# Patient Record
Sex: Male | Born: 1955
Health system: Southern US, Community
[De-identification: ages and names within clinical notes are randomized; demographics above are authoritative.]

## PROBLEM LIST (undated history)

## (undated) DIAGNOSIS — E785 Hyperlipidemia, unspecified: Secondary | ICD-10-CM

## (undated) DIAGNOSIS — G479 Sleep disorder, unspecified: Secondary | ICD-10-CM

## (undated) DIAGNOSIS — R7303 Prediabetes: Secondary | ICD-10-CM

## (undated) DIAGNOSIS — R0602 Shortness of breath: Secondary | ICD-10-CM

## (undated) DIAGNOSIS — Z87448 Personal history of other diseases of urinary system: Secondary | ICD-10-CM

## (undated) DIAGNOSIS — Z8719 Personal history of other diseases of the digestive system: Secondary | ICD-10-CM

## (undated) DIAGNOSIS — M549 Dorsalgia, unspecified: Secondary | ICD-10-CM

## (undated) DIAGNOSIS — M2550 Pain in unspecified joint: Secondary | ICD-10-CM

## (undated) DIAGNOSIS — K5900 Constipation, unspecified: Secondary | ICD-10-CM

## (undated) DIAGNOSIS — I10 Essential (primary) hypertension: Secondary | ICD-10-CM

## (undated) DIAGNOSIS — M1990 Unspecified osteoarthritis, unspecified site: Secondary | ICD-10-CM

## (undated) DIAGNOSIS — M5430 Sciatica, unspecified side: Secondary | ICD-10-CM

## (undated) HISTORY — PX: TOTAL HIP ARTHROPLASTY: SHX124

## (undated) HISTORY — DX: Constipation, unspecified: K59.00

## (undated) HISTORY — DX: Personal history of other diseases of urinary system: Z87.448

## (undated) HISTORY — DX: Sciatica, unspecified side: M54.30

## (undated) HISTORY — DX: Dorsalgia, unspecified: M54.9

## (undated) HISTORY — DX: Shortness of breath: R06.02

## (undated) HISTORY — PX: ROTATOR CUFF REPAIR: SHX139

## (undated) HISTORY — DX: Hyperlipidemia, unspecified: E78.5

## (undated) HISTORY — PX: VASECTOMY: SHX75

## (undated) HISTORY — DX: Pain in unspecified joint: M25.50

## (undated) HISTORY — DX: Prediabetes: R73.03

## (undated) HISTORY — DX: Essential (primary) hypertension: I10

---
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
## 2004-03-23 ENCOUNTER — Ambulatory Visit: Payer: Self-pay | Admitting: Internal Medicine

## 2004-03-31 ENCOUNTER — Ambulatory Visit: Payer: Self-pay | Admitting: Internal Medicine

## 2005-01-15 ENCOUNTER — Ambulatory Visit: Payer: Self-pay | Admitting: Internal Medicine

## 2005-04-24 ENCOUNTER — Ambulatory Visit (HOSPITAL_COMMUNITY): Admission: RE | Admit: 2005-04-24 | Discharge: 2005-04-24 | Payer: Self-pay | Admitting: Orthopedic Surgery

## 2005-05-10 ENCOUNTER — Ambulatory Visit: Payer: Self-pay | Admitting: Internal Medicine

## 2005-05-14 ENCOUNTER — Ambulatory Visit: Payer: Self-pay | Admitting: Internal Medicine

## 2006-09-12 ENCOUNTER — Ambulatory Visit: Payer: Self-pay | Admitting: Internal Medicine

## 2006-09-12 LAB — CONVERTED CEMR LAB
Albumin: 4.3 g/dL (ref 3.5–5.2)
Alkaline Phosphatase: 37 units/L — ABNORMAL LOW (ref 39–117)
Basophils Absolute: 0 10*3/uL (ref 0.0–0.1)
Basophils Relative: 0.7 % (ref 0.0–1.0)
Calcium: 9.4 mg/dL (ref 8.4–10.5)
Cholesterol: 305 mg/dL (ref 0–200)
Creatinine, Ser: 1.1 mg/dL (ref 0.4–1.5)
Eosinophils Absolute: 0.3 10*3/uL (ref 0.0–0.6)
Eosinophils Relative: 4.2 % (ref 0.0–5.0)
HCT: 45.1 % (ref 39.0–52.0)
Hemoglobin: 15.8 g/dL (ref 13.0–17.0)
MCV: 87.8 fL (ref 78.0–100.0)
Monocytes Absolute: 0.7 10*3/uL (ref 0.2–0.7)
Monocytes Relative: 10.7 % (ref 3.0–11.0)
Neutro Abs: 4.4 10*3/uL (ref 1.4–7.7)
Neutrophils Relative %: 64.4 % (ref 43.0–77.0)
PSA: 0.78 ng/mL (ref 0.10–4.00)
Potassium: 4.1 meq/L (ref 3.5–5.1)
Sodium: 139 meq/L (ref 135–145)
TSH: 2.13 microintl units/mL (ref 0.35–5.50)
Total Bilirubin: 1.5 mg/dL — ABNORMAL HIGH (ref 0.3–1.2)
Total CHOL/HDL Ratio: 7.6
Total Protein: 7.2 g/dL (ref 6.0–8.3)
VLDL: 65 mg/dL — ABNORMAL HIGH (ref 0–40)

## 2006-09-19 ENCOUNTER — Ambulatory Visit: Payer: Self-pay | Admitting: Internal Medicine

## 2006-10-18 ENCOUNTER — Ambulatory Visit: Payer: Self-pay | Admitting: Internal Medicine

## 2006-10-18 LAB — CONVERTED CEMR LAB
AST: 37 units/L (ref 0–37)
Cholesterol: 216 mg/dL (ref 0–200)
Creatinine, Ser: 1.1 mg/dL (ref 0.4–1.5)
HDL: 47.2 mg/dL (ref >39.0)
Total CHOL/HDL Ratio: 4.6
Triglycerides: 140 mg/dL (ref 0–149)

## 2006-12-17 ENCOUNTER — Encounter: Payer: Self-pay | Admitting: *Deleted

## 2006-12-17 DIAGNOSIS — G5600 Carpal tunnel syndrome, unspecified upper limb: Secondary | ICD-10-CM | POA: Insufficient documentation

## 2006-12-17 DIAGNOSIS — R454 Irritability and anger: Secondary | ICD-10-CM | POA: Insufficient documentation

## 2006-12-17 DIAGNOSIS — E785 Hyperlipidemia, unspecified: Secondary | ICD-10-CM | POA: Insufficient documentation

## 2006-12-17 DIAGNOSIS — I10 Essential (primary) hypertension: Secondary | ICD-10-CM | POA: Insufficient documentation

## 2007-09-07 ENCOUNTER — Telehealth: Payer: Self-pay | Admitting: Internal Medicine

## 2007-09-22 ENCOUNTER — Telehealth: Payer: Self-pay | Admitting: Internal Medicine

## 2007-09-25 ENCOUNTER — Telehealth: Payer: Self-pay | Admitting: Internal Medicine

## 2007-10-04 ENCOUNTER — Telehealth: Payer: Self-pay | Admitting: Internal Medicine

## 2007-10-12 ENCOUNTER — Ambulatory Visit: Payer: Self-pay | Admitting: Internal Medicine

## 2007-10-12 DIAGNOSIS — M5430 Sciatica, unspecified side: Secondary | ICD-10-CM

## 2007-10-12 HISTORY — DX: Sciatica, unspecified side: M54.30

## 2007-12-07 ENCOUNTER — Ambulatory Visit: Payer: Self-pay | Admitting: Internal Medicine

## 2007-12-07 ENCOUNTER — Telehealth: Payer: Self-pay | Admitting: Internal Medicine

## 2007-12-07 DIAGNOSIS — R079 Chest pain, unspecified: Secondary | ICD-10-CM | POA: Insufficient documentation

## 2007-12-11 ENCOUNTER — Encounter: Payer: Self-pay | Admitting: Internal Medicine

## 2007-12-11 ENCOUNTER — Ambulatory Visit: Payer: Self-pay

## 2007-12-14 ENCOUNTER — Telehealth: Payer: Self-pay | Admitting: Internal Medicine

## 2008-01-18 ENCOUNTER — Telehealth: Payer: Self-pay | Admitting: Internal Medicine

## 2008-01-18 ENCOUNTER — Ambulatory Visit: Payer: Self-pay | Admitting: Internal Medicine

## 2008-01-18 DIAGNOSIS — L02419 Cutaneous abscess of limb, unspecified: Secondary | ICD-10-CM | POA: Insufficient documentation

## 2008-01-18 DIAGNOSIS — L03119 Cellulitis of unspecified part of limb: Secondary | ICD-10-CM

## 2008-01-19 ENCOUNTER — Ambulatory Visit: Payer: Self-pay | Admitting: Internal Medicine

## 2008-01-21 ENCOUNTER — Encounter: Payer: Self-pay | Admitting: Internal Medicine

## 2008-01-25 ENCOUNTER — Telehealth: Payer: Self-pay | Admitting: Internal Medicine

## 2008-03-29 ENCOUNTER — Telehealth: Payer: Self-pay | Admitting: Internal Medicine

## 2008-04-08 ENCOUNTER — Telehealth: Payer: Self-pay | Admitting: Internal Medicine

## 2008-10-01 ENCOUNTER — Telehealth: Payer: Self-pay | Admitting: Internal Medicine

## 2008-11-05 ENCOUNTER — Telehealth: Payer: Self-pay | Admitting: Internal Medicine

## 2009-05-15 ENCOUNTER — Ambulatory Visit: Payer: Self-pay | Admitting: Internal Medicine

## 2009-06-09 ENCOUNTER — Telehealth: Payer: Self-pay | Admitting: Internal Medicine

## 2009-07-31 ENCOUNTER — Telehealth (INDEPENDENT_AMBULATORY_CARE_PROVIDER_SITE_OTHER): Payer: Self-pay | Admitting: *Deleted

## 2009-08-05 ENCOUNTER — Encounter: Payer: Self-pay | Admitting: Internal Medicine

## 2009-12-05 ENCOUNTER — Telehealth (INDEPENDENT_AMBULATORY_CARE_PROVIDER_SITE_OTHER): Payer: Self-pay | Admitting: *Deleted

## 2009-12-22 ENCOUNTER — Telehealth: Payer: Self-pay | Admitting: Internal Medicine

## 2010-03-18 ENCOUNTER — Encounter: Payer: Self-pay | Admitting: Internal Medicine

## 2010-03-18 ENCOUNTER — Ambulatory Visit (INDEPENDENT_AMBULATORY_CARE_PROVIDER_SITE_OTHER): Payer: BC Managed Care – PPO | Admitting: Internal Medicine

## 2010-03-18 DIAGNOSIS — M79609 Pain in unspecified limb: Secondary | ICD-10-CM | POA: Insufficient documentation

## 2010-03-18 NOTE — Progress Notes (Signed)
  Phone Note Refill Request Message from:  Patient on June 09, 2009 3:57 PM  Refills Requested: Medication #1:  CHANTIX STARTING MONTH PAK 0.5 MG X 11 & 1 MG X 42 TABS as directed. Pt wants to switch back to Crestor 40 mg  Initial call taken by: Ami Bullins CMA,  June 09, 2009 3:58 PM  Follow-up for Phone Call        he had the starter pak 3/31 and should now be on regular treatment dose of 1 mg two times a day, # 60 with 12 refills.  OK to switch back to crestor 40mg  once daily, #30 refill prn Follow-up by: Jacques Navy MD,  June 09, 2009 5:45 PM    New/Updated Medications: CRESTOR 40 MG TABS (ROSUVASTATIN CALCIUM) 1 by mouth qPM CHANTIX 1 MG TABS (VARENICLINE TARTRATE) Take 1 tablet by mouth two times a day Prescriptions: CHANTIX 1 MG TABS (VARENICLINE TARTRATE) Take 1 tablet by mouth two times a day  #60 x 12   Entered by:   Rock Nephew CMA   Authorized by:   Jacques Navy MD   Signed by:   Rock Nephew CMA on 06/10/2009   Method used:   Electronically to        [facility] (retail)       [address] [place], [place]  [address]       Ph: [phone]       Fax: [phone]   RxID:   [id number] CRESTOR 40 MG TABS (ROSUVASTATIN CALCIUM) 1 by mouth qPM  #30 x 12   Entered by:   Rock Nephew CMA   Authorized by:   Jacques Navy MD   Signed by:   Rock Nephew CMA on 06/10/2009   Method used:   Electronically to        [facility] (retail)       [address] [place], [place]  [address]       Ph: [phone]       Fax: [phone]   RxID:   [id number]

## 2010-03-18 NOTE — Progress Notes (Signed)
  Phone Note Other Incoming   Request: Send information Summary of Call: Request for records received from [facility]. Request forwarded to [facility].     

## 2010-03-18 NOTE — Progress Notes (Signed)
  Phone Note Other Incoming   Request: Send information Summary of Call: Request for records received from [facility]. Request forwarded to [facility].

## 2010-03-18 NOTE — Assessment & Plan Note (Signed)
Summary: FU-D/T-STC   Vital Signs:  Patient profile:   55 year old male Height:      69 inches (175.26 cm) Weight:      197.25 pounds (89.66 kg) BMI:     29.23 O2 Sat:      98 % on Room air Temp:     99.0 degrees F (37.22 degrees C) oral Pulse rate:   78 / minute Pulse rhythm:   regular BP sitting:   124 / 76  (left arm) Cuff size:   regular  Vitals Entered By: Bill Salinas CMA (May 15, 2009 12:09 PM)  O2 Flow:  Room air CC: pt here to discuss medications/wants to ask about setting up DOT phy/blood work/aj   Primary Care Provider:  Jacques Navy MD  CC:  pt here to discuss medications/wants to ask about setting up DOT phy/blood work/aj.  History of Present Illness: Wants to discuss his medications: would like generics where-ever possible. 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   He has no complaints: no respiratory, cardiac or GI symptoms or problems. He is still struggling with smoking cessation.   Current Medications (verified): 1)  Wellbutrin Sr 150 Mg  Tb12 (Bupropion Hcl) .... Take 2 Tablets Daily 2)  Alprazolam 1 Mg  Tabs (Alprazolam) .... Take One Tablet At Bedtime(Needs Office Visit Soon, Before Next Approval) 3)  Hydrochlorothiazide 50 Mg  Tabs (Hydrochlorothiazide) .... Take 1/2 Tablet Daily 4)  Enalapril Maleate 20 Mg  Tabs (Enalapril Maleate) .... Take One Tablet Once Daily 5)  Crestor 40 Mg Tabs (Rosuvastatin Calcium) .... 1/2 Once Daily 6)  Nitrostat 0.4 Mg Subl (Nitroglycerin) .Marland Kitchen.. 1 Sl Q 2 Min For Chest Pain 7)  Bactrim Ds 800-160 Mg Tabs (Sulfamethoxazole-Trimethoprim) .... Take 1 Tablet By Mouth Two Times A Day For 21 Days 8)  Lortab 7.5 7.5-500 Mg Tabs (Hydrocodone-Acetaminophen) .... One By Mouth Q6 Hours As Needed Pain  Allergies (verified): No Known Drug Allergies  Past History:  Past Medical History: Last  updated: 12/17/2006 ANXIETY (ICD-300.00) Hx of PARESTHESIA (ICD-782.0) HYPERTENSION (ICD-401.9) HYPERLIPIDEMIA (ICD-272.4)    Past Surgical History: Last updated: 12/17/2006 VASECTOMY, HX OF (ICD-V26.52)  Social History: Last updated: 01/18/2008 Divorced Works in Games developer Alcohol use-no Drug use-no Regular exercise-yes Current Smoker  Review of Systems  The patient denies anorexia, fever, weight loss, weight gain, chest pain, dyspnea on exertion, prolonged cough, abdominal pain, severe indigestion/heartburn, incontinence, muscle weakness, difficulty walking, unusual weight change, and enlarged lymph nodes.    Physical Exam  General:  alert, well-developed, well-nourished, and well-hydrated.   Head:  normocephalic and atraumatic.   Eyes:  corneas and lenses clear and no injection.   Neck:  supple.   Lungs:  normal respiratory effort and normal breath sounds.   Heart:  normal rate and regular rhythm.   Msk:  normal ROM and no joint deformities.   Pulses:  2+ radial Neurologic:  alert & oriented X3 and cranial nerves II-XII intact.   Skin:  turgor normal, color normal, and no rashes.   Psych:  Oriented X3, memory intact for recent and remote, normally interactive, and good eye contact.     Impression & Recommendations:  Problem # 1:  ANXIETY (ICD-300.00) He did not see any benefit is taking wellbutrin and had discontinued medication . He feels he is doing well  with alprazolam.  The following medications were removed from the medication list:    Wellbutrin Sr 150 Mg Tb12 (Bupropion hcl) .Marland Kitchen... Take 2 tablets daily His updated medication list for this problem includes:    Alprazolam 1 Mg Tabs (Alprazolam) .Marland Kitchen... Take one tablet at bedtime(needs office visit soon, before next approval)  Problem # 2:  HYPERTENSION (ICD-401.9)  His updated medication list for this problem includes:    Hydrochlorothiazide 50 Mg Tabs (Hydrochlorothiazide) .Marland Kitchen... Take 1/2 tablet  daily    Enalapril Maleate 20 Mg Tabs (Enalapril maleate) .Marland Kitchen... Take one tablet once daily  BP today: 124/76 Prior BP: 106/70 (01/19/2008)  Good control on present medications, which are generics.  Problem # 3:  HYPERLIPIDEMIA (ICD-272.4) Has a h/o very cholesterol. He has been off medication for some time.  Plan - resume statin therapy with Lipitor           follow-up lab in 4 weeks.   His updated medication list for this problem includes:    Lipitor 40 Mg Tabs (Atorvastatin calcium) .Marland Kitchen... 1 by mouth q pm  Problem # 4:  Preventive Health Care (ICD-V70.0) Seems to be in good health. He will get a DOT exam through the driving school - which will not include a prostate exam.   Complete Medication List: 1)  Alprazolam 1 Mg Tabs (Alprazolam) .... Take one tablet at bedtime(needs office visit soon, before next approval) 2)  Hydrochlorothiazide 50 Mg Tabs (Hydrochlorothiazide) .... Take 1/2 tablet daily 3)  Enalapril Maleate 20 Mg Tabs (Enalapril maleate) .... Take one tablet once daily 4)  Lipitor 40 Mg Tabs (Atorvastatin calcium) .Marland Kitchen.. 1 by mouth q pm 5)  Nitrostat 0.4 Mg Subl (Nitroglycerin) .Marland Kitchen.. 1 sl q 2 min for chest pain 6)  Chantix Starting Month Pak 0.5 Mg X 11 & 1 Mg X 42 Tabs (Varenicline tartrate) .... As directed Prescriptions: ALPRAZOLAM 1 MG  TABS (ALPRAZOLAM) Take one tablet at bedtime(needs office visit soon, before next approval)  #30 x 5   Entered and Authorized by:   Jacques Navy MD   Signed by:   Jacques Navy MD on 05/15/2009   Method used:   Print then Give to Patient   RxID:   [id number] NITROSTAT 0.4 MG SUBL (NITROGLYCERIN) 1 sl q 2 min for chest pain  #20 x 2   Entered and Authorized by:   Jacques Navy MD   Signed by:   Jacques Navy MD on 05/15/2009   Method used:   Electronically to        [facility] (retail)       [address] [place], [place]  [address]       Ph: [phone]       Fax:  [phone]   RxID:   [id number] ENALAPRIL MALEATE 20 MG  TABS (ENALAPRIL MALEATE) Take one tablet once daily  #30 Tablet x 12   Entered and Authorized by:   Jacques Navy MD   Signed by:   Jacques Navy MD on 05/15/2009   Method used:   Electronically to        [facility] (retail)       [address] [place], [place]  [address]       Ph: [phone]  Fax: [phone]   RxID:   [id number] HYDROCHLOROTHIAZIDE 50 MG  TABS (HYDROCHLOROTHIAZIDE) Take 1/2 tablet daily  #100 Tablet x 2   Entered and Authorized by:   Jacques Navy MD   Signed by:   Jacques Navy MD on 05/15/2009   Method used:   Electronically to        [facility] (retail)       [address] [place], [place]  [address]       Ph: [phone]       Fax: [phone]   RxID:   [id number] CHANTIX STARTING MONTH PAK 0.5 MG X 11 & 1 MG X 42 TABS (VARENICLINE TARTRATE) as directed  #1 x 0   Entered and Authorized by:   Jacques Navy MD   Signed by:   Jacques Navy MD on 05/15/2009   Method used:   Electronically to        [facility] (retail)       [address] [place], [place]  [address]       Ph: [phone]       Fax: [phone]   RxID:   [id number] LIPITOR 40 MG TABS (ATORVASTATIN CALCIUM) 1 by mouth q PM  #30 x 12   Entered and Authorized by:   Jacques Navy MD   Signed by:   Jacques Navy MD on 05/15/2009   Method used:   Electronically to        [facility] (retail)       [address] [place], [place]  [address]       Ph: [phone]       Fax: [phone]   RxID:   [id number]

## 2010-03-18 NOTE — Progress Notes (Signed)
  Phone Note Refill Request Message from:  Fax from Pharmacy on December 22, 2009 1:52 PM  Refills Requested: Medication #1:  ALPRAZOLAM 1 MG  TABS Take one tablet at bedtime(needs office visit soon   Last Refilled: 11/08/2009 received refill request from [facility]. Please Advise refills  Initial call taken by: Ami Bullins CMA,  December 22, 2009 1:53 PM  Follow-up for Phone Call        ok to refill x 5 Follow-up by: Jacques Navy MD,  December 22, 2009 2:10 PM    Prescriptions: ALPRAZOLAM 1 MG  TABS (ALPRAZOLAM) Take one tablet at bedtime(needs office visit soon, before next approval)  #30 x 5   Entered by:   Ami Bullins CMA   Authorized by:   Jacques Navy MD   Signed by:   Bill Salinas CMA on 12/22/2009   Method used:   Telephoned to ...       [facility] [phone]* (retail)       [address] [place], [place]  [address]       Ph: [phone]       Fax: [phone]   RxID:   [id number]

## 2010-03-20 ENCOUNTER — Encounter: Payer: Self-pay | Admitting: Internal Medicine

## 2010-03-25 NOTE — Assessment & Plan Note (Signed)
Summary: surgery clearance per pt--d/t---stc   Vital Signs:  Patient profile:   55 year old male Height:      69 inches Weight:      199 pounds BMI:     29.49 O2 Sat:      95 % on Room air Temp:     98.4 degrees F oral Pulse rate:   80 / minute BP sitting:   118 / 72  (left arm) Cuff size:   regular  Vitals Entered By: Bill Salinas CMA (March 18, 2010 9:15 AM)  O2 Flow:  Room air CC: pt here for surgical clearance/ab   Primary Care Provider:  Jacques Navy MD  CC:  pt here for surgical clearance/ab.  History of Present Illness: Patient presents for possible THR right. He has  been having a lot of leg pain, knee pain and foot pain. He has also had  back pain. He had x-rays of back and hips which have revealed DJD right hip with loss of joint space and DDD of the lumbar. He knows he has changed his gait. He has limited his activities. He is not in a hurry to have THR.   Current Medications (verified): 1)  Alprazolam 1 Mg  Tabs (Alprazolam) .... Take One Tablet At Bedtime(Needs Office Visit Soon, Before Next Approval) 2)  Hydrochlorothiazide 50 Mg  Tabs (Hydrochlorothiazide) .... Take 1/2 Tablet Daily 3)  Enalapril Maleate 20 Mg  Tabs (Enalapril Maleate) .... Take One Tablet Once Daily 4)  Crestor 40 Mg Tabs (Rosuvastatin Calcium) .Marland Kitchen.. 1 By Mouth Qpm 5)  Nitrostat 0.4 Mg Subl (Nitroglycerin) .Marland Kitchen.. 1 Sl Q 2 Min For Chest Pain  Allergies (verified): 1)  ! 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    Physical Exam  General:  Well-developed,well-nourished,in no acute distress; alert,appropriate and cooperative throughout examination Head:  Normocephalic and atraumatic without obvious abnormalities. No apparent alopecia or balding. Eyes:  No corneal or conjunctival inflammation noted. EOMI. Perrla. Funduscopic exam benign, without hemorrhages, exudates or papilledema. Vision grossly normal. Ears:  cerumen  bilaterally. Mouth:  Oral mucosa and oropharynx without lesions or exudates.  Teeth in good repair. Neck:  supple, full ROM, no masses, no thyromegaly, and no carotid bruits.   Chest Wall:  No deformities, masses, tenderness or gynecomastia noted. Lungs:  Normal respiratory effort, chest expands symmetrically. Lungs are clear to auscultation, no crackles or wheezes. Heart:  Normal rate and regular rhythm. S1 and S2 normal without gallop, murmur, click, rub or other extra sounds. Abdomen:  soft and normal bowel sounds.   Msk:  able to get up to exam without assistance. painful to cross legs. Nl SLR supine. Nl DTRs at patellar tendon. Tenderness with external rotation of the right hip. Some tenderness at the extreme of internal rotation. Pain with pressure agaist the greater trochanter. Very tender at the psoas tendon right.  Pulses:  2+ radial Neurologic:  alert & oriented X3 and cranial nerves II-XII intact.   Skin:  turgor normal and  color normal.   Psych:  Oriented X3, memory intact for recent and remote, normally interactive, and good eye contact.     Impression & Recommendations:  Problem # 1:  LEG PAIN, RIGHT (ICD-729.5) Patient reports DJD by x-ray of the right hip. Also reports that he has DDD lumbar spine. He has great pain in the leg: quads, psoas tendon. Exam does not suggest  radiculopathy related to DDD lumbar spine. He does have signs of hip disease but also piriformis syndrome, psoas tendonopathy and inflammation of the quads.  Plan - directed to review videos on YouTube for sciatica, piriformis syndrome        provided referral slip to [facility]       Keep appointment with Dr. Charlann Boxer for full evaluation and suitability for ESI, trochanteric steroids or possible THR.  Problem # 2:  HYPERTENSION (ICD-401.9)  His updated medication list for this problem includes:    Hydrochlorothiazide 50 Mg Tabs (Hydrochlorothiazide) .Marland Kitchen... Take 1/2 tablet daily    Enalapril Maleate 20 Mg Tabs (Enalapril maleate) .Marland Kitchen... Take one tablet once daily  BP today: 118/72 Prior BP: 124/76 (05/15/2009)  Great control on present meds. OUtside labs are ok.   Problem # 3:  HYPERLIPIDEMIA (ICD-272.4) Outside labs with cholesterol of 212; HDL 67, LDL 129  Plan - continue crestor  His updated medication list for this problem includes:    Crestor 40 Mg Tabs (Rosuvastatin calcium) .Marland Kitchen... 1 by mouth qpm  Problem # 4:  Preventive Health Care (ICD-V70.0) History unremarkable except for leg pain. Lab outside with A1C 5.4%; nl renal function and liver functions. 12 lead EKG without signs of ischemia.   He is cleared for any potential surgery or vigorous PT  Complete Medication List: 1)  Alprazolam 1 Mg Tabs (Alprazolam) .... Take one tablet at bedtime(needs office visit soon, before next approval) 2)  Hydrochlorothiazide 50 Mg Tabs (Hydrochlorothiazide) .... Take 1/2 tablet daily 3)  Enalapril Maleate 20 Mg Tabs (Enalapril maleate) .... Take one tablet once daily 4)  Crestor 40 Mg Tabs (Rosuvastatin calcium) .Marland Kitchen.. 1 by mouth qpm 5)  Nitrostat 0.4 Mg Subl (Nitroglycerin) .Marland Kitchen.. 1 sl q 2 min for chest pain   Orders Added: 1)  Est. Patient Level IV [[address]]

## 2010-04-08 NOTE — Consult Note (Signed)
Summary: [facility]   Imported By: Sherian Rein 04/01/2010 07:51:52  _____________________________________________________________________  External Attachment:    Type:   Image     Comment:   External Document

## 2010-05-12 ENCOUNTER — Encounter (HOSPITAL_COMMUNITY): Payer: BC Managed Care – PPO

## 2010-05-12 ENCOUNTER — Other Ambulatory Visit: Payer: Self-pay | Admitting: Orthopedic Surgery

## 2010-05-12 ENCOUNTER — Other Ambulatory Visit (HOSPITAL_COMMUNITY): Payer: Self-pay | Admitting: Orthopedic Surgery

## 2010-05-12 ENCOUNTER — Ambulatory Visit (HOSPITAL_COMMUNITY)
Admission: RE | Admit: 2010-05-12 | Discharge: 2010-05-12 | Disposition: A | Discharge status: Home / Self Care | Payer: BC Managed Care – PPO | Source: Ambulatory Visit | Attending: Orthopedic Surgery | Admitting: Orthopedic Surgery

## 2010-05-12 DIAGNOSIS — Z01811 Encounter for preprocedural respiratory examination: Secondary | ICD-10-CM | POA: Insufficient documentation

## 2010-05-12 DIAGNOSIS — I10 Essential (primary) hypertension: Secondary | ICD-10-CM

## 2010-05-12 DIAGNOSIS — M169 Osteoarthritis of hip, unspecified: Secondary | ICD-10-CM | POA: Insufficient documentation

## 2010-05-12 DIAGNOSIS — F17200 Nicotine dependence, unspecified, uncomplicated: Secondary | ICD-10-CM | POA: Insufficient documentation

## 2010-05-12 DIAGNOSIS — M1610 Unilateral primary osteoarthritis, unspecified hip: Secondary | ICD-10-CM | POA: Insufficient documentation

## 2010-05-12 DIAGNOSIS — Z01818 Encounter for other preprocedural examination: Secondary | ICD-10-CM | POA: Insufficient documentation

## 2010-05-12 DIAGNOSIS — Z01812 Encounter for preprocedural laboratory examination: Secondary | ICD-10-CM | POA: Insufficient documentation

## 2010-05-12 DIAGNOSIS — M1990 Unspecified osteoarthritis, unspecified site: Secondary | ICD-10-CM

## 2010-05-12 LAB — DIFFERENTIAL
Basophils Absolute: 0 10*3/uL (ref 0.0–0.1)
Basophils Relative: 0 % (ref 0–1)
Eosinophils Absolute: 0.2 10*3/uL (ref 0.0–0.7)
Eosinophils Relative: 3 % (ref 0–5)
Lymphs Abs: 1.4 10*3/uL (ref 0.7–4.0)
Monocytes Absolute: 0.6 10*3/uL (ref 0.1–1.0)
Neutro Abs: 4 10*3/uL (ref 1.7–7.7)

## 2010-05-12 LAB — URINALYSIS, ROUTINE W REFLEX MICROSCOPIC
Bilirubin Urine: NEGATIVE
Ketones, ur: NEGATIVE mg/dL
Protein, ur: NEGATIVE mg/dL
Specific Gravity, Urine: 1.012 (ref 1.005–1.030)
Urobilinogen, UA: 0.2 mg/dL (ref 0.0–1.0)

## 2010-05-12 LAB — BASIC METABOLIC PANEL
Calcium: 9.6 mg/dL (ref 8.4–10.5)
Chloride: 104 mEq/L (ref 96–112)
Creatinine, Ser: 0.91 mg/dL (ref 0.4–1.5)
GFR calc non Af Amer: >60 mL/min (ref >60)
Potassium: 4.5 mEq/L (ref 3.5–5.1)

## 2010-05-12 LAB — PROTIME-INR: Prothrombin Time: 12.9 seconds (ref 11.6–15.2)

## 2010-05-12 LAB — CBC
HCT: 40.4 % (ref 39.0–52.0)
Hemoglobin: 13.9 g/dL (ref 13.0–17.0)
MCHC: 34.4 g/dL (ref 30.0–36.0)
MCV: 86.5 fL (ref 78.0–100.0)
RDW: 12.8 % (ref 11.5–15.5)

## 2010-05-12 LAB — SURGICAL PCR SCREEN
MRSA, PCR: NEGATIVE
Staphylococcus aureus: POSITIVE — AB

## 2010-05-18 NOTE — H&P (Signed)
NAME:  Eric Delgado, Eric Delgado NO.:  [id number]  MEDICAL RECORD NO.:  [id number]          PATIENT TYPE:  LOCATION:                                 FACILITY:  PHYSICIAN:  Madlyn Frankel. Charlann Boxer, M.D.  DATE OF BIRTH:  [birth year]  DATE OF ADMISSION: DATE OF DISCHARGE:                             HISTORY & PHYSICAL   CHIEF COMPLAINT:  Right hip osteoarthritis.  HISTORY OF PRESENT ILLNESS:  This is a 55 year old gentleman with a history of osteoarthritis of his right hip.  He is having pain and decreased range of motion and wishes to proceed with total hip arthroplasty.  After discussion of treatments, benefits, risks, and options, the patient is now for anterior total hip arthroplasty of the right hip.  He has no history of coronary problems and is a candidate for trans-cinnamic acid and will get that in the preop holding area.  PAST MEDICAL HISTORY:  Drug allergies to CODEINE with itching.  CURRENT MEDICATIONS: 1. Alprazolam 1 mg at bedtime p.r.n. 2. Hydrochlorothiazide 25 mg 1 daily. 3. Enalapril 20 mg 1 daily. 4. Crestor 20 mg 1 daily.  SERIOUS MEDICAL ILLNESSES:  Anxiety, hypertension, and hyperlipidemia.  PAST SURGICAL HISTORY:  Shoulder rotator cuff repair, appendectomy, and left thumb.  FAMILY HISTORY:  Positive for hypertension.  SOCIAL HISTORY:  The patient is divorced.  He lives at home.  He smokes 3-4 cigarettes a day and drinks 4 beers per day.  He works as a Corporate investment banker.  REVIEW OF SYSTEMS:  CENTRAL NERVOUS SYSTEM:  Negative for headache, blurred vision, or dizziness.  Positive for anxiety.  PULMONARY: Negative for shortness of breath, PND, or orthopnea.  CARDIOVASCULAR: Negative for heart attack, coronary artery disease.  Positive for hypertension and elevated cholesterol.  GI:  Negative for ulcers and hepatitis.  GU:  Negative for urinary tract difficulties. MUSCULOSKELETAL:  Positive as in HPI.  Note that, the patient has no chest pains and  no history of cardiac problems.  PHYSICAL EXAMINATION:  VITAL SIGNS:  BP 120/80, respirations 16, pulse 72 and regular. GENERAL:  This is a well-developed and well-nourished gentleman in no acute distress. HEENT:  Head normocephalic.  Nose patent.  Ears patent.  Pupils equal, round, and reactive to light.  Throat without injection. NECK:  Supple without adenopathy.  Carotids 2+ without bruit. CHEST:  Clear to auscultation.  No rales or rhonchi.  Respirations 16, heart regular rate and rhythm at 72 beats per minute without murmur. ABDOMEN:  Soft with active bowel sounds.  No masses or organomegaly. NEUROLOGIC:  The patient is alert and oriented to time, place, and person.  Cranial nerves II-XII grossly intact. EXTREMITIES:  The right hip with 0-95 degree range of motion, 35 degrees external rotation, 10 degrees of internal rotation with pain at the extremes of motion.  Neurovascular status is intact.  IMPRESSION:  Osteoarthritis, right hip.  PLAN:  Anterior total hip arthroplasty, right hip.     Jaquelyn Bitter. Chabon, P.A.   ______________________________ Madlyn Frankel Charlann Boxer, M.D.    SJC/MEDQ  D:  04/28/2010  T:  04/29/2010  Job:  [id number]  Electronically Signed by Jodene Nam  P.A. on 05/18/2010 09:55:23 AM Electronically Signed by Durene Romans M.D. on 05/18/2010 12:15:41 PM

## 2010-05-19 ENCOUNTER — Inpatient Hospital Stay (HOSPITAL_COMMUNITY): Payer: BC Managed Care – PPO

## 2010-05-19 ENCOUNTER — Inpatient Hospital Stay (HOSPITAL_COMMUNITY)
Admission: RE | Admit: 2010-05-19 | Discharge: 2010-05-21 | DRG: 818 | Disposition: A | Discharge status: Home Health | Payer: BC Managed Care – PPO | Source: Ambulatory Visit | Attending: Orthopedic Surgery | Admitting: Orthopedic Surgery

## 2010-05-19 DIAGNOSIS — M169 Osteoarthritis of hip, unspecified: Principal | ICD-10-CM | POA: Diagnosis present

## 2010-05-19 DIAGNOSIS — M1610 Unilateral primary osteoarthritis, unspecified hip: Principal | ICD-10-CM | POA: Diagnosis present

## 2010-05-19 DIAGNOSIS — F17200 Nicotine dependence, unspecified, uncomplicated: Secondary | ICD-10-CM | POA: Diagnosis present

## 2010-05-19 DIAGNOSIS — E7800 Pure hypercholesterolemia, unspecified: Secondary | ICD-10-CM | POA: Diagnosis present

## 2010-05-19 DIAGNOSIS — Z01812 Encounter for preprocedural laboratory examination: Secondary | ICD-10-CM

## 2010-05-19 DIAGNOSIS — F411 Generalized anxiety disorder: Secondary | ICD-10-CM | POA: Diagnosis present

## 2010-05-19 DIAGNOSIS — I10 Essential (primary) hypertension: Secondary | ICD-10-CM | POA: Diagnosis present

## 2010-05-19 LAB — ABO/RH: ABO/RH(D): A NEG

## 2010-05-19 LAB — TYPE AND SCREEN: Antibody Screen: NEGATIVE

## 2010-05-20 LAB — BASIC METABOLIC PANEL
BUN: 10 mg/dL (ref 6–23)
Chloride: 105 mEq/L (ref 96–112)
Creatinine, Ser: 0.97 mg/dL (ref 0.4–1.5)
Glucose, Bld: 119 mg/dL — ABNORMAL HIGH (ref 70–99)
Potassium: 4.3 mEq/L (ref 3.5–5.1)

## 2010-05-20 LAB — CBC
Hemoglobin: 10 g/dL — ABNORMAL LOW (ref 13.0–17.0)
MCH: 29.5 pg (ref 26.0–34.0)
MCHC: 33.9 g/dL (ref 30.0–36.0)
MCV: 87 fL (ref 78.0–100.0)
RDW: 12.9 % (ref 11.5–15.5)

## 2010-05-21 LAB — BASIC METABOLIC PANEL WITH GFR
BUN: 9 mg/dL (ref 6–23)
CO2: 27 meq/L (ref 19–32)
Calcium: 8.4 mg/dL (ref 8.4–10.5)
Chloride: 100 meq/L (ref 96–112)
Creatinine, Ser: 0.9 mg/dL (ref 0.4–1.5)
GFR calc non Af Amer: >60 mL/min
Glucose, Bld: 107 mg/dL — ABNORMAL HIGH (ref 70–99)
Potassium: 4 meq/L (ref 3.5–5.1)
Sodium: 134 meq/L — ABNORMAL LOW (ref 135–145)

## 2010-05-21 LAB — CBC
HCT: 29.8 % — ABNORMAL LOW (ref 39.0–52.0)
Hemoglobin: 10 g/dL — ABNORMAL LOW (ref 13.0–17.0)
MCH: 29.2 pg (ref 26.0–34.0)
MCHC: 33.6 g/dL (ref 30.0–36.0)
MCV: 86.9 fL (ref 78.0–100.0)
Platelets: 189 K/uL (ref 150–400)
RBC: 3.43 MIL/uL — ABNORMAL LOW (ref 4.22–5.81)
RDW: 13.1 % (ref 11.5–15.5)
WBC: 7.7 K/uL (ref 4.0–10.5)

## 2010-05-23 NOTE — Op Note (Signed)
NAME:  Eric Delgado, HUE NO.:  [id number]  MEDICAL RECORD NO.:  [id number]           PATIENT TYPE:  I  LOCATION:  0006                         FACILITY:  [facility]  PHYSICIAN:  Madlyn Frankel. Charlann Boxer, M.D.  DATE OF BIRTH:  05-08-1955  DATE OF PROCEDURE:  05/19/2010 DATE OF DISCHARGE:                              OPERATIVE REPORT   PREOPERATIVE DIAGNOSIS:  Right hip osteoarthritis.  POSTOPERATIVE DIAGNOSIS:  Right hip osteoarthritis.  PROCEDURE:  Right total-hip replacement.  COMPONENTS USED:  DePuy hip system, size 56 pinnacle cup, single cancellous screw, a 36 +4 neutral Altrex liner and a size 6 standard Trilock stem with a 36 +1.5 Delta ceramic ball.  SURGEON:  Madlyn Frankel. Charlann Boxer, M.D.  ASSISTANT:  Jaquelyn Bitter. Chabon, P.A.  ANESTHESIA:  Spinal.  SPECIMENS:  None.  COMPLICATIONS:  None.  DRAINS:  One Hemovac.  ESTIMATED BLOOD LOSS:  700 cc.  INDICATIONS FOR THE PROCEDURE:  [name] is a 55 year old male who presented to the office for evaluation of bilateral hip pain, right greater than the left.  Radiographs revealed end-stage degenerative changes.  After reviewing with him the fact that conservative measures would most likely not provide any significant relief and his quality of life was diminished due to his pain, he wished to proceed with joint arthroplasty.  The risks of infection, DVT, component failure, dislocation, and need for revision surgery were all discussed in this 16- year-old male.  Consent was obtained for a right total-hip replacement through an anterior approach.  PROCEDURE IN DETAIL:  The patient was brought to the operative theater. Once adequate anesthesia, preoperative antibiotics, Ancef administered, he was positioned supine on the [employer] table.  Once positioned adequately, the right hip was pre draped out and fluoroscopy brought in to identify landmarks and evaluate perioperatively.  At this point, the right hip was prepped and  draped in the sterile fashion from proximal iliac crest to mid-thigh area.  A time-out was performed identifying the patient, planned procedure and the extremity.  An incision was made 2 cm distal and lateral to the anterior-superior iliac spine and extending over the anterior aspect of the trochanter. The fascia of the tensor fascia lata was identified and soft-tissue planes created.  The fascia was incised and then the tensor fascia muscle elevated off of the undersurface of the fascia and swept laterally.  Retractors were placed superiorly.  At this point, the anterior circumflex vessels were identified and cauterized and pericapsular fat tissue was removed.  Following elevating the rectus femoris, retractors were placed inferior on the neck.  An L capsulotomy was then made in the superior aspect of the neck and carried to the trochanteric fossa down to the lesser trochanter.  Stay sutures were applied and the retractor placed intracapsular.  With the neck of the femur exposed, fluoroscopy was brought in to identify the orientation of the neck cut.  Once this was confirmed in location, traction was applied and a neck osteotomy made.  The femoral head was then removed without complication or damage to the muscle.  Traction was taken off and retractors were placed anterior and posterior.  Labrum and foveal tissue debrided.  I began reaming with a 48 reamer due to the size of the femoral head and reamed up to a 55 reamer under fluoroscopic imaging at times to confirm the orientation of reaming and position.  The final 56 pinnacle cup was chosen and impacted under the guidance of fluoroscopy to confirm its orientation in regard to abduction.  There was about 15 degrees of anteversion placed into this cup.  A single cancellus screw was placed, a hole eliminator placed, and the final 36 +4 neutral Altrex liner impacted.  At this point, the femur was rotated out about 80 degrees as I  removed some inferior capsular tissue off the neck.  The lateral hook was placed noting the mobility of the femur.  At this point, the femur was rotated to about 100 degrees and a medial retractor placed on the neck.  At this point, the proximal capsular tissue was opened up more to allow for retractor placement over the lateral trochanter.  With the proximal femur exposed and minor releases carried out to expose the proximal femur, box osteotome was inserted followed by the starting broach.  With the starting broach in place, I confirmed the orientation of the stem in the AP and lateral planes.  At this point, I began broaching.  I broached up initially to a size 5. When I had the size 5 broach in place there was no torsional movement. The stem appeared to be positioned within this femur where it wanted to lie.  I initially tried with a high offset neck, but after a couple of attempts of trying to reduce the hip, there was too much tension on the soft tissue so I felt that this offset was too much and I went to the standard neck length.  With this, the hip was reduced.  I felt that, on x-ray imaging, that the leg lengths were very comparable.  Given the fact that his left hip was arthritic as well and it was going to need to be replaced at some point, he was within a couple of millimeters of the appropriate length.  Given these parameters and also fluoroscopically identified that I would be able to get the size 6 broach in place, we dislocated the hip and removed the trial components.  I broached up to a size 6 and then chose a 6 standard stem.  The final 6 standard stem was then impacted and it sat at the level of the neck cut where the final broach had laid.  Based on this and the trial reductions, I chose a 36 +1.5 Delta ceramic ball.  This was impacted onto a dry trunnion and the hip then reduced.  The hip was stable to 80 degrees of external rotation and stable to 40 degrees  of internal rotation with tightness of the capsular tissues. Given all of these findings, the hip was irrigated again, as it had been throughout the case.  I reapproximated the anterior capsular tissue to itself using #1 Vicryl.  A medium Hemovac drain was placed deep.  The fascia of the tensor fascia lata muscle was then reapproximated using #1 Vicryl.  The remaining of the wound was closed with 2-0 Vicryl and running 4-0 Monocryl.  The hip was cleaned, dried and dressed sterilely with Dermabond and an Aquacel dressing.  The drain site was dressed separately.  He was then brought to the recovery room in stable condition tolerating the procedure well.     Madlyn Frankel  Charlann Boxer, M.D.     MDO/MEDQ  D:  05/19/2010  T:  05/19/2010  Job:  [id number]  Electronically Signed by Durene Romans M.D. on 05/23/2010 10:00:18 AM

## 2010-05-23 NOTE — Discharge Summary (Signed)
  NAME:  Eric Delgado, Eric Delgado NO.:  [id number]  MEDICAL RECORD NO.:  [id number]           PATIENT TYPE:  I  LOCATION:  1603                         FACILITY:  [facility]  PHYSICIAN:  Madlyn Frankel. Charlann Boxer, M.D.  DATE OF BIRTH:  May 02, 1955  DATE OF ADMISSION:  05/19/2010 DATE OF DISCHARGE:  05/21/2010                              DISCHARGE SUMMARY   ADMITTING DIAGNOSIS:  Right hip osteoarthritis.  DISCHARGE DIAGNOSES: 1. Right hip osteoarthritis. 2. Hypertension. 3. Hypercholesterolemia. 4. Anxiety.  ADMITTING HISTORY:  Eric Delgado is a 55 year old male who presented to the office for evaluation of right hip pain.  Radiographs revealed significant end-stage degenerative changes of right hip and fairly advanced left hip arthritis without much pain.  He wished at this point to proceed with hip arthroplasty.  We reviewed the risks, benefits, pros and cons of hip replacement surgery including decent discussion about bearing surfaces and approach.  He at this point was consented for an anterior approach.  HOSPITAL COURSE:  The patient admitted for same-day surgery on May 19, 2010.  He underwent a right total hip replacement through anterior approach.  Please see dictated operative note for full details of the procedure.  Postoperatively, he was seen and evaluated by Physical Therapy, was weightbearing as tolerated.  His Foley catheter and Hemovac drain were removed on postoperative day #1.  He was placed on a regular diet.  His labs remained stable with hematocrit 29.8 on postop day #2, just stable from day #1 at 29.5.  His electrolytes remained stable.  He did have some minor drainage from his drain site but otherwise was doing fine.  He was tolerating his regular diet.  By postop day #2, he was ready to go home.  DISCHARGE INSTRUCTIONS:  The patient will be discharged to home with home health physical therapy and work on gait training and strengthening.  He will allow his  muscles and bone to heal, try to take things as easy as possible.  We will plan at this point to see him back in the office in 2 weeks' time, our office number is [phone].  He will be on a regular diet.  DISCHARGE MEDICATIONS:  His discharge medications include: 1. Colace 100 mg p.o. b.i.d. as needed for constipation while on pain     medicine. 2. Iron 325 mg p.o. 2 to 3 times a day for week or two as tolerated. 3. Oxycodone 5 mg 1 to 2 tablets every 4 to 6 hours as needed for     pain. 4. MiraLax 17 g p.o. daily as needed for constipation. 5. Senokot-S 1 tablet every night as needed for constipation. 6. Crestor 20 mg q.h.s. 7. Enalapril 20 mg daily. 8. Hydrochlorothiazide 25 mg daily. 9. Ibuprofen as needed. 10.Multivitamins as normally taken. 11.Xanax 1 mg 1/2 to 1 tablet at night as needed.  Questions were encouraged, answered and reviewed at the time of discharge.     Madlyn Frankel Charlann Boxer, M.D.     MDO/MEDQ  D:  05/21/2010  T:  05/22/2010  Job:  [id number]  Electronically Signed by Durene Romans M.D. on 05/23/2010 10:00:23 AM

## 2010-06-05 ENCOUNTER — Other Ambulatory Visit: Payer: Self-pay | Admitting: Internal Medicine

## 2010-07-20 ENCOUNTER — Other Ambulatory Visit: Payer: Self-pay | Admitting: Internal Medicine

## 2010-08-24 ENCOUNTER — Telehealth: Payer: Self-pay | Admitting: *Deleted

## 2010-08-24 ENCOUNTER — Other Ambulatory Visit: Payer: Self-pay | Admitting: Internal Medicine

## 2010-08-24 MED ORDER — ALPRAZOLAM 1 MG PO TABS: 1.0000 mg | ORAL_TABLET | Freq: Every evening | ORAL | Status: DC | PRN

## 2010-08-24 NOTE — Telephone Encounter (Signed)
Refill request for Alprazolam 1 mg SIG take 1 tablet by mouth daily at bedtime Last filled 07/20/2010 with QTY 30. Please Advise

## 2010-08-24 NOTE — Telephone Encounter (Signed)
Ok for refill x 5 

## 2010-09-04 ENCOUNTER — Other Ambulatory Visit: Payer: Self-pay

## 2010-09-04 NOTE — Telephone Encounter (Signed)
rx request for  chantix 1 mg; please advise

## 2010-09-05 NOTE — Telephone Encounter (Signed)
Ov please

## 2010-09-08 NOTE — Telephone Encounter (Signed)
Called pt no answer and cell is not a working number. Informed pharmacy that pt will need appointment before medication can be prescribed

## 2010-10-08 ENCOUNTER — Encounter: Payer: Self-pay | Admitting: Internal Medicine

## 2010-10-08 DIAGNOSIS — Z0000 Encounter for general adult medical examination without abnormal findings: Secondary | ICD-10-CM

## 2010-10-08 DIAGNOSIS — Z0389 Encounter for observation for other suspected diseases and conditions ruled out: Secondary | ICD-10-CM

## 2010-10-09 ENCOUNTER — Other Ambulatory Visit: Payer: BC Managed Care – PPO

## 2010-10-09 ENCOUNTER — Other Ambulatory Visit (INDEPENDENT_AMBULATORY_CARE_PROVIDER_SITE_OTHER): Payer: BC Managed Care – PPO

## 2010-10-09 DIAGNOSIS — Z0000 Encounter for general adult medical examination without abnormal findings: Secondary | ICD-10-CM

## 2010-10-09 DIAGNOSIS — Z0389 Encounter for observation for other suspected diseases and conditions ruled out: Secondary | ICD-10-CM

## 2010-10-09 LAB — LIPID PANEL
HDL: 57 mg/dL (ref >39.00)
Triglycerides: 92 mg/dL (ref 0.0–149.0)
VLDL: 18.4 mg/dL (ref 0.0–40.0)

## 2010-10-09 LAB — URINALYSIS, ROUTINE W REFLEX MICROSCOPIC
Bilirubin Urine: NEGATIVE
Hgb urine dipstick: NEGATIVE
Ketones, ur: NEGATIVE
Nitrite: NEGATIVE
Total Protein, Urine: NEGATIVE
Urine Glucose: NEGATIVE
pH: 6 (ref 5.0–8.0)

## 2010-10-09 LAB — COMPREHENSIVE METABOLIC PANEL
ALT: 25 U/L (ref 0–53)
BUN: 17 mg/dL (ref 6–23)
CO2: 26 mEq/L (ref 19–32)
Creatinine, Ser: 1 mg/dL (ref 0.4–1.5)
GFR: 79.1 mL/min (ref >60.00)
Total Bilirubin: 0.7 mg/dL (ref 0.3–1.2)

## 2010-10-09 LAB — CBC WITH DIFFERENTIAL/PLATELET
Basophils Absolute: 0 10*3/uL (ref 0.0–0.1)
Basophils Relative: 0.7 % (ref 0.0–3.0)
Eosinophils Absolute: 0.2 10*3/uL (ref 0.0–0.7)
HCT: 40.4 % (ref 39.0–52.0)
Hemoglobin: 14 g/dL (ref 13.0–17.0)
Lymphs Abs: 1 10*3/uL (ref 0.7–4.0)
MCHC: 34.7 g/dL (ref 30.0–36.0)
Monocytes Relative: 9.7 % (ref 3.0–12.0)
Neutro Abs: 3.7 10*3/uL (ref 1.4–7.7)
RDW: 14.1 % (ref 11.5–14.6)

## 2010-10-09 LAB — TSH: TSH: 1.53 u[IU]/mL (ref 0.35–5.50)

## 2010-10-09 LAB — PSA: PSA: 0.56 ng/mL (ref 0.10–4.00)

## 2010-10-12 ENCOUNTER — Encounter: Payer: Self-pay | Admitting: Internal Medicine

## 2010-10-14 ENCOUNTER — Encounter: Payer: Self-pay | Admitting: Internal Medicine

## 2010-10-14 ENCOUNTER — Ambulatory Visit (INDEPENDENT_AMBULATORY_CARE_PROVIDER_SITE_OTHER): Payer: BC Managed Care – PPO | Admitting: Internal Medicine

## 2010-10-14 DIAGNOSIS — E785 Hyperlipidemia, unspecified: Secondary | ICD-10-CM

## 2010-10-14 DIAGNOSIS — I10 Essential (primary) hypertension: Secondary | ICD-10-CM

## 2010-10-14 DIAGNOSIS — Z720 Tobacco use: Secondary | ICD-10-CM

## 2010-10-14 DIAGNOSIS — F17200 Nicotine dependence, unspecified, uncomplicated: Secondary | ICD-10-CM

## 2010-10-14 DIAGNOSIS — Z0000 Encounter for general adult medical examination without abnormal findings: Secondary | ICD-10-CM

## 2010-10-14 MED ORDER — VARENICLINE TARTRATE 1 MG PO TABS: 1.0000 mg | ORAL_TABLET | Freq: Two times a day (BID) | ORAL | Status: DC

## 2010-10-14 MED ORDER — HYDROCHLOROTHIAZIDE 50 MG PO TABS: 50.0000 mg | ORAL_TABLET | Freq: Every day | ORAL | Status: DC

## 2010-10-14 MED ORDER — ALPRAZOLAM 1 MG PO TABS: 1.0000 mg | ORAL_TABLET | Freq: Every evening | ORAL | Status: DC | PRN

## 2010-10-14 MED ORDER — ROSUVASTATIN CALCIUM 40 MG PO TABS: 40.0000 mg | ORAL_TABLET | Freq: Every day | ORAL | Status: DC

## 2010-10-14 MED ORDER — ENALAPRIL MALEATE 20 MG PO TABS: 20.0000 mg | ORAL_TABLET | Freq: Every day | ORAL | Status: DC

## 2010-10-14 NOTE — Progress Notes (Signed)
Subjective:    Patient ID: Eric Delgado, male    DOB: Jun 15, 1955, 55 y.o.   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 In the interval since last visit had right THR Eric Delgado) with no complications and a rapid recovery. He is fully ambulatory and at work.  He reports that he has constant fatigue. He is using supplements. He does take occasional xanax which can help. He has restarted smoking after being off a year. He is interested in restarting chantix. He did have a lot of excessive dreaming previously when taking chantix.   He is having a colonoscopy at [facility] in [facility] September 15th.          Review of Systems Review of Systems  Constitutional:  Negative for fever, chills, activity change and unexpected weight change.  HEENT:  Negative for hearing loss, ear pain, congestion, neck stiffness and postnasal drip. Negative for sore throat or swallowing problems. Negative for dental complaints.   Eyes: Negative for vision loss or change in visual acuity.  Respiratory: Negative for chest tightness and wheezing.   Cardiovascular: Negative for chest pain and palpitation. No decreased exercise tolerance Gastrointestinal: No change in bowel habit. No bloating or gas. No reflux or indigestion Genitourinary: Negative for urgency, frequency, flank pain and difficulty urinating. Decreased force of stream.  Musculoskeletal: Negative for myalgias, back pain, arthralgias and gait problem.  Neurological: Negative for dizziness, tremors, weakness and headaches. Paresthesia both hands with flexion Hematological: Negative for adenopathy.  Psychiatric/Behavioral: Negative for behavioral problems and dysphoric mood.       Objective:   Physical Exam Vital signs reviewed Gen'l: Well nourished well developed white male in no acute distress  HEENT:  Head: Normocephalic and atraumatic.  Right Ear: External ear normal. EAC/TM nl Left Ear: External ear normal.   EAC/TM nl Nose: Nose normal.  Mouth/Throat: Oropharynx is clear and moist. Dentition - native, in good repair. No buccal or palatal lesions. Posterior pharynx clear. Eyes: Conjunctivae and sclera clear. EOM intact. Pupils are equal, round, and reactive to light. Right eye exhibits no discharge. Left eye exhibits no discharge. Neck: Normal range of motion. Neck supple. No JVD present. No tracheal deviation present. No thyromegaly present.  Cardiovascular: Normal rate, regular rhythm, no gallop, no friction rub, no murmur heard.      Quiet precordium. 2+ radial and DP pulses . No carotid bruits Pulmonary/Chest: Effort normal. No respiratory distress or increased WOB, no wheezes, no rales. No chest wall deformity or CVAT. Abdominal: Soft. Bowel sounds are normal in all quadrants. He exhibits no distension, no tenderness, no rebound or guarding, No heptosplenomegaly  Genitourinary:  deferred Musculoskeletal: Normal range of motion. He exhibits no edema and no tenderness.       Small and large joints without redness, synovial thickening or deformity. Full range of motion preserved about all small, median and large joints.  Lymphadenopathy:    He has no cervical or supraclavicular adenopathy.  Neurological: He is alert and oriented to person, place, and time. CN II-XII intact. DTRs 2+ and symmetrical biceps, radial and patellar tendons. Cerebellar function normal with no tremor, rigidity, normal gait and station.  Skin: Skin is warm and dry. No rash noted. No erythema. Body art noted both forearms, did not exam total body. Psychiatric: He has a normal mood and affect. His behavior is normal. Thought content normal.   Lab Results  Component Value Date   WBC 5.6 10/09/2010   HGB 14.0  10/09/2010   HCT 40.4 10/09/2010   PLT 237.0 10/09/2010   CHOL 191 10/09/2010   TRIG 92.0 10/09/2010   HDL 57.00 10/09/2010   LDLDIRECT 148.8 10/18/2006   ALT 25 10/09/2010   AST 22 10/09/2010   NA 137 10/09/2010   K 4.6  10/09/2010   CL 104 10/09/2010   CREATININE 1.0 10/09/2010   BUN 17 10/09/2010   CO2 26 10/09/2010   TSH 1.53 10/09/2010   PSA 0.56 10/09/2010   INR 0.95 05/12/2010   Lab Results  Component Value Date   LDLCALC 116* 10/09/2010           Assessment & Plan:

## 2010-10-16 ENCOUNTER — Encounter: Payer: Self-pay | Admitting: Internal Medicine

## 2010-10-16 DIAGNOSIS — Z0001 Encounter for general adult medical examination with abnormal findings: Secondary | ICD-10-CM | POA: Insufficient documentation

## 2010-10-16 DIAGNOSIS — Z87891 Personal history of nicotine dependence: Secondary | ICD-10-CM | POA: Insufficient documentation

## 2010-10-16 DIAGNOSIS — Z0000 Encounter for general adult medical examination without abnormal findings: Secondary | ICD-10-CM | POA: Insufficient documentation

## 2010-10-16 NOTE — Assessment & Plan Note (Signed)
LDL at 116 is at goal of 130 or less. He is tolerating crestor well even at high dose. No adverse effects or changein LFTs.  Plan - continue present regimen

## 2010-10-16 NOTE — Assessment & Plan Note (Signed)
BP Readings from Last 3 Encounters:  10/14/10 130/82  03/18/10 118/72  05/15/09 124/76   Adequate control of BP on present medications. Lab results reveal normal lytes and renal function  Plan- continue present medications

## 2010-10-16 NOTE — Assessment & Plan Note (Signed)
Interval medical history notable for hip replacement with which he is doing very well - full activity with minimal restrictions, e.g. Carrying heavy loads. He does have some left hip problems but is not yet at a  Point where he will need surgery. Physical exam is normal. Lab results are in normal range. Colorectal cancer screening - he is having colonoscopy in September. PSA was normal. Immunizations - Tdap Dec '09. 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 No restriction on driving.  In summary- a nice man who is medically stable. He will return as needed or in 1 year.

## 2010-10-16 NOTE — Assessment & Plan Note (Signed)
Patient has resumed smoking but wants to quit. He had done well with chantix although he does admit to having had vivid dreaming and that after several months personality issues leading to cessation. However, he found chantix to be very helpful.  Plan- encouraged him in his smoking cessation.          Rx -Chantix 1 mg bid as directed - plan for 4-6 months of treatment.

## 2010-10-17 HISTORY — PX: COLONOSCOPY: SHX174

## 2010-10-20 ENCOUNTER — Telehealth: Payer: Self-pay

## 2010-10-20 MED ORDER — VARENICLINE TARTRATE 1 MG PO TABS: 1.0000 mg | ORAL_TABLET | Freq: Two times a day (BID) | ORAL | Status: AC

## 2010-10-20 NOTE — Telephone Encounter (Signed)
Patient called [facility] requesting that his rx for chantix is sent to [facility]. Thanks

## 2010-11-20 ENCOUNTER — Encounter: Payer: Self-pay | Admitting: Internal Medicine

## 2011-05-11 ENCOUNTER — Other Ambulatory Visit: Payer: Self-pay | Admitting: *Deleted

## 2011-05-11 MED ORDER — ALPRAZOLAM 1 MG PO TABS
1.0000 mg | ORAL_TABLET | Freq: Every evening | ORAL | Status: DC | PRN
Start: 2011-05-11 — End: 2012-02-01

## 2011-05-11 NOTE — Telephone Encounter (Signed)
Alprazolam request [last refill 08.29.12 #30x5]

## 2011-05-11 NOTE — Telephone Encounter (Signed)
Done

## 2011-05-11 NOTE — Telephone Encounter (Signed)
Ok for refill  x3 

## 2011-07-19 ENCOUNTER — Other Ambulatory Visit: Payer: Self-pay

## 2011-07-19 MED ORDER — ENALAPRIL MALEATE 20 MG PO TABS: 20.0000 mg | ORAL_TABLET | Freq: Every day | ORAL | Status: DC

## 2011-07-23 ENCOUNTER — Other Ambulatory Visit: Payer: Self-pay | Admitting: Internal Medicine

## 2011-10-22 ENCOUNTER — Other Ambulatory Visit: Payer: Self-pay | Admitting: Internal Medicine

## 2011-12-29 ENCOUNTER — Encounter: Payer: BC Managed Care – PPO | Admitting: Internal Medicine

## 2012-01-28 ENCOUNTER — Other Ambulatory Visit: Payer: Self-pay | Admitting: Internal Medicine

## 2012-01-31 ENCOUNTER — Other Ambulatory Visit: Payer: Self-pay | Admitting: *Deleted

## 2012-01-31 MED ORDER — ENALAPRIL MALEATE 20 MG PO TABS: 20.0000 mg | ORAL_TABLET | Freq: Every day | ORAL | Status: DC

## 2012-02-01 ENCOUNTER — Other Ambulatory Visit: Payer: Self-pay | Admitting: *Deleted

## 2012-02-01 MED ORDER — ALPRAZOLAM 1 MG PO TABS: 1.0000 mg | ORAL_TABLET | Freq: Every evening | ORAL | Status: DC | PRN

## 2012-05-15 ENCOUNTER — Other Ambulatory Visit: Payer: Self-pay | Admitting: Internal Medicine

## 2012-06-27 ENCOUNTER — Encounter: Payer: Self-pay | Admitting: Internal Medicine

## 2012-07-06 ENCOUNTER — Other Ambulatory Visit: Payer: Self-pay

## 2012-07-06 ENCOUNTER — Other Ambulatory Visit: Payer: Self-pay | Admitting: Internal Medicine

## 2012-07-06 NOTE — Telephone Encounter (Signed)
Pt not seen here since aug 2012  Please make rov

## 2012-07-11 ENCOUNTER — Other Ambulatory Visit: Payer: Self-pay

## 2012-07-11 NOTE — Telephone Encounter (Signed)
[facility] faxed a request. Patient has not been seen in a couple years. Please advise.

## 2012-07-13 ENCOUNTER — Other Ambulatory Visit (INDEPENDENT_AMBULATORY_CARE_PROVIDER_SITE_OTHER): Payer: BC Managed Care – PPO

## 2012-07-13 ENCOUNTER — Encounter: Payer: Self-pay | Admitting: Internal Medicine

## 2012-07-13 ENCOUNTER — Ambulatory Visit (INDEPENDENT_AMBULATORY_CARE_PROVIDER_SITE_OTHER): Payer: BC Managed Care – PPO | Admitting: Internal Medicine

## 2012-07-13 VITALS — BP 140/80 | HR 70 | Temp 97.7°F | Ht 70.5 in | Wt 213.4 lb

## 2012-07-13 DIAGNOSIS — E785 Hyperlipidemia, unspecified: Secondary | ICD-10-CM

## 2012-07-13 DIAGNOSIS — G5600 Carpal tunnel syndrome, unspecified upper limb: Secondary | ICD-10-CM

## 2012-07-13 DIAGNOSIS — I10 Essential (primary) hypertension: Secondary | ICD-10-CM

## 2012-07-13 DIAGNOSIS — Z720 Tobacco use: Secondary | ICD-10-CM

## 2012-07-13 DIAGNOSIS — F17200 Nicotine dependence, unspecified, uncomplicated: Secondary | ICD-10-CM

## 2012-07-13 DIAGNOSIS — F411 Generalized anxiety disorder: Secondary | ICD-10-CM

## 2012-07-13 LAB — LIPID PANEL: Cholesterol: 238 mg/dL — ABNORMAL HIGH (ref 0–200)

## 2012-07-13 LAB — HEPATIC FUNCTION PANEL
AST: 26 U/L (ref 0–37)
Albumin: 4.5 g/dL (ref 3.5–5.2)
Alkaline Phosphatase: 39 U/L (ref 39–117)
Total Protein: 8.1 g/dL (ref 6.0–8.3)

## 2012-07-13 LAB — COMPREHENSIVE METABOLIC PANEL
ALT: 37 U/L (ref 0–53)
AST: 26 U/L (ref 0–37)
Alkaline Phosphatase: 39 U/L (ref 39–117)
Creatinine, Ser: 1 mg/dL (ref 0.4–1.5)
Total Bilirubin: 0.7 mg/dL (ref 0.3–1.2)

## 2012-07-13 MED ORDER — FLUOXETINE HCL 40 MG PO CAPS: 40.0000 mg | ORAL_CAPSULE | Freq: Every day | ORAL | Status: DC

## 2012-07-13 MED ORDER — ALPRAZOLAM 1 MG PO TABS: 1.0000 mg | ORAL_TABLET | Freq: Every evening | ORAL | Status: DC | PRN

## 2012-07-13 MED ORDER — HYDROCHLOROTHIAZIDE 50 MG PO TABS: ORAL_TABLET | ORAL | Status: DC

## 2012-07-13 MED ORDER — ENALAPRIL MALEATE 20 MG PO TABS: ORAL_TABLET | ORAL | Status: DC

## 2012-07-13 NOTE — Progress Notes (Signed)
Subjective:    Patient ID: Eric Delgado, male    DOB: 1955/02/19, 57 y.o.   MRN: [id number]  HPI [name] presents for medical follow up. He has been off tobacco x 2 years but since stopping chantix he has not felt "happy." He describes this a a down feeling. He also has ADD and obsessive impulsive behavior.  He has carpal tunnel syndrome, had positive NCS studies and he is setting up surgical release with Dr. Amanda Pea.   He needs refills for his regular medications but doesn't want to have a physical exam..  Past Medical History  Diagnosis Date  . Anxiety   . Hypertension   . Hyperlipidemia   . History of paresthesia    Past Surgical History  Procedure Laterality Date  . Thr right  Spring '12    Dr. Charlann Boxer - ceramic ball, fiber stem  . Vasectomy     Family History  Problem Relation Age of Onset  . Macular degeneration Mother   . Hypertension Mother   . Hyperlipidemia Mother    History   Social History  . Marital Status: Divorced    Spouse Name: N/A    Number of Children: N/A  . Years of Education: N/A   Occupational History  . Not on file.   Social History Main Topics  . Smoking status: Current Every Day Smoker -- 0.50 packs/day for 30 years    Types: Cigarettes  . Smokeless tobacco: Never Used  . Alcohol Use: No  . Drug Use: No  . Sexually Active: Yes -- Male partner(s)   Other Topics Concern  . Not on file   Social History Narrative   Divorced   Works in Games developer   Regular exercise-yes          Current Outpatient Prescriptions on File Prior to Visit  Medication Sig Dispense Refill  . CRESTOR 40 MG tablet TAKE 1 TABLET BY MOUTH EVERY EVENING  30 tablet  2   No current facility-administered medications on file prior to visit.      Review of Systems Constitutional:  Negative for fever, chills, activity change and unexpected weight change.  HEENT:  Negative for hearing loss, ear pain, congestion, neck stiffness and postnasal  drip. Negative for sore throat or swallowing problems. Negative for dental complaints.   Eyes: Negative for vision loss or change in visual acuity.  Respiratory: Negative for chest tightness and wheezing. Negative for DOE.   Cardiovascular: Negative for chest pain or palpitations. No decreased exercise tolerance Gastrointestinal: No change in bowel habit. No bloating or gas. No reflux or indigestion Genitourinary: Negative for urgency, frequency, flank pain and difficulty urinating.  Musculoskeletal: Negative for myalgias, back pain, arthralgias and gait problem.  Neurological: Negative for dizziness, tremors, weakness and headaches.  Hematological: Negative for adenopathy.  Psychiatric/Behavioral: positive for behavioral problems with depression, compuslive behavior and anxiety and dysphoric mood.       Objective:   Physical Exam Filed Vitals:   07/13/12 1037  BP: 140/80  Pulse: 70  Temp: 97.7 F (36.5 C)   Weight: 213 lb 6.4 oz (96.798 kg)   Gen'l = WNWD white man in no distress HEENT- C&S clear, PERRLA Cor 2+ radial pulse, RRR Pul - normal respirations Neuro - A&O x 3, CN II-XII grossly intact. Normal gait and station. No tremors Psych - appears calm, not delusional.  Recent Results (from the past 2160 hour(s))  LIPID PANEL     Status: Abnormal   Collection Time  07/13/12 11:26 AM      Result Value Range   Cholesterol 238 (*) 0 - 200 mg/dL   Comment: ATP III Classification       Desirable:  < 200 mg/dL               Borderline High:  200 - 239 mg/dL          High:  > = 960 mg/dL   Triglycerides 454.0 (*) 0.0 - 149.0 mg/dL   Comment: Normal:  <981 mg/dLBorderline High:  150 - 199 mg/dL   HDL 19.14  >78.29 mg/dL   VLDL 56.2 (*) 0.0 - 13.0 mg/dL   Total CHOL/HDL Ratio 4     Comment:                Men          Women1/2 Average Risk     3.4          3.3Average Risk          5.0          4.42X Average Risk          9.6          7.13X Average Risk          15.0          11.0                       HEPATIC FUNCTION PANEL     Status: None   Collection Time    07/13/12 11:26 AM      Result Value Range   Total Bilirubin 0.7  0.3 - 1.2 mg/dL   Bilirubin, Direct 0.0  0.0 - 0.3 mg/dL   Alkaline Phosphatase 39  39 - 117 U/L   AST 26  0 - 37 U/L   ALT 37  0 - 53 U/L   Total Protein 8.1  6.0 - 8.3 g/dL   Albumin 4.5  3.5 - 5.2 g/dL  COMPREHENSIVE METABOLIC PANEL     Status: None   Collection Time    07/13/12 11:26 AM      Result Value Range   Sodium 135  135 - 145 mEq/L   Potassium 4.2  3.5 - 5.1 mEq/L   Chloride 100  96 - 112 mEq/L   CO2 25  19 - 32 mEq/L   Glucose, Bld 99  70 - 99 mg/dL   BUN 19  6 - 23 mg/dL   Creatinine, Ser 1.0  0.4 - 1.5 mg/dL   Total Bilirubin 0.7  0.3 - 1.2 mg/dL   Alkaline Phosphatase 39  39 - 117 U/L   AST 26  0 - 37 U/L   ALT 37  0 - 53 U/L   Total Protein 8.1  6.0 - 8.3 g/dL   Albumin 4.5  3.5 - 5.2 g/dL   Calcium 86.5  8.4 - 78.4 mg/dL   GFR 69.62  >95.28 mL/min  LDL CHOLESTEROL, DIRECT     Status: None   Collection Time    07/13/12 11:26 AM      Result Value Range   Direct LDL 144.8     Comment: Optimal:  <100 mg/dLNear or Above Optimal:  100-129 mg/dLBorderline High:  130-159 mg/dLHigh:  160-189 mg/dLVery High:  >190 mg/dL        Assessment & Plan:

## 2012-07-13 NOTE — Patient Instructions (Addendum)
1. Cholesterol - plan is for lab today and then try substituting Pravachol (pravastatin) at an appropriate dose for control. Will need to have a follow up lipid panel after 4 weeks on the new medication.  2. Blood pressure -  BP Readings from Last 3 Encounters:  07/13/12 140/80  10/14/10 130/82  03/18/10 118/72   A little high today. Plan is to continue present medication and resume taking the HCTZ.  3. Pschy - not happy, anxious, ADD and compulsive behavior - worth trying an SSRI: Prozac (fluoxitine) 40 mg once a day. May continue the Xanax as needed. It will take a full 4 weeks to see maximal effect of this medication.  4. Carpal tunnel repair - medically cleared for surgery.  5. Health maintenance is up to date.   Please sign up for my chart: you can report back BP, I can send the lab results to this account.

## 2012-07-16 ENCOUNTER — Encounter: Payer: Self-pay | Admitting: Internal Medicine

## 2012-07-16 NOTE — Assessment & Plan Note (Signed)
BP Readings from Last 3 Encounters:  07/13/12 140/80  10/14/10 130/82  03/18/10 118/72   A little high today. Plan is to continue present medication and resume taking the HCTZ.. BP checks at home with report back to ensure adequate control

## 2012-07-16 NOTE — Assessment & Plan Note (Signed)
Pschy - not happy, anxious, ADD and compulsive behavior - worth trying an SSRI: Prozac (fluoxitine) 40 mg once a day. May continue the Xanax as needed. It will take a full 4 weeks to see maximal effect of this medication. Patient to follow up by MyChart messaging or office visit i 4 weeks.

## 2012-07-16 NOTE — Assessment & Plan Note (Signed)
H/o pararthesia in the hands - fully worked up as carpal tunnel syndrome.  Plan Move ahead with carpal tunnel release by Dr. Butler Denmark

## 2012-07-16 NOTE — Assessment & Plan Note (Signed)
Successful cessation - managed with chantix which he has now stopped. But, he is not smoking.

## 2012-07-16 NOTE — Assessment & Plan Note (Signed)
Cholesterol - plan is for lab today and then try substituting Pravachol (pravastatin) at an appropriate dose for control. Will need to have a follow up lipid panel after 4 weeks on the new medication.  Addendum - LDL 144 on no medications which is below the treatment threshold of 160 for a patient with low to moderate cardiac risk factors.  Plan No medical therapy at this time  Life-style management: low fat diet and regular exercise.

## 2012-07-20 ENCOUNTER — Other Ambulatory Visit: Payer: Self-pay | Admitting: Internal Medicine

## 2012-07-20 MED ORDER — EZETIMIBE 10 MG PO TABS: 10.0000 mg | ORAL_TABLET | Freq: Every day | ORAL | Status: DC

## 2012-07-23 ENCOUNTER — Encounter: Payer: Self-pay | Admitting: Internal Medicine

## 2012-07-26 ENCOUNTER — Encounter: Payer: Self-pay | Admitting: Internal Medicine

## 2012-09-04 ENCOUNTER — Other Ambulatory Visit: Payer: Self-pay | Admitting: Internal Medicine

## 2012-12-21 ENCOUNTER — Other Ambulatory Visit: Payer: Self-pay

## 2013-01-17 ENCOUNTER — Other Ambulatory Visit: Payer: Self-pay

## 2013-01-17 MED ORDER — ALPRAZOLAM 1 MG PO TABS: 1.0000 mg | ORAL_TABLET | Freq: Every evening | ORAL | Status: DC | PRN

## 2013-01-17 NOTE — Telephone Encounter (Signed)
Previous Alprazolam script was called to the wrong [facility] pharmacy.

## 2013-03-16 ENCOUNTER — Other Ambulatory Visit: Payer: Self-pay | Admitting: Internal Medicine

## 2013-05-25 ENCOUNTER — Encounter: Payer: Self-pay | Admitting: Family Medicine

## 2013-05-25 ENCOUNTER — Telehealth: Payer: Self-pay | Admitting: Family Medicine

## 2013-05-25 ENCOUNTER — Ambulatory Visit (INDEPENDENT_AMBULATORY_CARE_PROVIDER_SITE_OTHER): Payer: BC Managed Care – PPO | Admitting: Family Medicine

## 2013-05-25 VITALS — BP 150/106 | HR 79 | Temp 98.0°F | Ht 69.0 in | Wt 206.8 lb

## 2013-05-25 DIAGNOSIS — Z0000 Encounter for general adult medical examination without abnormal findings: Secondary | ICD-10-CM

## 2013-05-25 DIAGNOSIS — E785 Hyperlipidemia, unspecified: Secondary | ICD-10-CM

## 2013-05-25 DIAGNOSIS — Z87891 Personal history of nicotine dependence: Secondary | ICD-10-CM

## 2013-05-25 DIAGNOSIS — I10 Essential (primary) hypertension: Secondary | ICD-10-CM

## 2013-05-25 MED ORDER — LISINOPRIL-HYDROCHLOROTHIAZIDE 20-25 MG PO TABS: 1.0000 | ORAL_TABLET | Freq: Every day | ORAL | Status: DC

## 2013-05-25 NOTE — Assessment & Plan Note (Signed)
Preventative protocols reviewed and updated unless pt declined. Discussed healthy diet and lifestyle.  

## 2013-05-25 NOTE — Assessment & Plan Note (Signed)
Encouraged to remain abstinent. 

## 2013-05-25 NOTE — Telephone Encounter (Signed)
Relevant patient education assigned to patient using Emmi. ° °

## 2013-05-25 NOTE — Progress Notes (Signed)
BP 150/106  Pulse 79  Temp(Src) 98 F (36.7 C) (Oral)  Ht 5\' 9"  (1.753 m)  Wt 206 lb 12 oz (93.781 kg)  BMI 30.52 kg/m2  SpO2 95%   CC: CPE   Subjective:    Patient ID: Eric Delgado, male    DOB: [birth year], 58 y.o.   MRN: [id number]  HPI: Eric Delgado is a 58 y.o. male presenting on 05/25/2013 for Establish Care and Annual Exam   Transfer of care from Dr. Linda Hedges.  HTN - at home running 140/90.  Doesn't regularly take meds.    HLD - prior on crestor but too expensive, this was stopped and recommended diet changes.  Has not followed diet.  lipitor caused myalgias.  Lovastatin didn't control sugars.  May be interested in retrial of lipitor.  Wt Readings from Last 3 Encounters:  05/25/13 206 lb 12 oz (93.781 kg)  07/13/12 213 lb 6.4 oz (96.798 kg)  10/14/10 197 lb (89.359 kg)   Body mass index is 30.52 kg/(m^2).  Preventative: Colonoscopy [facility] 2012 Prostate cancer screening - discussed - requests PSA and if elevated will request further testing. 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  Allergies and medications reviewed and updated. Current Outpatient Prescriptions on File Prior to Visit  Medication Sig  . ALPRAZolam (XANAX) 1 MG tablet Take 1 tablet (1 mg total) by mouth at bedtime as needed for sleep.   No current facility-administered medications on file prior to visit.    Review of Systems  Constitutional: Negative for fever, chills, activity change, appetite change, fatigue and unexpected weight change.  HENT: Negative for hearing loss.   Eyes: Negative for visual disturbance.  Respiratory: Negative for cough, chest tightness, shortness of  breath and wheezing.   Cardiovascular: Negative for chest pain, palpitations and leg swelling.  Gastrointestinal: Negative for nausea, vomiting, abdominal pain, diarrhea, constipation, blood in stool and abdominal distention.  Genitourinary: Negative for hematuria and difficulty urinating.  Musculoskeletal: Negative for arthralgias, myalgias and neck pain.  Skin: Negative for rash.  Neurological: Negative for dizziness, seizures, syncope and headaches.  Hematological: Negative for adenopathy. Does not bruise/bleed easily.  Psychiatric/Behavioral: Negative for dysphoric mood. The patient is not nervous/anxious.        Mourning both parents loss in last month as well as 2 dogs died   Per HPI unless specifically indicated above    Objective:    BP 150/106  Pulse 79  Temp(Src) 98 F (36.7 C) (Oral)  Ht 5\' 9"  (1.753 m)  Wt 206 lb 12 oz (93.781 kg)  BMI 30.52 kg/m2  SpO2 95%  Physical Exam  Nursing note and vitals reviewed. Constitutional: He is oriented to person, place, and time. He appears well-developed and well-nourished. No distress.  HENT:  Head: Normocephalic and atraumatic.  Right Ear: Hearing, tympanic membrane, external ear and ear canal normal.  Left Ear: Hearing, tympanic membrane, external ear and ear canal normal.  Nose: Nose normal.  Mouth/Throat: Uvula is midline, oropharynx is clear and moist and mucous membranes are normal. No oropharyngeal exudate, posterior oropharyngeal edema or posterior oropharyngeal erythema.  Eyes: Conjunctivae and EOM are normal. Pupils are equal, round, and reactive to light. No scleral icterus.  Neck: Normal range of motion. Neck supple. No thyromegaly present.  Cardiovascular: Normal rate, regular rhythm, normal heart sounds and intact distal pulses.   No murmur heard. Pulses:      Radial pulses are 2+ on the right side, and 2+ on the left side.  Pulmonary/Chest: Effort normal and breath sounds normal. No respiratory distress. He has no  wheezes. He has no rales.  Abdominal: Soft. Bowel sounds are normal. He exhibits no distension and no mass. There is no tenderness. There is no rebound and no guarding.  Genitourinary:  deferred  Musculoskeletal: Normal range of motion. He exhibits no edema.  Lymphadenopathy:    He has no cervical adenopathy.  Neurological: He is alert and oriented to person, place, and time.  CN grossly intact, station and gait intact  Skin: Skin is warm and dry. No rash noted.  Psychiatric: He has a normal mood and affect. His behavior is normal. Judgment and thought content normal.       Assessment & Plan:   Problem List Items Addressed This Visit   HYPERLIPIDEMIA     crestor too expensive.  Pt stopped med in past - will check FLP next Friday when he returns for fasting blood work. Pt would consider retrial of lipitor.    Relevant Medications      lisinopril-hydrochlorothiazide (PRINZIDE,ZESTORETIC) 20-25 MG per tablet   HYPERTENSION     Elevated recently - not compliant with meds 2/2 concern for polyuria and forgetting med. Will change to lisinopril hctz combo pill 20/25mg  daily.  F/u office visit in 6 mo. Advised notify me if persistently elevated prior to 6 mo f/u.    Relevant Medications      lisinopril-hydrochlorothiazide (PRINZIDE,ZESTORETIC) 20-25 MG per tablet   Routine health maintenance - Primary     Preventative protocols reviewed and updated unless pt declined. Discussed healthy diet and lifestyle.    Ex-smoker     Encouraged to remain abstinent        Follow up plan: Return in about 6 months (around 11/24/2013), or as needed, for follow up visit.

## 2013-05-25 NOTE — Assessment & Plan Note (Signed)
crestor too expensive.  Pt stopped med in past - will check FLP next Friday when he returns for fasting blood work. Pt would consider retrial of lipitor.

## 2013-05-25 NOTE — Patient Instructions (Signed)
Let's stop enalapril and hydrochlorothiazide and start lisinopril/hydrochlorothiazide combo pill once daily - remember to take every morning. Return next Friday fasting for blood work and we will call you with results. Sign release for records from last colonoscopy 2012 [facility]). Return as needed or in 6 months for follow up visit, let me know sooner if blood pressure staying elevated. Good to meet you today, call us with questions.

## 2013-05-25 NOTE — Progress Notes (Signed)
Pre visit review using our clinic review tool, if applicable. No additional management support is needed unless otherwise documented below in the visit note. 

## 2013-05-25 NOTE — Assessment & Plan Note (Signed)
Elevated recently - not compliant with meds 2/2 concern for polyuria and forgetting med. Will change to lisinopril hctz combo pill 20/25mg  daily.  F/u office visit in 6 mo. Advised notify me if persistently elevated prior to 6 mo f/u.

## 2013-06-01 ENCOUNTER — Encounter: Payer: Self-pay | Admitting: Family Medicine

## 2013-06-01 ENCOUNTER — Other Ambulatory Visit (INDEPENDENT_AMBULATORY_CARE_PROVIDER_SITE_OTHER): Payer: BC Managed Care – PPO

## 2013-06-01 ENCOUNTER — Encounter: Payer: Self-pay | Admitting: Radiology

## 2013-06-01 DIAGNOSIS — E785 Hyperlipidemia, unspecified: Secondary | ICD-10-CM

## 2013-06-01 DIAGNOSIS — I10 Essential (primary) hypertension: Secondary | ICD-10-CM

## 2013-06-01 LAB — COMPREHENSIVE METABOLIC PANEL
ALBUMIN: 4.2 g/dL (ref 3.5–5.2)
ALT: 23 U/L (ref 0–53)
AST: 23 U/L (ref 0–37)
Alkaline Phosphatase: 30 U/L — ABNORMAL LOW (ref 39–117)
BUN: 19 mg/dL (ref 6–23)
CALCIUM: 9.4 mg/dL (ref 8.4–10.5)
CHLORIDE: 104 meq/L (ref 96–112)
CO2: 25 meq/L (ref 19–32)
Creatinine, Ser: 1 mg/dL (ref 0.4–1.5)
GFR: 78.34 mL/min (ref >60.00)
Glucose, Bld: 100 mg/dL — ABNORMAL HIGH (ref 70–99)
POTASSIUM: 4.3 meq/L (ref 3.5–5.1)
SODIUM: 136 meq/L (ref 135–145)
TOTAL PROTEIN: 7.2 g/dL (ref 6.0–8.3)
Total Bilirubin: 0.9 mg/dL (ref 0.3–1.2)

## 2013-06-01 LAB — LIPID PANEL
CHOLESTEROL: 287 mg/dL — AB (ref 0–200)
HDL: 51.7 mg/dL (ref >39.00)
LDL Cholesterol: 204 mg/dL — ABNORMAL HIGH (ref 0–99)
TRIGLYCERIDES: 158 mg/dL — AB (ref 0.0–149.0)
Total CHOL/HDL Ratio: 6
VLDL: 31.6 mg/dL (ref 0.0–40.0)

## 2013-06-01 LAB — TSH: TSH: 2.43 u[IU]/mL (ref 0.35–5.50)

## 2013-06-08 ENCOUNTER — Encounter: Payer: Self-pay | Admitting: Family Medicine

## 2013-06-10 ENCOUNTER — Other Ambulatory Visit: Payer: Self-pay | Admitting: Family Medicine

## 2013-06-10 MED ORDER — ATORVASTATIN CALCIUM 40 MG PO TABS: 40.0000 mg | ORAL_TABLET | Freq: Every day | ORAL | Status: DC

## 2013-06-10 MED ORDER — CO Q10 100 MG PO CAPS: 1.0000 | ORAL_CAPSULE | Freq: Every day | ORAL | Status: DC

## 2013-06-16 ENCOUNTER — Encounter: Payer: Self-pay | Admitting: Family Medicine

## 2013-07-10 ENCOUNTER — Encounter: Payer: Self-pay | Admitting: Family Medicine

## 2013-07-11 ENCOUNTER — Encounter: Payer: Self-pay | Admitting: Family Medicine

## 2013-07-11 MED ORDER — ROSUVASTATIN CALCIUM 40 MG PO TABS: 40.0000 mg | ORAL_TABLET | Freq: Every day | ORAL | Status: DC

## 2013-07-25 ENCOUNTER — Other Ambulatory Visit: Payer: Self-pay | Admitting: *Deleted

## 2013-07-25 MED ORDER — ALPRAZOLAM 1 MG PO TABS: 1.0000 mg | ORAL_TABLET | Freq: Every evening | ORAL | Status: DC | PRN

## 2013-07-25 NOTE — Telephone Encounter (Signed)
Ok to refill 

## 2013-07-25 NOTE — Telephone Encounter (Signed)
Rx called in as directed.   

## 2013-07-25 NOTE — Telephone Encounter (Signed)
plz phone in. 

## 2013-10-26 ENCOUNTER — Telehealth: Payer: Self-pay

## 2013-10-26 NOTE — Telephone Encounter (Signed)
Pt received EOB from ins that 05/2013 received $1000.00 chg for toxicology testing; pt wants to know what test were preformed and why cost was so high. Advised pt he should not get a bill but pt wants to speak with someone; advised pt to contact [place] at [phone].

## 2013-11-23 ENCOUNTER — Ambulatory Visit: Payer: BC Managed Care – PPO | Admitting: Family Medicine

## 2013-12-07 NOTE — Progress Notes (Signed)
Please put orders in Epic surgery 12-21-13 pre op 12-14-13 Thanks

## 2013-12-10 ENCOUNTER — Other Ambulatory Visit (HOSPITAL_COMMUNITY): Payer: Self-pay | Admitting: Orthopaedic Surgery

## 2013-12-13 ENCOUNTER — Other Ambulatory Visit (HOSPITAL_COMMUNITY): Payer: Self-pay | Admitting: *Deleted

## 2013-12-13 NOTE — Patient Instructions (Addendum)
Eric Delgado  12/13/2013                           YOUR PROCEDURE IS SCHEDULED ON:  12/21/13                ENTER FROM [address] -  ENTER THRU EMERGENCY ENTRANCE                                           FOLLOW  SIGNS TO SHORT STAY CENTER                 ARRIVE AT SHORT STAY AT: 5:30 AM               CALL THIS NUMBER IF ANY PROBLEMS THE DAY OF SURGERY :               [phone]                                REMEMBER:   Do not eat food or drink liquids AFTER MIDNIGHT                  Take these medicines the morning of surgery with               A SIPS OF WATER :   MAY TAKE HYDROCODONE IF NEEDED FOR PAIN      Do not wear jewelry, make-up   Do not wear lotions, powders, or perfumes.   Do not shave legs or underarms 12 hrs. before surgery (men may shave face)  Do not bring valuables to the hospital.  Contacts, dentures or bridgework may not be worn into surgery.  Leave suitcase in the car. After surgery it may be brought to your room.  For patients admitted to the hospital more than one night, checkout time is            11:00 AM                                                        ________________________________________________________________________                                                                                                  [name]  Before surgery, you can play an important role.  Because skin is not sterile, your skin needs to be as free of germs as possible.  You can reduce the number of germs on your skin by washing with CHG (chlorahexidine gluconate) soap before surgery.  CHG is an antiseptic cleaner which kills germs and bonds with the skin to continue killing germs even after washing. Please DO NOT use if you have  an allergy to CHG or antibacterial soaps.  If your skin becomes reddened/irritated stop using the CHG and inform your nurse when you arrive at Short Stay. Do not shave (including legs  and underarms) for at least 48 hours prior to the first CHG shower.  You may shave your face. Please follow these instructions carefully:   1.  Shower with CHG Soap the night before surgery and the  morning of Surgery.   2.  If you choose to wash your hair, wash your hair first as usual with your  normal  Shampoo.   3.  After you shampoo, rinse your hair and body thoroughly to remove the  shampoo.                                         4.  Use CHG as you would any other liquid soap.  You can apply chg directly  to the skin and wash . Gently wash with scrungie or clean wascloth    5.  Apply the CHG Soap to your body ONLY FROM THE NECK DOWN.   Do not use on open                           Wound or open sores. Avoid contact with eyes, ears mouth and genitals (private parts).                        Genitals (private parts) with your normal soap.              6.  Wash thoroughly, paying special attention to the area where your surgery  will be performed.   7.  Thoroughly rinse your body with warm water from the neck down.   8.  DO NOT shower/wash with your normal soap after using and rinsing off  the CHG Soap .                9.  Pat yourself dry with a clean towel.             10.  Wear clean pajamas.             11.  Place clean sheets on your bed the night of your first shower and do not  sleep with pets.  Day of Surgery : Do not apply any lotions/deodorants the morning of surgery.  Please wear clean clothes to the hospital/surgery center.  FAILURE TO FOLLOW THESE INSTRUCTIONS MAY RESULT IN THE CANCELLATION OF YOUR SURGERY    PATIENT SIGNATURE_________________________________  ______________________________________________________________________

## 2013-12-14 ENCOUNTER — Encounter (HOSPITAL_COMMUNITY): Payer: Self-pay | Admitting: Pharmacy Technician

## 2013-12-14 ENCOUNTER — Ambulatory Visit (HOSPITAL_COMMUNITY)
Admission: RE | Admit: 2013-12-14 | Discharge: 2013-12-14 | Disposition: A | Discharge status: Home / Self Care | Payer: BC Managed Care – PPO | Source: Ambulatory Visit | Attending: Anesthesiology | Admitting: Anesthesiology

## 2013-12-14 ENCOUNTER — Encounter (HOSPITAL_COMMUNITY)
Admission: RE | Admit: 2013-12-14 | Discharge: 2013-12-14 | Disposition: A | Discharge status: Home / Self Care | Payer: BC Managed Care – PPO | Source: Ambulatory Visit | Attending: Orthopaedic Surgery | Admitting: Orthopaedic Surgery

## 2013-12-14 ENCOUNTER — Encounter (HOSPITAL_COMMUNITY): Payer: Self-pay

## 2013-12-14 DIAGNOSIS — Z01810 Encounter for preprocedural cardiovascular examination: Secondary | ICD-10-CM | POA: Insufficient documentation

## 2013-12-14 DIAGNOSIS — Z01812 Encounter for preprocedural laboratory examination: Secondary | ICD-10-CM | POA: Insufficient documentation

## 2013-12-14 DIAGNOSIS — Z01811 Encounter for preprocedural respiratory examination: Secondary | ICD-10-CM | POA: Diagnosis not present

## 2013-12-14 DIAGNOSIS — I10 Essential (primary) hypertension: Secondary | ICD-10-CM | POA: Diagnosis not present

## 2013-12-14 HISTORY — DX: Sleep disorder, unspecified: G47.9

## 2013-12-14 HISTORY — DX: Unspecified osteoarthritis, unspecified site: M19.90

## 2013-12-14 LAB — BASIC METABOLIC PANEL
Anion gap: 13 (ref 5–15)
BUN: 23 mg/dL (ref 6–23)
CALCIUM: 9.9 mg/dL (ref 8.4–10.5)
CO2: 24 mEq/L (ref 19–32)
Chloride: 99 mEq/L (ref 96–112)
Creatinine, Ser: 0.96 mg/dL (ref 0.50–1.35)
GFR calc Af Amer: >90 mL/min (ref >90)
GFR calc non Af Amer: >90 mL/min (ref >90)
GLUCOSE: 108 mg/dL — AB (ref 70–99)
Potassium: 4.9 mEq/L (ref 3.7–5.3)
Sodium: 136 mEq/L — ABNORMAL LOW (ref 137–147)

## 2013-12-14 LAB — CBC
HCT: 39.8 % (ref 39.0–52.0)
Hemoglobin: 13.9 g/dL (ref 13.0–17.0)
MCH: 30 pg (ref 26.0–34.0)
MCHC: 34.9 g/dL (ref 30.0–36.0)
MCV: 86 fL (ref 78.0–100.0)
Platelets: 244 10*3/uL (ref 150–400)
RBC: 4.63 MIL/uL (ref 4.22–5.81)
RDW: 12.8 % (ref 11.5–15.5)
WBC: 6.1 10*3/uL (ref 4.0–10.5)

## 2013-12-14 LAB — PROTIME-INR
INR: 0.94 (ref 0.00–1.49)
Prothrombin Time: 12.7 seconds (ref 11.6–15.2)

## 2013-12-14 LAB — URINALYSIS, ROUTINE W REFLEX MICROSCOPIC
Bilirubin Urine: NEGATIVE
GLUCOSE, UA: NEGATIVE mg/dL
Hgb urine dipstick: NEGATIVE
Ketones, ur: NEGATIVE mg/dL
Leukocytes, UA: NEGATIVE
Nitrite: NEGATIVE
PH: 5 (ref 5.0–8.0)
Protein, ur: NEGATIVE mg/dL
SPECIFIC GRAVITY, URINE: 1.014 (ref 1.005–1.030)
Urobilinogen, UA: 0.2 mg/dL (ref 0.0–1.0)

## 2013-12-14 LAB — APTT: APTT: 31 s (ref 24–37)

## 2013-12-14 LAB — SURGICAL PCR SCREEN
MRSA, PCR: NEGATIVE
Staphylococcus aureus: POSITIVE — AB

## 2013-12-14 NOTE — Progress Notes (Signed)
12/14/13 6759  OBSTRUCTIVE SLEEP APNEA  Have you ever been diagnosed with sleep apnea through a sleep study? No  Do you snore loudly (loud enough to be heard through closed doors)?  1  Do you often feel tired, fatigued, or sleepy during the daytime? 0  Has anyone observed you stop breathing during your sleep? 0  Do you have, or are you being treated for high blood pressure? 1  BMI more than 35 kg/m2? 0  Age over 57 years old? 1  Neck circumference greater than 40 cm/16 inches? 1  Gender: 1  Obstructive Sleep Apnea Score 5  Score 4 or greater  Results sent to PCP

## 2013-12-20 NOTE — Anesthesia Preprocedure Evaluation (Addendum)
Anesthesia Evaluation  Patient identified by MRN, date of birth, ID band Patient awake    Reviewed: Allergy & Precautions, H&P , NPO status , Patient's Chart, lab work & pertinent test results  Airway Mallampati: II  TM Distance: >3 FB Neck ROM: full    Dental no notable dental hx. (+) Teeth Intact, Dental Advisory Given   Pulmonary neg pulmonary ROS, former smoker,  breath sounds clear to auscultation  Pulmonary exam normal       Cardiovascular Exercise Tolerance: Good hypertension, Pt. on medications negative cardio ROS  Rhythm:regular Rate:Normal     Neuro/Psych negative neurological ROS  negative psych ROS   GI/Hepatic negative GI ROS, Neg liver ROS,   Endo/Other  negative endocrine ROS  Renal/GU negative Renal ROS  negative genitourinary   Musculoskeletal   Abdominal   Peds  Hematology negative hematology ROS (+)   Anesthesia Other Findings Chronic xanax for sleep disorder  Reproductive/Obstetrics negative OB ROS                            Anesthesia Physical Anesthesia Plan  ASA: II  Anesthesia Plan:    Post-op Pain Management:    Induction:   Airway Management Planned:   Additional Equipment:   Intra-op Plan:   Post-operative Plan:   Informed Consent: I have reviewed the patients History and Physical, chart, labs and discussed the procedure including the risks, benefits and alternatives for the proposed anesthesia with the patient or authorized representative who has indicated his/her understanding and acceptance.   Dental Advisory Given  Plan Discussed with: CRNA and Surgeon  Anesthesia Plan Comments:         Anesthesia Quick Evaluation

## 2013-12-21 ENCOUNTER — Encounter (HOSPITAL_COMMUNITY): Payer: Self-pay | Admitting: *Deleted

## 2013-12-21 ENCOUNTER — Encounter (HOSPITAL_COMMUNITY)
Admission: RE | Disposition: A | Discharge status: Home Health | Payer: Self-pay | Source: Ambulatory Visit | Attending: Orthopaedic Surgery

## 2013-12-21 ENCOUNTER — Inpatient Hospital Stay (HOSPITAL_COMMUNITY): Payer: BC Managed Care – PPO | Admitting: Anesthesiology

## 2013-12-21 ENCOUNTER — Inpatient Hospital Stay (HOSPITAL_COMMUNITY): Payer: BC Managed Care – PPO

## 2013-12-21 ENCOUNTER — Inpatient Hospital Stay (HOSPITAL_COMMUNITY)
Admission: RE | Admit: 2013-12-21 | Discharge: 2013-12-22 | DRG: 470 | Disposition: A | Discharge status: Home Health | Payer: BC Managed Care – PPO | Source: Ambulatory Visit | Attending: Orthopaedic Surgery | Admitting: Orthopaedic Surgery

## 2013-12-21 DIAGNOSIS — M25552 Pain in left hip: Secondary | ICD-10-CM | POA: Diagnosis present

## 2013-12-21 DIAGNOSIS — M1612 Unilateral primary osteoarthritis, left hip: Principal | ICD-10-CM | POA: Diagnosis present

## 2013-12-21 DIAGNOSIS — Z96643 Presence of artificial hip joint, bilateral: Secondary | ICD-10-CM

## 2013-12-21 DIAGNOSIS — I10 Essential (primary) hypertension: Secondary | ICD-10-CM | POA: Diagnosis present

## 2013-12-21 DIAGNOSIS — Z96641 Presence of right artificial hip joint: Secondary | ICD-10-CM | POA: Diagnosis present

## 2013-12-21 DIAGNOSIS — Z79899 Other long term (current) drug therapy: Secondary | ICD-10-CM | POA: Diagnosis not present

## 2013-12-21 DIAGNOSIS — M159 Polyosteoarthritis, unspecified: Secondary | ICD-10-CM

## 2013-12-21 DIAGNOSIS — E785 Hyperlipidemia, unspecified: Secondary | ICD-10-CM | POA: Diagnosis present

## 2013-12-21 DIAGNOSIS — Z87891 Personal history of nicotine dependence: Secondary | ICD-10-CM

## 2013-12-21 DIAGNOSIS — Z96642 Presence of left artificial hip joint: Secondary | ICD-10-CM

## 2013-12-21 DIAGNOSIS — M25559 Pain in unspecified hip: Secondary | ICD-10-CM

## 2013-12-21 HISTORY — DX: Polyosteoarthritis, unspecified: M15.9

## 2013-12-21 HISTORY — PX: TOTAL HIP ARTHROPLASTY: SHX124

## 2013-12-21 LAB — TYPE AND SCREEN
ABO/RH(D): A NEG
ANTIBODY SCREEN: NEGATIVE

## 2013-12-21 SURGERY — ARTHROPLASTY, HIP, TOTAL, ANTERIOR APPROACH
Anesthesia: Spinal | Site: Hip | Laterality: Left

## 2013-12-21 MED ORDER — DIPHENHYDRAMINE HCL 12.5 MG/5ML PO ELIX: 12.5000 mg | ORAL_SOLUTION | ORAL | Status: DC | PRN

## 2013-12-21 MED ORDER — SODIUM CHLORIDE 0.9 % IV SOLN
INTRAVENOUS | Status: DC
  Administered 2013-12-21 – 2013-12-22 (×2): via INTRAVENOUS

## 2013-12-21 MED ORDER — MIDAZOLAM HCL 5 MG/5ML IJ SOLN
INTRAMUSCULAR | Status: DC | PRN
  Administered 2013-12-21 (×2): 1 mg via INTRAVENOUS
  Administered 2013-12-21: 2 mg via INTRAVENOUS

## 2013-12-21 MED ORDER — METOCLOPRAMIDE HCL 5 MG/ML IJ SOLN: 5.0000 mg | Freq: Three times a day (TID) | INTRAMUSCULAR | Status: DC | PRN

## 2013-12-21 MED ORDER — OXYCODONE HCL ER 20 MG PO T12A
20.0000 mg | EXTENDED_RELEASE_TABLET | Freq: Two times a day (BID) | ORAL | Status: DC
  Administered 2013-12-21 – 2013-12-22 (×3): 20 mg via ORAL
  Filled 2013-12-21 (×3): qty 1

## 2013-12-21 MED ORDER — DOCUSATE SODIUM 100 MG PO CAPS
100.0000 mg | ORAL_CAPSULE | Freq: Two times a day (BID) | ORAL | Status: DC
  Administered 2013-12-21 – 2013-12-22 (×2): 100 mg via ORAL

## 2013-12-21 MED ORDER — ONDANSETRON HCL 4 MG/2ML IJ SOLN
INTRAMUSCULAR | Status: AC
  Filled 2013-12-21: qty 2

## 2013-12-21 MED ORDER — LACTATED RINGERS IV SOLN: INTRAVENOUS | Status: DC

## 2013-12-21 MED ORDER — LIDOCAINE HCL (CARDIAC) 20 MG/ML IV SOLN
INTRAVENOUS | Status: DC | PRN
  Administered 2013-12-21: 40 mg via INTRAVENOUS

## 2013-12-21 MED ORDER — LACTATED RINGERS IV SOLN
INTRAVENOUS | Status: DC | PRN
  Administered 2013-12-21 (×3): via INTRAVENOUS

## 2013-12-21 MED ORDER — PHENOL 1.4 % MT LIQD
1.0000 | OROMUCOSAL | Status: DC | PRN
  Filled 2013-12-21: qty 177

## 2013-12-21 MED ORDER — CEFAZOLIN SODIUM 1-5 GM-% IV SOLN
1.0000 g | Freq: Four times a day (QID) | INTRAVENOUS | Status: AC
  Administered 2013-12-21 (×2): 1 g via INTRAVENOUS
  Filled 2013-12-21 (×2): qty 50

## 2013-12-21 MED ORDER — METOCLOPRAMIDE HCL 10 MG PO TABS: 5.0000 mg | ORAL_TABLET | Freq: Three times a day (TID) | ORAL | Status: DC | PRN

## 2013-12-21 MED ORDER — EPHEDRINE SULFATE 50 MG/ML IJ SOLN
INTRAMUSCULAR | Status: AC
  Filled 2013-12-21: qty 1

## 2013-12-21 MED ORDER — HYDROMORPHONE HCL 1 MG/ML IJ SOLN
1.0000 mg | INTRAMUSCULAR | Status: DC | PRN
  Administered 2013-12-21: 1 mg via INTRAVENOUS
  Filled 2013-12-21: qty 1

## 2013-12-21 MED ORDER — METHOCARBAMOL 500 MG PO TABS
500.0000 mg | ORAL_TABLET | Freq: Four times a day (QID) | ORAL | Status: DC | PRN
  Administered 2013-12-21 – 2013-12-22 (×3): 500 mg via ORAL
  Filled 2013-12-21 (×3): qty 1

## 2013-12-21 MED ORDER — METHOCARBAMOL 1000 MG/10ML IJ SOLN
500.0000 mg | Freq: Four times a day (QID) | INTRAVENOUS | Status: DC | PRN
  Filled 2013-12-21: qty 5

## 2013-12-21 MED ORDER — ADULT MULTIVITAMIN W/MINERALS CH
1.0000 | ORAL_TABLET | Freq: Every day | ORAL | Status: DC
  Administered 2013-12-22: 1 via ORAL
  Filled 2013-12-21 (×2): qty 1

## 2013-12-21 MED ORDER — PROPOFOL 10 MG/ML IV BOLUS
INTRAVENOUS | Status: AC
  Filled 2013-12-21: qty 20

## 2013-12-21 MED ORDER — PROPOFOL INFUSION 10 MG/ML OPTIME
INTRAVENOUS | Status: DC | PRN
  Administered 2013-12-21: 50 ug/kg/min via INTRAVENOUS

## 2013-12-21 MED ORDER — TRANEXAMIC ACID 100 MG/ML IV SOLN
1000.0000 mg | INTRAVENOUS | Status: AC
  Administered 2013-12-21: 1000 mg via INTRAVENOUS
  Filled 2013-12-21: qty 10

## 2013-12-21 MED ORDER — PHENYLEPHRINE HCL 10 MG/ML IJ SOLN
INTRAMUSCULAR | Status: DC | PRN
  Administered 2013-12-21 (×2): 80 ug via INTRAVENOUS
  Administered 2013-12-21: 40 ug via INTRAVENOUS
  Administered 2013-12-21: 80 ug via INTRAVENOUS
  Administered 2013-12-21: 40 ug via INTRAVENOUS
  Administered 2013-12-21: 80 ug via INTRAVENOUS

## 2013-12-21 MED ORDER — CEFAZOLIN SODIUM-DEXTROSE 2-3 GM-% IV SOLR
2.0000 g | INTRAVENOUS | Status: AC
  Administered 2013-12-21: 2 g via INTRAVENOUS

## 2013-12-21 MED ORDER — FENTANYL CITRATE 0.05 MG/ML IJ SOLN
INTRAMUSCULAR | Status: DC | PRN
  Administered 2013-12-21: 100 ug via INTRAVENOUS

## 2013-12-21 MED ORDER — POLYETHYLENE GLYCOL 3350 17 G PO PACK: 17.0000 g | PACK | Freq: Every day | ORAL | Status: DC | PRN

## 2013-12-21 MED ORDER — CELECOXIB 200 MG PO CAPS
200.0000 mg | ORAL_CAPSULE | Freq: Two times a day (BID) | ORAL | Status: DC
  Administered 2013-12-21 – 2013-12-22 (×3): 200 mg via ORAL
  Filled 2013-12-21 (×4): qty 1

## 2013-12-21 MED ORDER — SODIUM CHLORIDE 0.9 % IJ SOLN
INTRAMUSCULAR | Status: AC
  Filled 2013-12-21: qty 10

## 2013-12-21 MED ORDER — MIDAZOLAM HCL 2 MG/2ML IJ SOLN
INTRAMUSCULAR | Status: AC
  Filled 2013-12-21: qty 2

## 2013-12-21 MED ORDER — CEFAZOLIN SODIUM-DEXTROSE 2-3 GM-% IV SOLR
INTRAVENOUS | Status: AC
  Filled 2013-12-21: qty 50

## 2013-12-21 MED ORDER — EPHEDRINE SULFATE 50 MG/ML IJ SOLN
INTRAMUSCULAR | Status: DC | PRN
  Administered 2013-12-21 (×2): 5 mg via INTRAVENOUS

## 2013-12-21 MED ORDER — ALPRAZOLAM 1 MG PO TABS
1.0000 mg | ORAL_TABLET | Freq: Every evening | ORAL | Status: DC | PRN
  Administered 2013-12-21: 1 mg via ORAL
  Filled 2013-12-21: qty 1

## 2013-12-21 MED ORDER — ACETAMINOPHEN 650 MG RE SUPP: 650.0000 mg | Freq: Four times a day (QID) | RECTAL | Status: DC | PRN

## 2013-12-21 MED ORDER — LIDOCAINE HCL (CARDIAC) 20 MG/ML IV SOLN
INTRAVENOUS | Status: AC
  Filled 2013-12-21: qty 5

## 2013-12-21 MED ORDER — SODIUM CHLORIDE 0.9 % IR SOLN
Status: DC | PRN
  Administered 2013-12-21: 1000 mL

## 2013-12-21 MED ORDER — ZOLPIDEM TARTRATE 5 MG PO TABS: 5.0000 mg | ORAL_TABLET | Freq: Every evening | ORAL | Status: DC | PRN

## 2013-12-21 MED ORDER — ACETAMINOPHEN 325 MG PO TABS: 650.0000 mg | ORAL_TABLET | Freq: Four times a day (QID) | ORAL | Status: DC | PRN

## 2013-12-21 MED ORDER — ONDANSETRON HCL 4 MG/2ML IJ SOLN: 4.0000 mg | Freq: Four times a day (QID) | INTRAMUSCULAR | Status: DC | PRN

## 2013-12-21 MED ORDER — DEXAMETHASONE SODIUM PHOSPHATE 10 MG/ML IJ SOLN
INTRAMUSCULAR | Status: DC | PRN
  Administered 2013-12-21: 10 mg via INTRAVENOUS

## 2013-12-21 MED ORDER — ROSUVASTATIN CALCIUM 20 MG PO TABS
20.0000 mg | ORAL_TABLET | Freq: Every day | ORAL | Status: DC
  Administered 2013-12-21: 20 mg via ORAL
  Filled 2013-12-21 (×2): qty 1

## 2013-12-21 MED ORDER — 0.9 % SODIUM CHLORIDE (POUR BTL) OPTIME
TOPICAL | Status: DC | PRN
  Administered 2013-12-21: 1000 mL

## 2013-12-21 MED ORDER — HYDROMORPHONE HCL 1 MG/ML IJ SOLN
0.2500 mg | INTRAMUSCULAR | Status: DC | PRN
Start: 2013-12-21 — End: 2013-12-21

## 2013-12-21 MED ORDER — ASPIRIN EC 325 MG PO TBEC
325.0000 mg | DELAYED_RELEASE_TABLET | Freq: Two times a day (BID) | ORAL | Status: DC
  Administered 2013-12-21 – 2013-12-22 (×2): 325 mg via ORAL
  Filled 2013-12-21 (×4): qty 1

## 2013-12-21 MED ORDER — DEXAMETHASONE SODIUM PHOSPHATE 10 MG/ML IJ SOLN
INTRAMUSCULAR | Status: AC
  Filled 2013-12-21: qty 1

## 2013-12-21 MED ORDER — PROPOFOL 10 MG/ML IV BOLUS
INTRAVENOUS | Status: DC | PRN
  Administered 2013-12-21 (×2): 20 mg via INTRAVENOUS

## 2013-12-21 MED ORDER — ONDANSETRON HCL 4 MG/2ML IJ SOLN
INTRAMUSCULAR | Status: DC | PRN
  Administered 2013-12-21: 4 mg via INTRAVENOUS

## 2013-12-21 MED ORDER — ONDANSETRON HCL 4 MG PO TABS: 4.0000 mg | ORAL_TABLET | Freq: Four times a day (QID) | ORAL | Status: DC | PRN

## 2013-12-21 MED ORDER — PHENYLEPHRINE 40 MCG/ML (10ML) SYRINGE FOR IV PUSH (FOR BLOOD PRESSURE SUPPORT)
PREFILLED_SYRINGE | INTRAVENOUS | Status: AC
  Filled 2013-12-21: qty 10

## 2013-12-21 MED ORDER — BUPIVACAINE HCL (PF) 0.5 % IJ SOLN
INTRAMUSCULAR | Status: AC
  Filled 2013-12-21: qty 30

## 2013-12-21 MED ORDER — FENTANYL CITRATE 0.05 MG/ML IJ SOLN
INTRAMUSCULAR | Status: AC
  Filled 2013-12-21: qty 2

## 2013-12-21 MED ORDER — MENTHOL 3 MG MT LOZG
1.0000 | LOZENGE | OROMUCOSAL | Status: DC | PRN
  Filled 2013-12-21: qty 9

## 2013-12-21 MED ORDER — ALUM & MAG HYDROXIDE-SIMETH 200-200-20 MG/5ML PO SUSP: 30.0000 mL | ORAL | Status: DC | PRN

## 2013-12-21 MED ORDER — OXYCODONE HCL 5 MG PO TABS
5.0000 mg | ORAL_TABLET | ORAL | Status: DC | PRN
  Administered 2013-12-21 – 2013-12-22 (×6): 10 mg via ORAL
  Filled 2013-12-21 (×6): qty 2

## 2013-12-21 SURGICAL SUPPLY — 41 items
APL SKNCLS STERI-STRIP NONHPOA (GAUZE/BANDAGES/DRESSINGS) ×1
BAG SPEC THK2 15X12 ZIP CLS (MISCELLANEOUS)
BAG ZIPLOCK 12X15 (MISCELLANEOUS) IMPLANT
BENZOIN TINCTURE PRP APPL 2/3 (GAUZE/BANDAGES/DRESSINGS) ×2 IMPLANT
BLADE SAW SGTL 18X1.27X75 (BLADE) ×2 IMPLANT
CAPT HIP PF COP ×2 IMPLANT
CELLS DAT CNTRL 66122 CELL SVR (MISCELLANEOUS) ×1 IMPLANT
COVER PERINEAL POST (MISCELLANEOUS) ×2 IMPLANT
DRAPE C-ARM 42X120 X-RAY (DRAPES) ×2 IMPLANT
DRAPE STERI IOBAN 125X83 (DRAPES) ×2 IMPLANT
DRAPE U-SHAPE 47X51 STRL (DRAPES) ×6 IMPLANT
DRSG AQUACEL AG ADV 3.5X10 (GAUZE/BANDAGES/DRESSINGS) ×2 IMPLANT
DURAPREP 26ML APPLICATOR (WOUND CARE) ×2 IMPLANT
ELECT BLADE TIP CTD 4 INCH (ELECTRODE) ×2 IMPLANT
ELECT REM PT RETURN 9FT ADLT (ELECTROSURGICAL) ×2
ELECTRODE REM PT RTRN 9FT ADLT (ELECTROSURGICAL) ×1 IMPLANT
FACESHIELD WRAPAROUND (MASK) ×8 IMPLANT
GAUZE XEROFORM 1X8 LF (GAUZE/BANDAGES/DRESSINGS) IMPLANT
GLOVE BIO SURGEON STRL SZ7.5 (GLOVE) ×2 IMPLANT
GLOVE BIOGEL PI IND STRL 8 (GLOVE) ×2 IMPLANT
GLOVE BIOGEL PI INDICATOR 8 (GLOVE) ×2
GLOVE ECLIPSE 8.0 STRL XLNG CF (GLOVE) ×2 IMPLANT
GOWN STRL REUS W/TWL XL LVL3 (GOWN DISPOSABLE) ×4 IMPLANT
HANDPIECE INTERPULSE COAX TIP (DISPOSABLE) ×2
KIT BASIN OR (CUSTOM PROCEDURE TRAY) ×2 IMPLANT
PACK TOTAL JOINT (CUSTOM PROCEDURE TRAY) ×2 IMPLANT
RTRCTR WOUND ALEXIS 18CM MED (MISCELLANEOUS) ×2
SET HNDPC FAN SPRY TIP SCT (DISPOSABLE) ×1 IMPLANT
STAPLER VISISTAT 35W (STAPLE) IMPLANT
STRIP CLOSURE SKIN 1/2X4 (GAUZE/BANDAGES/DRESSINGS) IMPLANT
SUT ETHIBOND NAB CT1 #1 30IN (SUTURE) ×2 IMPLANT
SUT MNCRL AB 4-0 PS2 18 (SUTURE) ×2 IMPLANT
SUT VIC AB 0 CT1 36 (SUTURE) ×2 IMPLANT
SUT VIC AB 1 CT1 36 (SUTURE) ×2 IMPLANT
SUT VIC AB 2-0 CT1 27 (SUTURE) ×4
SUT VIC AB 2-0 CT1 TAPERPNT 27 (SUTURE) ×2 IMPLANT
TAPE STRIPS DRAPE STRL (GAUZE/BANDAGES/DRESSINGS) ×2 IMPLANT
TOWEL OR 17X26 10 PK STRL BLUE (TOWEL DISPOSABLE) ×2 IMPLANT
TOWEL OR NON WOVEN STRL DISP B (DISPOSABLE) ×2 IMPLANT
TRAY FOLEY CATH 14FRSI W/METER (CATHETERS) IMPLANT
TRAY FOLEY CATH 16FRSI W/METER (SET/KITS/TRAYS/PACK) ×2 IMPLANT

## 2013-12-21 NOTE — H&P (Signed)
TOTAL HIP ADMISSION H&P  Patient is admitted for left total hip arthroplasty.  Subjective:  Chief Complaint: left hip pain  HPI: Eric Delgado, 58 y.o. male, has a history of pain and functional disability in the left hip(s) due to primary osteo- arthritis and patient has failed non-surgical conservative treatments for greater than 12 weeks to include NSAID's and/or analgesics, corticosteriod injections, flexibility and strengthening excercises, weight reduction as appropriate and activity modification.  Onset of symptoms was gradual starting 3 years ago with gradually worsening course since that time.The patient noted no past surgery on the left hip(s).  Patient currently rates pain in the left hip at 10 out of 10 with activity. Patient has night pain, worsening of pain with activity and weight bearing, trendelenberg gait, pain that interfers with activities of daily living and pain with passive range of motion. Patient has evidence of subchondral sclerosis, periarticular osteophytes and joint space narrowing by imaging studies. This condition presents safety issues increasing the risk of falls.  There is no current active infection.  Patient Active Problem List   Diagnosis Date Noted  . Arthritis of left hip 12/21/2013  . Routine health maintenance 10/16/2010  . Ex-smoker 10/16/2010  . SCIATICA, RIGHT 10/12/2007  . HYPERLIPIDEMIA 12/17/2006  . ANXIETY 12/17/2006  . HYPERTENSION 12/17/2006  . Carpal tunnel syndrome 12/17/2006   Past Medical History  Diagnosis Date  . Hypertension   . Hyperlipidemia   . Arthritis   . Difficulty sleeping     takes xanax for sleep    Past Surgical History  Procedure Laterality Date  . Total hip arthroplasty Right Spring '12    Dr. Alvan Dame - ceramic ball, fiber stem  . Vasectomy    . Appendectomy  1975  . Colonoscopy  10/2010    WNL, rpt 10 yrs (Dr. Glennon [facility])  . Rotator cuff repair      left     Prescriptions prior to admission  Medication Sig Dispense Refill Last Dose  . ALPRAZolam (XANAX) 1 MG tablet Take 1 tablet (1 mg total) by mouth at bedtime as needed for sleep. 30 tablet 3 12/20/2013 at pm  . Coenzyme Q10 (CO Q 10 PO) Take 1 tablet by mouth every morning.   Past Week at Unknown time  . HYDROcodone-acetaminophen (NORCO/VICODIN) 5-325 MG per tablet Take 1 tablet by mouth daily as needed for moderate pain.    Past Week at Unknown time  . ibuprofen (ADVIL,MOTRIN) 800 MG tablet Take 800 mg by mouth 3 (three) times daily as needed for mild pain or moderate pain.   Past Month at Unknown time  . lisinopril-hydrochlorothiazide (PRINZIDE,ZESTORETIC) 20-25 MG per tablet Take 1 tablet by mouth every morning.   12/20/2013 at Unknown time  . Multiple Vitamin (MULTIVITAMIN WITH MINERALS) TABS tablet Take 1 tablet by mouth every morning.   Past Week at Unknown time  . Naproxen Sodium (ALEVE) 220 MG CAPS Take 440 mg by mouth daily as needed (pain.).   Past Month at Unknown time  . rosuvastatin (CRESTOR) 40 MG tablet Take 20 mg by mouth at bedtime.   Past Week at Unknown time   Allergies  Allergen Reactions  . Codeine Itching  . Propoxyphene N-Acetaminophen   . Tramadol     Sick on Stomach + blood pressure goes up.     History  Substance Use Topics  . Smoking status: Former Smoker -- 0.50 packs/day for 30 years    Types: Cigarettes    Quit  date: 01/15/2010  . Smokeless tobacco: Never Used  . Alcohol Use: 0.0 oz/week     Comment: occ    Family History  Problem Relation Age of Onset  . Macular degeneration Mother   . Hypertension Mother   . Hyperlipidemia Mother   . Cancer Mother 14    lung (small cell), smoker   . Diabetes Neg Hx   . Stroke Maternal Aunt   . CAD Neg Hx      Review of Systems  Musculoskeletal: Positive for back pain and joint pain.  All other systems reviewed and are negative.   Objective:  Physical Exam  Constitutional: He is oriented to person, place,  and time. He appears well-developed and well-nourished.  HENT:  Head: Normocephalic and atraumatic.  Eyes: EOM are normal. Pupils are equal, round, and reactive to light.  Neck: Normal range of motion. Neck supple.  Cardiovascular: Normal rate and regular rhythm.   Respiratory: Effort normal and breath sounds normal.  GI: Soft. Bowel sounds are normal.  Musculoskeletal:       Left hip: He exhibits decreased range of motion, decreased strength and bony tenderness.  Neurological: He is alert and oriented to person, place, and time.  Skin: Skin is warm and dry.  Psychiatric: He has a normal mood and affect.    Vital signs in last 24 hours: Temp:  [98.2 F (36.8 C)] 98.2 F (36.8 C) (11/06 0551) Pulse Rate:  [88] 88 (11/06 0551) Resp:  [18] 18 (11/06 0551) BP: (128)/(77) 128/77 mmHg (11/06 0551) SpO2:  [97 %] 97 % (11/06 0551)  Labs:   Estimated body mass index is 29.67 kg/(m^2) as calculated from the following:   Height as of 12/14/13: 5\' 10"  (1.778 m).   Weight as of 05/25/13: 93.781 kg (206 lb 12 oz).   Imaging Review Plain radiographs demonstrate severe degenerative joint disease of the left hip(s). The bone quality appears to be excellent for age and reported activity level.  Assessment/Plan:  End stage arthritis, left hip(s)  The patient history, physical examination, clinical judgement of the provider and imaging studies are consistent with end stage degenerative joint disease of the left hip(s) and total hip arthroplasty is deemed medically necessary. The treatment options including medical management, injection therapy, arthroscopy and arthroplasty were discussed at length. The risks and benefits of total hip arthroplasty were presented and reviewed. The risks due to aseptic loosening, infection, stiffness, dislocation/subluxation,  thromboembolic complications and other imponderables were discussed.  The patient acknowledged the explanation, agreed to proceed with the plan  and consent was signed. Patient is being admitted for inpatient treatment for surgery, pain control, PT, OT, prophylactic antibiotics, VTE prophylaxis, progressive ambulation and ADL's and discharge planning.The patient is planning to be discharged home with home health services

## 2013-12-21 NOTE — Evaluation (Signed)
Physical Therapy Evaluation Patient Details Name: Eric Delgado MRN: [id number] DOB: [birth year] Today's Date: 12/21/2013   History of Present Illness  L THR 12/21/13; R THR 2012  Clinical Impression  Pt s/p R THR presents with decreased R LE strength/ROM and post op pain limiting functional mobility.  Pt should progress to d/c home with family assist.      Follow Up Recommendations Home health PT (Pt prefers to self rehab vs HHPT.  Will check with DR)    Equipment Recommendations  None recommended by PT    Recommendations for Other Services OT consult     Precautions / Restrictions Precautions Precautions: Fall Restrictions Weight Bearing Restrictions: No Other Position/Activity Restrictions: WBAT      Mobility  Bed Mobility Overal bed mobility: Needs Assistance Bed Mobility: Supine to Sit     Supine to sit: Min assist     General bed mobility comments: cues for sequence and use of R LE to self assist  Transfers Overall transfer level: Needs assistance Equipment used: Rolling walker (2 wheeled) Transfers: Sit to/from Stand Sit to Stand: Min assist         General transfer comment: cues for LE management and use of UEs to self assist  Ambulation/Gait Ambulation/Gait assistance: Min assist Ambulation Distance (Feet): 75 Feet Assistive device: Rolling walker (2 wheeled) Gait Pattern/deviations: Step-to pattern;Decreased step length - right;Decreased step length - left;Shuffle;Antalgic     General Gait Details: cues for sequence, posture and position from RW  Stairs            Wheelchair Mobility    Modified Rankin (Stroke Patients Only)       Balance                                             Pertinent Vitals/Pain Pain Assessment: 0-10 Pain Score: 6  Pain Location: L hip Pain Descriptors / Indicators: Aching;Sore Pain Intervention(s): Limited activity within patient's tolerance;Monitored during session;Premedicated  before session;Ice applied    Home Living Family/patient expects to be discharged to:: Private residence Living Arrangements: Spouse/significant other Available Help at Discharge: Family Type of Home: House Home Access: Stairs to enter Entrance Stairs-Rails: Chemical engineer of Steps: 3 Home Layout: One level Home Equipment: Environmental consultant - 2 wheels;Crutches      Prior Function Level of Independence: Independent               Hand Dominance   Dominant Hand: Right    Extremity/Trunk Assessment   Upper Extremity Assessment: Overall WFL for tasks assessed           Lower Extremity Assessment: LLE deficits/detail   LLE Deficits / Details: 2+/5 hip strength with AAROM at hip to 80 flex and 15 abd  Cervical / Trunk Assessment: Normal  Communication   Communication: No difficulties  Cognition Arousal/Alertness: Awake/alert Behavior During Therapy: WFL for tasks assessed/performed Overall Cognitive Status: Within Functional Limits for tasks assessed                      General Comments      Exercises Total Joint Exercises Ankle Circles/Pumps: AROM;Both;15 reps;Supine Quad Sets: AROM;Both;10 reps;Supine Heel Slides: AAROM;Left;15 reps;Supine Hip ABduction/ADduction: AAROM;Left;10 reps;Supine      Assessment/Plan    PT Assessment Patient needs continued PT services  PT Diagnosis Difficulty walking   PT Problem List Decreased strength;Decreased  range of motion;Decreased activity tolerance;Decreased mobility;Decreased knowledge of use of DME;Pain  PT Treatment Interventions DME instruction;Gait training;Stair training;Functional mobility training;Therapeutic activities;Therapeutic exercise;Patient/family education   PT Goals (Current goals can be found in the Care Plan section) Acute Rehab PT Goals Patient Stated Goal: Resume previous lifestyle with decreased pain PT Goal Formulation: With patient Time For Goal Achievement:  12/26/13 Potential to Achieve Goals: Good    Frequency 7X/week   Barriers to discharge        Co-evaluation               End of Session Equipment Utilized During Treatment: Gait belt Activity Tolerance: Patient tolerated treatment well Patient left: in chair;with call bell/phone within reach;with family/visitor present Nurse Communication: Mobility status         Time: 1515-1600 PT Time Calculation (min): 45 min   Charges:   PT Evaluation $Initial PT Evaluation Tier I: 1 Procedure PT Treatments $Gait Training: 8-22 mins $Therapeutic Exercise: 8-22 mins   PT G Codes:          Eric Delgado 12/21/2013, 5:15 PM

## 2013-12-21 NOTE — Op Note (Deleted)
NAME:  Eric Delgado, Eric Delgado NO.:  [id number]  MEDICAL RECORD NO.:  [id number]  LOCATION:                               FACILITY:  [facility]  PHYSICIAN:  Lind Guest. Ninfa Linden, M.D.DATE OF BIRTH:  06/25/1955  DATE OF PROCEDURE:  12/21/2013 DATE OF DISCHARGE:  12/22/2013                              OPERATIVE REPORT   PREOPERATIVE DIAGNOSIS:  Primary osteoarthritis and degenerative joint disease, left hip.  POSTOPERATIVE DIAGNOSIS:  Primary osteoarthritis and degenerative joint disease, left hip.  PROCEDURE:  Left total hip arthroplasty, direct anterior approach.  IMPLANTS:  DePuy Sector Gription acetabular component size 56 with apex hole eliminator guide and a single screw, size 36+ 4 neutral polyethylene liner, size 13 Corail femoral component with standard offset, size 36+ 1.5 ceramic hip ball.  SURGEON:  Lind Guest. Ninfa Linden, MD  ASSISTANT:  Erskine Emery, P.A.  ANESTHESIA:  Spinal.  BLOOD LOSS:  Less than 500 mL.  ANTIBIOTICS:  2 g IV Ancef.  COMPLICATIONS:  None.  INDICATIONS:  Eric Delgado is a 58 year old gentleman with known primary osteoarthritis involving his left hip.  He has had a previous successful right total hip arthroplasty and now due to debilitating pain of his left hip, the impact on his mobility and quality of life, he wished to proceed with a left total hip arthroplasty.  He has tried and failed all modes of conservative treatment.  He has x-rays that show significant sclerotic changes, joint space narrowing, and periarticular osteophytes of left hip.  He understands the risks of acute blood loss anemia, nerve and vessel injury, fracture, infection, dislocation, DVT and he understands the goals are decreased pain, improved mobility, and overall improved quality of life.  PROCEDURE DESCRIPTION:  After informed consent was obtained, appropriate left hip was marked.  He was brought to the operating room and spinal anesthesia was  obtained while he was on the stretcher.  Foley catheter was placed and both feet had traction boots applied to them.  Next, he was placed supine on the Hana fracture table with the perineal post in place and both legs in, in-line skeletal traction devices, but no traction applied.  His left operative hip was then prepped and draped with DuraPrep and sterile drapes.  Time-out was called.  He was identified as the correct patient and correct left hip.  I then made an incision inferior and posterior to the anterior and superior iliac spine and carried this obliquely down the leg.  We dissected down the tensor fascia lata muscle and tensor fascia was then divided longitudinally, so we proceeded with a direct anterior approach to the hip.  A Cobra retractor was placed around the lateral neck and up underneath the rectus femoris under the medial neck.  I cauterized the lateral femoral and circumflex vessels and then opened up our hip capsule in a L-type format finding a joint effusion and significant periarticular osteophytes.  We placed the Cobra retractors within the hip capsule and then made our femoral neck cut with an oscillating saw just proximal to the lesser trochanter and completed this with an osteotome.  We then placed a corkscrew guide in the femoral head and removed the femoral head in  its entirety and found it to be devoid of cartilage.  We then cleaned the acetabulum debris including remnants of acetabular labrum. We then began reaming under 2 mm increments from a size 42 all the way up to a size 56 with all reamers under direct visualization and the last reamer under direct fluoroscopy, so we could obtain our depth of reaming, inclination, and anteversion.  Once we were done with this, we placed the real DePuy Sector Gription acetabular component size 56 and the apex guide and a single screw.  We then placed the real 36+ 4 neutral polyethylene liner for that size acetabular  component. Attention was then turned to the femur.  With the leg externally rotated to 100 degrees and extended and adducted, we were able to replace a Mueller retractor medially and a Hohmann retractor behind the greater trochanter.  I released the lateral joint capsule and used a box cutting osteotome to enter the femoral canal and a rongeur to lateralize.  I then began broaching with a size 8 broach all the way up to a size 13 broach.  The 13 was felt to be stable and tight.  We trialed a 36+ 1.5 hip ball.  We brought the leg back over and up with traction and internal rotation, reducing the pelvis.  It was stable on internal and external rotation with minimal shuck.  His offsets and leg lengths were measured and they were equal under direct fluoroscopy.  We then dislocated the hip and removed the trial components.  We placed a real size 13 Corail femoral component with standard offset and the real 36+ 1.5 ceramic hip ball reduced this back and the acetabulum was stable. We then copiously irrigated the soft tissues with normal saline solution using pulsatile lavage.  We closed the joint capsule with interrupted #1 Ethibond suture followed by running #1 Vicryl in the tensor fascia, 0 Vicryl in the deep tissue, 2-0 Vicryl in the subcutaneous tissue, 4-0 Monocryl subcuticular stitch and Steri-Strips on the skin.  An Aquacel dressing was applied.  He was taken off the Hana table and taken to recovery room in stable condition.  All final counts were correct. There were no complications noted.  Of note, Erskine Emery, P.A. assisted during the entire case and his assistance was crucial for facilitating this case at all levels.     Lind Guest. Ninfa Linden, M.D.     CYB/MEDQ  D:  12/21/2013  T:  12/21/2013  Job:  [id number]

## 2013-12-21 NOTE — Op Note (Deleted)
NAME:  Eric Delgado, Eric Delgado NO.:  [id number]  MEDICAL RECORD NO.:  [id number]  LOCATION:                               FACILITY:  [facility]  PHYSICIAN:  Lind Guest. Ninfa Linden, M.D.DATE OF BIRTH:  1956-01-26  DATE OF PROCEDURE:  12/21/2013 DATE OF DISCHARGE:  12/22/2013                              OPERATIVE REPORT   PREOPERATIVE DIAGNOSIS:  Primary osteoarthritis and degenerative joint disease, left hip.  POSTOPERATIVE DIAGNOSIS:  Primary osteoarthritis and degenerative joint disease, left hip.  PROCEDURE:  Left total hip arthroplasty, direct anterior approach.  IMPLANTS:  DePuy Sector Gription acetabular component size 56 with apex hole eliminator guide and a single screw, size 36+ 4 neutral polyethylene liner, size 13 Corail femoral component with standard offset, size 36+ 1.5 ceramic hip ball.  SURGEON:  Lind Guest. Ninfa Linden, MD  ASSISTANT:  Erskine Emery, P.A.  ANESTHESIA:  Spinal.  BLOOD LOSS:  Less than 500 mL.  ANTIBIOTICS:  2 g IV Ancef.  COMPLICATIONS:  None.  INDICATIONS:  Mr. Mercer is a 58 year old gentleman with known primary osteoarthritis involving his left hip.  He has had a previous successful right total hip arthroplasty and now due to debilitating pain of his left hip, the impact on his mobility and quality of life, he wished to proceed with a left total hip arthroplasty.  He has tried and failed all modes of conservative treatment.  He has x-rays that show significant sclerotic changes, joint space narrowing, and periarticular osteophytes of left hip.  He understands the risks of acute blood loss anemia, nerve and vessel injury, fracture, infection, dislocation, DVT and he understands the goals are decreased pain, improved mobility, and overall improved quality of life.  PROCEDURE DESCRIPTION:  After informed consent was obtained, appropriate left hip was marked.  He was brought to the operating room and spinal anesthesia was  obtained while he was on the stretcher.  Foley catheter was placed and both feet had traction boots applied to them.  Next, he was placed supine on the Hana fracture table with the perineal post in place and both legs in, in-line skeletal traction devices, but no traction applied.  His left operative hip was then prepped and draped with DuraPrep and sterile drapes.  Time-out was called.  He was identified as the correct patient and correct left hip.  I then made an incision inferior and posterior to the anterior and superior iliac spine and carried this obliquely down the leg.  We dissected down the tensor fascia lata muscle and tensor fascia was then divided longitudinally, so we proceeded with a direct anterior approach to the hip.  A Cobra retractor was placed around the lateral neck and up underneath the rectus femoris under the medial neck.  I cauterized the lateral femoral and circumflex vessels and then opened up our hip capsule in a L-type format finding a joint effusion and significant periarticular osteophytes.  We placed the Cobra retractors within the hip capsule and then made our femoral neck cut with an oscillating saw just proximal to the lesser trochanter and completed this with an osteotome.  We then placed a corkscrew guide in the femoral head and removed the femoral head in  its entirety and found it to be devoid of cartilage.  We then cleaned the acetabulum debris including remnants of acetabular labrum. We then began reaming under 2 mm increments from a size 42 all the way up to a size 56 with all reamers under direct visualization and the last reamer under direct fluoroscopy, so we could obtain our depth of reaming, inclination, and anteversion.  Once we were done with this, we placed the real DePuy Sector Gription acetabular component size 56 and the apex guide and a single screw.  We then placed the real 36+ 4 neutral polyethylene liner for that size acetabular  component. Attention was then turned to the femur.  With the leg externally rotated to 100 degrees and extended and adducted, we were able to replace a Mueller retractor medially and a Hohmann retractor behind the greater trochanter.  I released the lateral joint capsule and used a box cutting osteotome to enter the femoral canal and a rongeur to lateralize.  I then began broaching with a size 8 broach all the way up to a size 13 broach.  The 13 was felt to be stable and tight.  We trialed a 36+ 1.5 hip ball.  We brought the leg back over and up with traction and internal rotation, reducing the pelvis.  It was stable on internal and external rotation with minimal shuck.  His offsets and leg lengths were measured and they were equal under direct fluoroscopy.  We then dislocated the hip and removed the trial components.  We placed a real size 13 Corail femoral component with standard offset and the real 36+ 1.5 ceramic hip ball reduced this back and the acetabulum was stable. We then copiously irrigated the soft tissues with normal saline solution using pulsatile lavage.  We closed the joint capsule with interrupted #1 Ethibond suture followed by running #1 Vicryl in the tensor fascia, 0 Vicryl in the deep tissue, 2-0 Vicryl in the subcutaneous tissue, 4-0 Monocryl subcuticular stitch and Steri-Strips on the skin.  An Aquacel dressing was applied.  He was taken off the Hana table and taken to recovery room in stable condition.  All final counts were correct. There were no complications noted.  Of note, Erskine Emery, P.A. assisted during the entire case and his assistance was crucial for facilitating this case at all levels.     Lind Guest. Ninfa Linden, M.D.     CYB/MEDQ  D:  12/21/2013  T:  12/21/2013  Job:  [id number]

## 2013-12-21 NOTE — Anesthesia Procedure Notes (Signed)
Spinal Patient location during procedure: OR End time: 12/21/2013 7:47 AM Staffing Resident/CRNA: Noralyn Pick Performed by: anesthesiologist and resident/CRNA  Preanesthetic Checklist Completed: patient identified, site marked, surgical consent, pre-op evaluation, timeout performed, IV checked, risks and benefits discussed and monitors and equipment checked Spinal Block Patient position: sitting Prep: Betadine Patient monitoring: heart rate, continuous pulse ox and blood pressure Approach: midline Location: L2-3 Injection technique: single-shot Needle Needle type: Sprotte and Pencil-Tip  Needle gauge: 24 G Needle length: 9 cm Assessment Sensory level: T6 Additional Notes Expiration date of kit checked and confirmed. Patient tolerated procedure well, without complications.

## 2013-12-21 NOTE — Op Note (Signed)
NAME:  Eric Delgado, Eric Delgado NO.:  [id number]  MEDICAL RECORD NO.:  [id number]  LOCATION:                               FACILITY:  [facility]  PHYSICIAN:  Lind Guest. Ninfa Linden, M.D.DATE OF BIRTH:  02-Feb-1956  DATE OF PROCEDURE:  12/21/2013 DATE OF DISCHARGE:  12/22/2013                              OPERATIVE REPORT   PREOPERATIVE DIAGNOSIS:  Primary osteoarthritis and degenerative joint disease, left hip.  POSTOPERATIVE DIAGNOSIS:  Primary osteoarthritis and degenerative joint disease, left hip.  PROCEDURE:  Left total hip arthroplasty, direct anterior approach.  IMPLANTS:  DePuy Sector Gription acetabular component size 56 with apex hole eliminator guide and a single screw, size 36+ 4 neutral polyethylene liner, size 13 Corail femoral component with standard offset, size 36+ 1.5 ceramic hip ball.  SURGEON:  Lind Guest. Ninfa Linden, MD  ASSISTANT:  Erskine Emery, P.A.  ANESTHESIA:  Spinal.  BLOOD LOSS:  Less than 500 mL.  ANTIBIOTICS:  2 g IV Ancef.  COMPLICATIONS:  None.  INDICATIONS:  Mr. Camper is a 58 year old gentleman with known primary osteoarthritis involving his left hip.  He has had a previous successful right total hip arthroplasty and now due to debilitating pain of his left hip, the impact on his mobility and quality of life, he wished to proceed with a left total hip arthroplasty.  He has tried and failed all modes of conservative treatment.  He has x-rays that show significant sclerotic changes, joint space narrowing, and periarticular osteophytes of left hip.  He understands the risks of acute blood loss anemia, nerve and vessel injury, fracture, infection, dislocation, DVT and he understands the goals are decreased pain, improved mobility, and overall improved quality of life.  PROCEDURE DESCRIPTION:  After informed consent was obtained, appropriate left hip was marked.  He was brought to the operating room and spinal anesthesia was  obtained while he was on the stretcher.  Foley catheter was placed and both feet had traction boots applied to them.  Next, he was placed supine on the Hana fracture table with the perineal post in place and both legs in, in-line skeletal traction devices, but no traction applied.  His left operative hip was then prepped and draped with DuraPrep and sterile drapes.  Time-out was called.  He was identified as the correct patient and correct left hip.  I then made an incision inferior and posterior to the anterior and superior iliac spine and carried this obliquely down the leg.  We dissected down the tensor fascia lata muscle and tensor fascia was then divided longitudinally, so we proceeded with a direct anterior approach to the hip.  A Cobra retractor was placed around the lateral neck and up underneath the rectus femoris under the medial neck.  I cauterized the lateral femoral and circumflex vessels and then opened up our hip capsule in a L-type format finding a joint effusion and significant periarticular osteophytes.  We placed the Cobra retractors within the hip capsule and then made our femoral neck cut with an oscillating saw just proximal to the lesser trochanter and completed this with an osteotome.  We then placed a corkscrew guide in the femoral head and removed the femoral head in  its entirety and found it to be devoid of cartilage.  We then cleaned the acetabulum debris including remnants of acetabular labrum. We then began reaming under 2 mm increments from a size 42 all the way up to a size 56 with all reamers under direct visualization and the last reamer under direct fluoroscopy, so we could obtain our depth of reaming, inclination, and anteversion.  Once we were done with this, we placed the real DePuy Sector Gription acetabular component size 56 and the apex guide and a single screw.  We then placed the real 36+ 4 neutral polyethylene liner for that size acetabular  component. Attention was then turned to the femur.  With the leg externally rotated to 100 degrees and extended and adducted, we were able to replace a Mueller retractor medially and a Hohmann retractor behind the greater trochanter.  I released the lateral joint capsule and used a box cutting osteotome to enter the femoral canal and a rongeur to lateralize.  I then began broaching with a size 8 broach all the way up to a size 13 broach.  The 13 was felt to be stable and tight.  We trialed a 36+ 1.5 hip ball.  We brought the leg back over and up with traction and internal rotation, reducing the pelvis.  It was stable on internal and external rotation with minimal shuck.  His offsets and leg lengths were measured and they were equal under direct fluoroscopy.  We then dislocated the hip and removed the trial components.  We placed a real size 13 Corail femoral component with standard offset and the real 36+ 1.5 ceramic hip ball reduced this back and the acetabulum was stable. We then copiously irrigated the soft tissues with normal saline solution using pulsatile lavage.  We closed the joint capsule with interrupted #1 Ethibond suture followed by running #1 Vicryl in the tensor fascia, 0 Vicryl in the deep tissue, 2-0 Vicryl in the subcutaneous tissue, 4-0 Monocryl subcuticular stitch and Steri-Strips on the skin.  An Aquacel dressing was applied.  He was taken off the Hana table and taken to recovery room in stable condition.  All final counts were correct. There were no complications noted.  Of note, Erskine Emery, P.A. assisted during the entire case and his assistance was crucial for facilitating this case at all levels.     Lind Guest. Ninfa Linden, M.D.     CYB/MEDQ  D:  12/21/2013  T:  12/21/2013  Job:  [id number]

## 2013-12-21 NOTE — Plan of Care (Signed)
Problem: Phase I Progression Outcomes Goal: CMS/Neurovascular status WDL Outcome: Completed/Met Date Met:  12/21/13 Goal: Pain controlled with appropriate interventions Outcome: Completed/Met Date Met:  12/21/13 Goal: Dangle or out of bed evening of surgery Outcome: Completed/Met Date Met:  12/21/13 Goal: Initial discharge plan identified Outcome: Completed/Met Date Met:  12/21/13 Goal: Hemodynamically stable Outcome: Completed/Met Date Met:  12/21/13 Goal: Other Phase I Outcomes/Goals Outcome: Completed/Met Date Met:  12/21/13

## 2013-12-21 NOTE — Anesthesia Postprocedure Evaluation (Signed)
  Anesthesia Post-op Note  Patient: Eric Delgado  Procedure(s) Performed: Procedure(s) (LRB): LEFT TOTAL HIP ARTHROPLASTY ANTERIOR APPROACH (Left)  Patient Location: PACU  Anesthesia Type: Spinal  Level of Consciousness: awake and alert   Airway and Oxygen Therapy: Patient Spontanous Breathing  Post-op Pain: mild  Post-op Assessment: Post-op Vital signs reviewed, Patient's Cardiovascular Status Stable, Respiratory Function Stable, Patent Airway and No signs of Nausea or vomiting  Last Vitals:  Filed Vitals:   12/21/13 1050  BP: 124/82  Pulse: 78  Temp: 36.4 C  Resp: 16    Post-op Vital Signs: stable   Complications: No apparent anesthesia complications

## 2013-12-21 NOTE — Care Management Note (Signed)
    Page 1 of 2   12/21/2013     12:16:12 PM CARE MANAGEMENT NOTE 12/21/2013  Patient:  LILTON, PARE   Account Number:  [id number]  Date Initiated:  12/21/2013  Documentation initiated by:  DAVIS,RHONDA  Subjective/Objective Assessment:   total hip ant approach left     Action/Plan:   home when stable   Anticipated DC Date:  12/24/2013   Anticipated DC Plan:  [place] referral  NA      DC Planning Services  CM consult      [facility] Choice  NA   Choice offered to / List presented to:  C-1 Patient   DME arranged  [place]      DME agency  [place] arranged  HH-2 PT      Status of service:  In process, will continue to follow Medicare Important Message given?   (If response is "NO", the following Medicare IM given date fields will be blank) Date Medicare IM given:   Medicare IM given by:   Date Additional Medicare IM given:   Additional Medicare IM given by:    Discharge Disposition:    Per UR Regulation:  Reviewed for med. necessity/level of care/duration of stay  If discussed at [place] of Stay Meetings, dates discussed:    Comments:  [id number]/Rhonda Rosana Hoes, RN, BSN, CCM Chart reviewed. Discharge needs and patient's stay to be reviewed and followed by case manager.

## 2013-12-21 NOTE — Progress Notes (Signed)
Accidental abrasion created by clippers blade during clip/prep for L hip surgery in short stay dept.  Superficial. Covered with tegaderm by Jacqlyn Larsen, RN.  Pt denies concerns.  Coolidge Breeze, RN 12/21/2013

## 2013-12-21 NOTE — Op Note (Deleted)
NAME:  Eric Delgado, Eric Delgado NO.:  [id number]  MEDICAL RECORD NO.:  [id number]  LOCATION:                               FACILITY:  [facility]  PHYSICIAN:  Lind Guest. Ninfa Linden, M.D.DATE OF BIRTH:  [birth year]  DATE OF PROCEDURE:  12/21/2013 DATE OF DISCHARGE:  12/22/2013                              OPERATIVE REPORT   PREOPERATIVE DIAGNOSIS:  Primary osteoarthritis and degenerative joint disease, left hip.  POSTOPERATIVE DIAGNOSIS:  Primary osteoarthritis and degenerative joint disease, left hip.  PROCEDURE:  Left total hip arthroplasty, direct anterior approach.  IMPLANTS:  DePuy Sector Gription acetabular component size 56 with apex hole eliminator guide and a single screw, size 36+ 4 neutral polyethylene liner, size 13 Corail femoral component with standard offset, size 36+ 1.5 ceramic hip ball.  SURGEON:  Lind Guest. Ninfa Linden, MD  ASSISTANT:  Erskine Emery, P.A.  ANESTHESIA:  Spinal.  BLOOD LOSS:  Less than 500 mL.  ANTIBIOTICS:  2 g IV Ancef.  COMPLICATIONS:  None.  INDICATIONS:  Mr. Tisdale is a 58 year old gentleman with known primary osteoarthritis involving his left hip.  He has had a previous successful right total hip arthroplasty and now due to debilitating pain of his left hip, the impact on his mobility and quality of life, he wished to proceed with a left total hip arthroplasty.  He has tried and failed all modes of conservative treatment.  He has x-rays that show significant sclerotic changes, joint space narrowing, and periarticular osteophytes of left hip.  He understands the risks of acute blood loss anemia, nerve and vessel injury, fracture, infection, dislocation, DVT and he understands the goals are decreased pain, improved mobility, and overall improved quality of life.  PROCEDURE DESCRIPTION:  After informed consent was obtained, appropriate left hip was marked.  He was brought to the operating room and spinal anesthesia was  obtained while he was on the stretcher.  Foley catheter was placed and both feet had traction boots applied to them.  Next, he was placed supine on the Hana fracture table with the perineal post in place and both legs in, in-line skeletal traction devices, but no traction applied.  His left operative hip was then prepped and draped with DuraPrep and sterile drapes.  Time-out was called.  He was identified as the correct patient and correct left hip.  I then made an incision inferior and posterior to the anterior and superior iliac spine and carried this obliquely down the leg.  We dissected down the tensor fascia lata muscle and tensor fascia was then divided longitudinally, so we proceeded with a direct anterior approach to the hip.  A Cobra retractor was placed around the lateral neck and up underneath the rectus femoris under the medial neck.  I cauterized the lateral femoral and circumflex vessels and then opened up our hip capsule in a L-type format finding a joint effusion and significant periarticular osteophytes.  We placed the Cobra retractors within the hip capsule and then made our femoral neck cut with an oscillating saw just proximal to the lesser trochanter and completed this with an osteotome.  We then placed a corkscrew guide in the femoral head and removed the femoral head in  its entirety and found it to be devoid of cartilage.  We then cleaned the acetabulum debris including remnants of acetabular labrum. We then began reaming under 2 mm increments from a size 42 all the way up to a size 56 with all reamers under direct visualization and the last reamer under direct fluoroscopy, so we could obtain our depth of reaming, inclination, and anteversion.  Once we were done with this, we placed the real DePuy Sector Gription acetabular component size 56 and the apex guide and a single screw.  We then placed the real 36+ 4 neutral polyethylene liner for that size acetabular  component. Attention was then turned to the femur.  With the leg externally rotated to 100 degrees and extended and adducted, we were able to replace a Mueller retractor medially and a Hohmann retractor behind the greater trochanter.  I released the lateral joint capsule and used a box cutting osteotome to enter the femoral canal and a rongeur to lateralize.  I then began broaching with a size 8 broach all the way up to a size 13 broach.  The 13 was felt to be stable and tight.  We trialed a 36+ 1.5 hip ball.  We brought the leg back over and up with traction and internal rotation, reducing the pelvis.  It was stable on internal and external rotation with minimal shuck.  His offsets and leg lengths were measured and they were equal under direct fluoroscopy.  We then dislocated the hip and removed the trial components.  We placed a real size 13 Corail femoral component with standard offset and the real 36+ 1.5 ceramic hip ball reduced this back and the acetabulum was stable. We then copiously irrigated the soft tissues with normal saline solution using pulsatile lavage.  We closed the joint capsule with interrupted #1 Ethibond suture followed by running #1 Vicryl in the tensor fascia, 0 Vicryl in the deep tissue, 2-0 Vicryl in the subcutaneous tissue, 4-0 Monocryl subcuticular stitch and Steri-Strips on the skin.  An Aquacel dressing was applied.  He was taken off the Hana table and taken to recovery room in stable condition.  All final counts were correct. There were no complications noted.  Of note, Erskine Emery, P.A. assisted during the entire case and his assistance was crucial for facilitating this case at all levels.     Lind Guest. Ninfa Linden, M.D.     CYB/MEDQ  D:  12/21/2013  T:  12/21/2013  Job:  [id number]

## 2013-12-21 NOTE — Plan of Care (Signed)
Problem: Consults Goal: Diagnosis- Total Joint Replacement Left anterior hip     

## 2013-12-21 NOTE — Transfer of Care (Signed)
Immediate Anesthesia Transfer of Care Note  Patient: Eric Delgado  Procedure(s) Performed: Procedure(s): LEFT TOTAL HIP ARTHROPLASTY ANTERIOR APPROACH (Left)  Patient Location: PACU  Anesthesia Type:Regional and Spinal  Level of Consciousness: awake, alert , oriented and patient cooperative  Airway & Oxygen Therapy: Patient Spontanous Breathing and Patient connected to nasal cannula oxygen  Post-op Assessment: Report given to PACU RN and Post -op Vital signs reviewed and stable  Post vital signs: Reviewed and stable  Complications: No apparent anesthesia complications

## 2013-12-21 NOTE — Brief Op Note (Signed)
12/21/2013  8:53 AM  PATIENT:  Eric Delgado  58 y.o. male  PRE-OPERATIVE DIAGNOSIS:  Severe primary osteoarthritis left hip  POST-OPERATIVE DIAGNOSIS:  Severe primary osteoarthritis left hip  PROCEDURE:  Procedure(s): LEFT TOTAL HIP ARTHROPLASTY ANTERIOR APPROACH (Left)  SURGEON:  Surgeon(s) and Role:    * Mcarthur Rossetti, MD - Primary  PHYSICIAN ASSISTANT: Benita Stabile, PA-C  ANESTHESIA:   spinal  EBL:  Total I/O In: 2000 [I.V.:2000] Out: 150 [Blood:150]  BLOOD ADMINISTERED:none  DRAINS: none   LOCAL MEDICATIONS USED:  NONE  SPECIMEN:  No Specimen  DISPOSITION OF SPECIMEN:  N/A  COUNTS:  YES  TOURNIQUET:  * No tourniquets in log *  DICTATION: .Other Dictation: Dictation Number [id number]  PLAN OF CARE: Admit to inpatient   PATIENT DISPOSITION:  PACU - hemodynamically stable.   Delay start of Pharmacological VTE agent (>24hrs) due to surgical blood loss or risk of bleeding: no

## 2013-12-22 LAB — BASIC METABOLIC PANEL
ANION GAP: 9 (ref 5–15)
BUN: 14 mg/dL (ref 6–23)
CALCIUM: 9 mg/dL (ref 8.4–10.5)
CHLORIDE: 102 meq/L (ref 96–112)
CO2: 27 meq/L (ref 19–32)
CREATININE: 0.88 mg/dL (ref 0.50–1.35)
GFR calc Af Amer: >90 mL/min (ref >90)
GFR calc non Af Amer: >90 mL/min (ref >90)
GLUCOSE: 121 mg/dL — AB (ref 70–99)
Potassium: 4.7 mEq/L (ref 3.7–5.3)
Sodium: 138 mEq/L (ref 137–147)

## 2013-12-22 LAB — CBC
HCT: 32.7 % — ABNORMAL LOW (ref 39.0–52.0)
Hemoglobin: 11.5 g/dL — ABNORMAL LOW (ref 13.0–17.0)
MCH: 30.3 pg (ref 26.0–34.0)
MCHC: 35.2 g/dL (ref 30.0–36.0)
MCV: 86.1 fL (ref 78.0–100.0)
Platelets: 253 10*3/uL (ref 150–400)
RBC: 3.8 MIL/uL — AB (ref 4.22–5.81)
RDW: 13 % (ref 11.5–15.5)
WBC: 12.5 10*3/uL — AB (ref 4.0–10.5)

## 2013-12-22 MED ORDER — METHOCARBAMOL 500 MG PO TABS: 500.0000 mg | ORAL_TABLET | Freq: Four times a day (QID) | ORAL | Status: DC | PRN

## 2013-12-22 MED ORDER — OXYCODONE-ACETAMINOPHEN 5-325 MG PO TABS: 1.0000 | ORAL_TABLET | ORAL | Status: DC | PRN

## 2013-12-22 MED ORDER — ASPIRIN 325 MG PO TBEC: 325.0000 mg | DELAYED_RELEASE_TABLET | Freq: Two times a day (BID) | ORAL | Status: DC

## 2013-12-22 NOTE — Progress Notes (Signed)
CARE MANAGEMENT NOTE 12/22/2013  Patient:  Eric Delgado, Eric Delgado   Account Number:  [id number]  Date Initiated:  12/21/2013  Documentation initiated by:  DAVIS,RHONDA  Subjective/Objective Assessment:   total hip ant approach left     Action/Plan:   home when stable   Anticipated DC Date:  12/24/2013   Anticipated DC Plan:  [place] referral  NA      DC Planning Services  CM consult      [facility] Choice  NA   Choice offered to / List presented to:  C-1 Patient        [place] arranged  HH-2 PT      [place]   Status of service:  Completed, signed off Medicare Important Message given?   (If response is "NO", the following Medicare IM given date fields will be blank) Date Medicare IM given:   Medicare IM given by:   Date Additional Medicare IM given:   Additional Medicare IM given by:    Discharge Disposition:  [place]  Per UR Regulation:  Reviewed for med. necessity/level of care/duration of stay  If discussed at [place] of Stay Meetings, dates discussed:    Comments:  12/22/2013 1200 Spoke to pt and has DME at home. Agreeable to [facility] for [facility]. Jonnie Finner RN CCM Case Mgmt  [id number]/CHENID Rosana Hoes, RN, BSN, CCM Chart reviewed. Discharge needs and patient's stay to be reviewed and followed by case manager.

## 2013-12-22 NOTE — Progress Notes (Signed)
Patient ID: Eric Delgado, male   DOB: 1955/08/22, 58 y.o.   MRN: [id number] PATIENT ID: [name]        MRN:  [id number]          DOB/AGE: Nov 08, 1955 / 58 y.o.    Eric Fears, MD   Eric Borg, PA-C [address] [place], [place]  [address]                             [phone]   PROGRESS NOTE  Subjective:  negative for Chest Pain  negative for Shortness of Breath  negative for Nausea/Vomiting   negative for Calf Pain    Tolerating Diet: yes         Patient reports pain as mild.     Wants to go home today.  Objective: Vital signs in last 24 hours:   Patient Vitals for the past 24 hrs:  BP Temp Temp src Pulse Resp SpO2 Height Weight  12/22/13 0800 - - - - 12 95 % - -  12/22/13 0615 (!) 103/45 mmHg 97.2 F (36.2 C) Oral 73 12 95 % - -  12/22/13 0315 113/70 mmHg 97.8 F (36.6 C) Oral 64 12 97 % - -  12/21/13 2205 113/70 mmHg 97.8 F (36.6 C) Oral 70 16 96 % - -  12/21/13 1350 119/76 mmHg 98 F (36.7 C) Oral 74 16 98 % - -  12/21/13 1250 128/83 mmHg 98.5 F (36.9 C) Oral 84 16 97 % - -  12/21/13 1150 124/82 mmHg 97.5 F (36.4 C) Axillary 78 16 97 % - -  12/21/13 1047 115/74 mmHg 97.4 F (36.3 C) - - 15 100 % - -  12/21/13 1045 115/75 mmHg 97.5 F (36.4 C) Axillary 68 16 97 % 5\' 10"  (1.778 m) 96.163 kg (212 lb)  12/21/13 1030 117/83 mmHg 97.5 F (36.4 C) - 65 17 100 % - -  12/21/13 1015 112/70 mmHg - - 74 17 100 % - -  12/21/13 1000 112/72 mmHg 97.4 F (36.3 C) - 67 16 100 % - -      Intake/Output from previous day:   11/06 0701 - 11/07 0700 In: 6600 [P.O.:2040; I.V.:4400] Out: 3150 [Urine:3000]   Intake/Output this shift:       Intake/Output      11/06 0701 - 11/07 0700 11/07 0701 - 11/08 0700   P.O. 2040    I.V. (mL/kg) 4400 (45.8)    IV Piggyback 160    Total Intake(mL/kg) 6600 (68.6)    Urine (mL/kg/hr) 3000 (1.3)    Blood 150 (0.1)    Total Output 3150     Net +3450             LABORATORY DATA:  Recent Labs   12/22/13 0544  WBC 12.5*  HGB 11.5*  HCT 32.7*  PLT 253    Recent Labs  12/22/13 0544  NA 138  K 4.7  CL 102  CO2 27  BUN 14  CREATININE 0.88  GLUCOSE 121*  CALCIUM 9.0   Lab Results  Component Value Date   INR 0.94 12/14/2013   INR 0.95 05/12/2010    Recent Radiographic Studies :  Dg Chest 2 View  12/14/2013   CLINICAL DATA:  Preoperative left hip arthroplasty. History of hypertension  EXAM: CHEST  2 VIEW  COMPARISON:  May 12, 2010  FINDINGS: There is no edema or consolidation. Heart size and pulmonary vascularity  are normal. No adenopathy. There is evidence of old trauma in the left shoulder region, healed.  IMPRESSION: No edema or consolidation.   Electronically Signed   By: Lowella Grip M.D.   On: 12/14/2013 09:29   Dg Hip Complete Left  12/21/2013   CLINICAL DATA:  Postop left total hip arthroplasty.  EXAM: OPERATIVE LEFT HIP - 1 VIEW  COMPARISON:  No recent left hip x-rays. AP pelvis 05/19/2010 at the time of right total hip arthroplasty.  FINDINGS: AP image of the left hip and AP image of the lower pelvis were obtained with the C-arm fluoroscopic device and submitted for interpretation post operatively. Left total hip arthroplasty with anatomic alignment. No visible complicating features.  IMPRESSION: Anatomic alignment of the left total hip arthroplasty in the AP projection without visible complicating features.   Electronically Signed   By: Evangeline Dakin M.D.   On: 12/21/2013 08:55   Dg Pelvis Portable  12/21/2013   CLINICAL DATA:  58 year old male status post left hip replacement. Initial encounter.  EXAM: PORTABLE PELVIS 1-2 VIEWS  COMPARISON:  Intraoperative radiographs 0939 hr the same day. Pelvis radiograph 05/19/2010.  FINDINGS: Portable AP supine view at 0937 hr. Preexisting right total hip arthroplasty. New left total hip arthroplasty postoperative changes to the overlying soft tissues. Hardware components appear intact and normally aligned. No unexpected  osseous changes about the pelvis.  IMPRESSION: New left total hip arthroplasty with no adverse features.  These results will be called to the ordering clinician or representative by the Radiology Department at the imaging location.   Electronically Signed   By: Lars Pinks M.D.   On: 12/21/2013 10:08   Dg Hip Portable 1 View Left  12/21/2013   CLINICAL DATA:  Status post left total hip replacement. History of osteoarthritis.  EXAM: PORTABLE LEFT HIP - 1 VIEW  COMPARISON:  AP pelvis 12/21/2013  FINDINGS: The patient has had bilateral total hip arthroplasty. The femoral heads are well seated in the acetabular components area  IMPRESSION: No evidence for dislocation of either total hip arthroplasty.   Electronically Signed   By: Shon Hale M.D.   On: 12/21/2013 10:07   Dg C-arm 61-120 Min-no Report  12/21/2013   CLINICAL DATA: surgery   C-ARM 61-120 MINUTES  Fluoroscopy was utilized by the requesting physician.  No radiographic  interpretation.      Examination:  General appearance: alert, cooperative and mild distress Resp: clear to auscultation bilaterally Cardio: regular rate and rhythm GI: normal findings: bowel sounds normal  Wound Exam: clean, dry, intact dressing  Drainage:  None: wound tissue dry  Motor Exam: EHL, FHL, Anterior Tibial and Posterior Tibial Intact  Sensory Exam: Superficial Peroneal, Deep Peroneal and Tibial normal  Vascular Exam: Left dorsalis pedis artery has 1+ (weak) pulse  Assessment:    1 Day Post-Op  Procedure(s) (LRB): LEFT TOTAL HIP ARTHROPLASTY ANTERIOR APPROACH (Left)  ADDITIONAL DIAGNOSIS:  Principal Problem:   Arthritis of left hip Active Problems:   Status post total replacement of left hip     Plan: Physical Therapy as ordered Weight Bearing as Tolerated (WBAT)  DVT Prophylaxis:  Aspirin, Foot Pumps and TED hose  DISCHARGE PLAN: Home  DISCHARGE NEEDS: HHPT, Walker and 3-in-1 comode seat        Eric Delgado  12/22/2013 9:48  AM

## 2013-12-22 NOTE — Progress Notes (Signed)
Occupational Therapy Evaluation Patient Details Name: Eric Delgado MRN: [id number] DOB: February 13, 1956 Today's Date: 12/22/2013    History of Present Illness L THR 12/21/13; R THR 2012   Clinical Impression   All OT education complete, no further OT needs.    Follow Up Recommendations  No OT follow up;Supervision - Intermittent    Equipment Recommendations  None recommended by OT    Recommendations for Other Services       Precautions / Restrictions Precautions Precautions: Fall Restrictions Weight Bearing Restrictions: No Other Position/Activity Restrictions: WBAT      Mobility Bed Mobility                  Transfers                      Balance                                            ADL Overall ADL's : Needs assistance/impaired Eating/Feeding: Independent   Grooming: Independent   Upper Body Bathing: Set up   Lower Body Bathing: Minimal assistance   Upper Body Dressing : Set up   Lower Body Dressing: Minimal assistance   Toilet Transfer: Supervision/safety   Toileting- Clothing Manipulation and Hygiene: Supervision/safety       Functional mobility during ADLs: Supervision/safety;Rolling walker General ADL Comments: Patient reports he does not need a review with AE and will have significant other assist with bathing/dressing as needed. Verbal education of tub bench transfer, patient verbalizes understanding, stating he remembers how to do it from previous hip replacement. Patient has all needed equipment at home. No further OT needs.     Vision                     Perception     Praxis      Pertinent Vitals/Pain Pain Assessment: No/denies pain     Hand Dominance Right   Extremity/Trunk Assessment Upper Extremity Assessment Upper Extremity Assessment: Overall WFL for tasks assessed   Lower Extremity Assessment Lower Extremity Assessment: Defer to PT evaluation   Cervical / Trunk  Assessment Cervical / Trunk Assessment: Normal   Communication Communication Communication: No difficulties   Cognition Arousal/Alertness: Awake/alert Behavior During Therapy: WFL for tasks assessed/performed Overall Cognitive Status: Within Functional Limits for tasks assessed                     General Comments       Exercises       Shoulder Instructions      Home Living Family/patient expects to be discharged to:: Private residence Living Arrangements: Spouse/significant other Available Help at Discharge: Family Type of Home: House Home Access: Stairs to enter Technical brewer of Steps: 3 Entrance Stairs-Rails: Left;Right Home Layout: One level     Bathroom Shower/Tub: Tub/shower unit Shower/tub characteristics: Architectural technologist: Standard Bathroom Accessibility: Yes How Accessible: Accessible via walker Home Equipment: Environmental consultant - 2 wheels;Cane - single point;Crutches;Bedside commode;Shower seat;Tub bench;Walker - 4 wheels   Additional Comments: Patient also has AE for LB bathing/dressing      Prior Functioning/Environment Level of Independence: Independent             OT Diagnosis: Generalized weakness   OT Problem List: Decreased range of motion;Decreased strength   OT Treatment/Interventions:      OT Goals(Current goals  can be found in the care plan section)    OT Frequency:     Barriers to D/C:            Co-evaluation              End of Session Equipment Utilized During Treatment: Rolling walker Nurse Communication: Mobility status  Activity Tolerance: Patient tolerated treatment well Patient left: in bed;with call bell/phone within reach;with family/visitor present   Time: 1006-1036 OT Time Calculation (min): 30 min Charges:  OT General Charges $OT Visit: 1 Procedure OT Evaluation $Initial OT Evaluation Tier I: 1 Procedure OT Treatments $Self Care/Home Management : 23-37 mins G-Codes:    Shiah Berhow  A 02-Jan-2014, 11:30 AM

## 2013-12-22 NOTE — Progress Notes (Signed)
Physical Therapy Treatment Patient Details Name: Eric Delgado MRN: [id number] DOB: 1955/03/02 Today's Date: 12/22/2013    History of Present Illness L THR 12/21/13; R THR 2012    PT Comments    Pt progressing well with mobility.  Reviewed stairs, home therex and car transfers with pt and spouse.  Follow Up Recommendations  Home health PT (Pt now states he thinks he might like some HHPT)     Equipment Recommendations  None recommended by PT    Recommendations for Other Services OT consult     Precautions / Restrictions Precautions Precautions: Fall Restrictions Weight Bearing Restrictions: No Other Position/Activity Restrictions: WBAT    Mobility  Bed Mobility Overal bed mobility: Needs Assistance Bed Mobility: Supine to Sit;Sit to Supine     Supine to sit: Supervision Sit to supine: Supervision   General bed mobility comments: cues for sequence and use of R LE to self assist  Transfers Overall transfer level: Needs assistance Equipment used: Rolling walker (2 wheeled) Transfers: Sit to/from Stand Sit to Stand: Min guard;Supervision         General transfer comment: cues for LE management and use of UEs to self assist  Ambulation/Gait Ambulation/Gait assistance: Min guard;Supervision Ambulation Distance (Feet): 150 Feet Assistive device: Rolling walker (2 wheeled) Gait Pattern/deviations: Step-to pattern;Decreased step length - right;Decreased step length - left;Shuffle     General Gait Details: cues for sequence, posture and position from RW   Stairs Stairs: Yes Stairs assistance: Min guard Stair Management: One rail Right;Step to pattern;Forwards;With crutches Number of Stairs: 5 General stair comments: cues for sequence and foot/crutch placement  Wheelchair Mobility    Modified Rankin (Stroke Patients Only)       Balance                                    Cognition Arousal/Alertness: Awake/alert Behavior During  Therapy: WFL for tasks assessed/performed Overall Cognitive Status: Within Functional Limits for tasks assessed                      Exercises Total Joint Exercises Ankle Circles/Pumps: AROM;Both;15 reps;Supine Quad Sets: AROM;Both;10 reps;Supine Gluteal Sets: AROM;Both;10 reps;Supine Heel Slides: AAROM;Left;Supine;20 reps Hip ABduction/ADduction: AAROM;Left;Supine;20 reps Long Arc Quad: AROM;10 reps;Seated;Left    General Comments        Pertinent Vitals/Pain Pain Assessment: 0-10 Pain Score: 5  Pain Location: L hip/thigh Pain Descriptors / Indicators: Tightness Pain Intervention(s): Limited activity within patient's tolerance;Monitored during session;Premedicated before session;Ice applied    Home Living Family/patient expects to be discharged to:: Private residence Living Arrangements: Spouse/significant other Available Help at Discharge: Family Type of Home: House Home Access: Stairs to enter Entrance Stairs-Rails: Left;Right Home Layout: One level Home Equipment: Environmental consultant - 2 wheels;Cane - single point;Crutches;Bedside commode;Shower seat;Tub bench;Walker - 4 wheels Additional Comments: Patient also has AE for LB bathing/dressing    Prior Function Level of Independence: Independent          PT Goals (current goals can now be found in the care plan section) Acute Rehab PT Goals Patient Stated Goal: Resume previous lifestyle with decreased pain PT Goal Formulation: With patient Time For Goal Achievement: 12/26/13 Potential to Achieve Goals: Good Progress towards PT goals: Progressing toward goals    Frequency  7X/week    PT Plan Current plan remains appropriate    Co-evaluation  End of Session   Activity Tolerance: Patient tolerated treatment well Patient left: in bed;with call bell/phone within reach;with family/visitor present     Time: 1610-9604 PT Time Calculation (min): 42 min  Charges:  $Gait Training: 8-22  mins $Therapeutic Exercise: 8-22 mins $Therapeutic Activity: 8-22 mins                    G Codes:      Titus Drone Dec 29, 2013, 12:38 PM

## 2013-12-22 NOTE — Progress Notes (Signed)
Discharged from floor via w/c, wife with pt. No changes in assessment. Eric Delgado  

## 2013-12-22 NOTE — Discharge Summary (Signed)
Patient ID: Eric Delgado MRN: [id number] DOB/AGE: 1955/12/21 58 y.o.  Admit date: 12/21/2013 Discharge date: 12/22/2013  Admission Diagnoses:  Principal Problem:   Arthritis of left hip Active Problems:   Status post total replacement of left hip   Discharge Diagnoses:  Same  Past Medical History  Diagnosis Date  . Hypertension   . Hyperlipidemia   . Arthritis   . Difficulty sleeping     takes xanax for sleep    Surgeries: Procedure(s): LEFT TOTAL HIP ARTHROPLASTY ANTERIOR APPROACH on 12/21/2013   Consultants:    Discharged Condition: Improved  Hospital Course: Eric Delgado is an 58 y.o. male who was admitted 12/21/2013 for operative treatment ofArthritis of left hip. Patient has severe unremitting pain that affects sleep, daily activities, and work/hobbies. After pre-op clearance the patient was taken to the operating room on 12/21/2013 and underwent  Procedure(s): LEFT TOTAL HIP ARTHROPLASTY ANTERIOR APPROACH.    Patient was given perioperative antibiotics: Anti-infectives    Start     Dose/Rate Route Frequency Ordered Stop   12/21/13 1400  ceFAZolin (ANCEF) IVPB 1 g/50 mL premix     1 g100 mL/hr over 30 Minutes Intravenous Every 6 hours 12/21/13 1053 12/21/13 1947   12/21/13 0558  ceFAZolin (ANCEF) IVPB 2 g/50 mL premix     2 g100 mL/hr over 30 Minutes Intravenous On call to O.R. 12/21/13 2355 12/21/13 0741       Patient was given sequential compression devices, early ambulation, and chemoprophylaxis to prevent DVT.  Patient benefited maximally from hospital stay and there were no complications.    Recent vital signs: Patient Vitals for the past 24 hrs:  BP Temp Temp src Pulse Resp SpO2  12/22/13 0945 119/77 mmHg 98.5 F (36.9 C) Oral 80 14 98 %  12/22/13 0800 - - - - 12 95 %  12/22/13 0615 (!) 103/45 mmHg 97.2 F (36.2 C) Oral 73 12 95 %  12/22/13 0315 113/70 mmHg 97.8 F (36.6 C) Oral 64 12 97 %  12/21/13 2205 113/70 mmHg 97.8 F (36.6 C) Oral 70  16 96 %  12/21/13 1350 119/76 mmHg 98 F (36.7 C) Oral 74 16 98 %  12/21/13 1250 128/83 mmHg 98.5 F (36.9 C) Oral 84 16 97 %     Recent laboratory studies:  Recent Labs  12/22/13 0544  WBC 12.5*  HGB 11.5*  HCT 32.7*  PLT 253  NA 138  K 4.7  CL 102  CO2 27  BUN 14  CREATININE 0.88  GLUCOSE 121*  CALCIUM 9.0     Discharge Medications:     Medication List    STOP taking these medications        HYDROcodone-acetaminophen 5-325 MG per tablet  Commonly known as:  NORCO/VICODIN     ibuprofen 800 MG tablet  Commonly known as:  ADVIL,MOTRIN      TAKE these medications        ALEVE 220 MG Caps  Generic drug:  Naproxen Sodium  Take 440 mg by mouth daily as needed (pain.).     ALPRAZolam 1 MG tablet  Commonly known as:  XANAX  Take 1 tablet (1 mg total) by mouth at bedtime as needed for sleep.     aspirin 325 MG EC tablet  Take 1 tablet (325 mg total) by mouth 2 (two) times daily after a meal.     CO Q 10 PO  Take 1 tablet by mouth every morning.     lisinopril-hydrochlorothiazide 20-25 MG  per tablet  Commonly known as:  PRINZIDE,ZESTORETIC  Take 1 tablet by mouth every morning.     methocarbamol 500 MG tablet  Commonly known as:  ROBAXIN  Take 1 tablet (500 mg total) by mouth every 6 (six) hours as needed for muscle spasms.     multivitamin with minerals Tabs tablet  Take 1 tablet by mouth every morning.     oxyCODONE-acetaminophen 5-325 MG per tablet  Commonly known as:  ROXICET  Take 1-2 tablets by mouth every 4 (four) hours as needed.     rosuvastatin 40 MG tablet  Commonly known as:  CRESTOR  Take 20 mg by mouth at bedtime.        Diagnostic Studies: Dg Chest 2 View  12/14/2013   CLINICAL DATA:  Preoperative left hip arthroplasty. History of hypertension  EXAM: CHEST  2 VIEW  COMPARISON:  May 12, 2010  FINDINGS: There is no edema or consolidation. Heart size and pulmonary vascularity are normal. No adenopathy. There is evidence of old trauma  in the left shoulder region, healed.  IMPRESSION: No edema or consolidation.   Electronically Signed   By: Lowella Grip M.D.   On: 12/14/2013 09:29   Dg Hip Complete Left  12/21/2013   CLINICAL DATA:  Postop left total hip arthroplasty.  EXAM: OPERATIVE LEFT HIP - 1 VIEW  COMPARISON:  No recent left hip x-rays. AP pelvis 05/19/2010 at the time of right total hip arthroplasty.  FINDINGS: AP image of the left hip and AP image of the lower pelvis were obtained with the C-arm fluoroscopic device and submitted for interpretation post operatively. Left total hip arthroplasty with anatomic alignment. No visible complicating features.  IMPRESSION: Anatomic alignment of the left total hip arthroplasty in the AP projection without visible complicating features.   Electronically Signed   By: Evangeline Dakin M.D.   On: 12/21/2013 08:55   Dg Pelvis Portable  12/21/2013   CLINICAL DATA:  58 year old male status post left hip replacement. Initial encounter.  EXAM: PORTABLE PELVIS 1-2 VIEWS  COMPARISON:  Intraoperative radiographs 0939 hr the same day. Pelvis radiograph 05/19/2010.  FINDINGS: Portable AP supine view at 0937 hr. Preexisting right total hip arthroplasty. New left total hip arthroplasty postoperative changes to the overlying soft tissues. Hardware components appear intact and normally aligned. No unexpected osseous changes about the pelvis.  IMPRESSION: New left total hip arthroplasty with no adverse features.  These results will be called to the ordering clinician or representative by the Radiology Department at the imaging location.   Electronically Signed   By: Lars Pinks M.D.   On: 12/21/2013 10:08   Dg Hip Portable 1 View Left  12/21/2013   CLINICAL DATA:  Status post left total hip replacement. History of osteoarthritis.  EXAM: PORTABLE LEFT HIP - 1 VIEW  COMPARISON:  AP pelvis 12/21/2013  FINDINGS: The patient has had bilateral total hip arthroplasty. The femoral heads are well seated in the  acetabular components area  IMPRESSION: No evidence for dislocation of either total hip arthroplasty.   Electronically Signed   By: Shon Hale M.D.   On: 12/21/2013 10:07   Dg C-arm 61-120 Min-no Report  12/21/2013   CLINICAL DATA: surgery   C-ARM 61-120 MINUTES  Fluoroscopy was utilized by the requesting physician.  No radiographic  interpretation.     Disposition:   To home      Discharge Instructions    Call MD / Call 911    Complete by:  As  directed   If you experience chest pain or shortness of breath, CALL 911 and be transported to the hospital emergency room.  If you develope a fever above 101 F, pus (white drainage) or increased drainage or redness at the wound, or calf pain, call your surgeon's office.     Constipation Prevention    Complete by:  As directed   Drink plenty of fluids.  Prune juice may be helpful.  You may use a stool softener, such as Colace (over the counter) 100 mg twice a day.  Use MiraLax (over the counter) for constipation as needed.     Diet - low sodium heart healthy    Complete by:  As directed      Discharge instructions    Complete by:  As directed   Increase your activities as comfort allows. Expect left thigh, leg, and foot swelling.  Ice and elevation as needed. Do get an over-the-counter stool softener and take 1-2 times daily as needed. You can get your actual dressing wet in the shower daily. I you want, you can remove your dressing in one week and start getting your actual incision wet daily.     Discharge patient    Complete by:  As directed      Increase activity slowly as tolerated    Complete by:  As directed            Follow-up Information    Follow up with [facility]   Why:  Home Health Physical Therapy   Contact information:   [address] [place] [address] [phone]       Follow up with Mcarthur Rossetti, MD In 2 weeks.   Specialty:  Orthopedic Surgery   Contact information:   [place] [address] [phone]        Signed: Mcarthur Rossetti 12/22/2013, 12:19 PM

## 2013-12-22 NOTE — Plan of Care (Signed)
Problem: Discharge Progression Outcomes Goal: Barriers To Progression Addressed/Resolved Outcome: Not Applicable Date Met:  02/54/86 Goal: CMS/Neurovascular status at or above baseline Outcome: Completed/Met Date Met:  12/22/13 Goal: Anticoagulant follow-up in place Outcome: Not Applicable Date Met:  28/24/17 asa Goal: Pain controlled with appropriate interventions Outcome: Completed/Met Date Met:  12/22/13 Goal: Hemodynamically stable Outcome: Completed/Met Date Met:  53/01/04 Goal: Complications resolved/controlled Outcome: Not Applicable Date Met:  04/59/13 Goal: Tolerates diet Outcome: Completed/Met Date Met:  12/22/13 Goal: Activity appropriate for discharge plan Outcome: Completed/Met Date Met:  12/22/13 Goal: Ambulates safely using assistive device Outcome: Completed/Met Date Met:  12/22/13 Goal: Follows weight - bearing limitations Outcome: Completed/Met Date Met:  12/22/13 Goal: Discharge plan in place and appropriate Outcome: Completed/Met Date Met:  12/22/13 Goal: Negotiates stairs Outcome: Completed/Met Date Met:  12/22/13 Goal: Demonstrates ADLs as appropriate Outcome: Completed/Met Date Met:  12/22/13 Goal: Incision without S/S infection Outcome: Completed/Met Date Met:  12/22/13 Goal: Other Discharge Outcomes/Goals Outcome: Not Applicable Date Met:  68/59/92

## 2013-12-22 NOTE — Discharge Instructions (Signed)
Total Hip Replacement, Care After °Refer to this sheet in the next few weeks. These instructions provide you with information on caring for yourself after your procedure. Your health care provider may also give you specific instructions. Your treatment has been planned according to the most current medical practices, but problems sometimes occur. Call your health care provider if you have any problems or questions after your procedure. °HOME CARE INSTRUCTIONS  °Your health care provider will give you specific precautions for certain types of movement. Additional instructions include: °· Take medicines only as directed by your health care provider. °· Take quick showers (3-5 min) rather than bathe until your health care provider tells you that you can take baths again. °· Avoid lifting until your health care provider instructs you otherwise. °· Use a raised toilet seat and avoid sitting in low chairs as instructed by your health care provider. °· Use crutches or a walker as instructed by your health care provider. °SEEK MEDICAL CARE IF: °· You have difficulty breathing. °· You have drainage, redness, or swelling at your incision site. °· You have a bad smell coming from your incision site. °· You have persistent bleeding from your incision site. °· Your incision breaks open after sutures (stitches) or staples have been removed. °· You have a fever. °SEEK IMMEDIATE MEDICAL CARE IF:  °· You have a rash. °· You have pain or swelling in your calf or thigh. °· You have shortness of breath or chest pain. °MAKE SURE YOU: °· Understand these instructions. °· Will watch your condition. °· Will get help if you are not doing well or get worse. °Document Released: 08/21/2004 Document Revised: 06/18/2013 Document Reviewed: 04/04/2013 °ExitCare® Patient Information ©2015 [employer]. This information is not intended to replace advice given to you by your health care provider. Make sure you discuss any questions you have with  your health care provider. ° °

## 2013-12-24 NOTE — Progress Notes (Signed)
Utilization review completed.  

## 2014-02-08 ENCOUNTER — Other Ambulatory Visit: Payer: Self-pay | Admitting: Family Medicine

## 2014-02-10 NOTE — Telephone Encounter (Signed)
plz phone in. 

## 2014-02-11 NOTE — Telephone Encounter (Signed)
Rx called in as directed.   

## 2014-03-01 ENCOUNTER — Ambulatory Visit (INDEPENDENT_AMBULATORY_CARE_PROVIDER_SITE_OTHER): Payer: 59 | Admitting: Family Medicine

## 2014-03-01 ENCOUNTER — Encounter: Payer: Self-pay | Admitting: Family Medicine

## 2014-03-01 VITALS — BP 126/78 | HR 84 | Temp 98.1°F | Wt 216.5 lb

## 2014-03-01 DIAGNOSIS — K1370 Unspecified lesions of oral mucosa: Secondary | ICD-10-CM | POA: Insufficient documentation

## 2014-03-01 DIAGNOSIS — E785 Hyperlipidemia, unspecified: Secondary | ICD-10-CM

## 2014-03-01 DIAGNOSIS — K1379 Other lesions of oral mucosa: Secondary | ICD-10-CM

## 2014-03-01 DIAGNOSIS — I10 Essential (primary) hypertension: Secondary | ICD-10-CM

## 2014-03-01 MED ORDER — ALPRAZOLAM 1 MG PO TABS: ORAL_TABLET | ORAL | Status: DC

## 2014-03-01 MED ORDER — ROSUVASTATIN CALCIUM 40 MG PO TABS: 20.0000 mg | ORAL_TABLET | Freq: Every day | ORAL | Status: DC

## 2014-03-01 MED ORDER — LISINOPRIL-HYDROCHLOROTHIAZIDE 20-25 MG PO TABS: 1.0000 | ORAL_TABLET | ORAL | Status: DC

## 2014-03-01 NOTE — Progress Notes (Signed)
BP 126/78 mmHg  Pulse 84  Temp(Src) 98.1 F (36.7 C) (Oral)  Wt 216 lb 8 oz (98.204 kg)   CC: check throat spot  Subjective:    Patient ID: Eric Delgado, male    DOB: [birth year], 59 y.o.   MRN: [id number]  HPI: [name] is a 59 y.o. 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 Not tender. + rhinorrhea for 1 week. No congestion, fevers/chills, coughing, ST.   Quit smoking 01/2010. No smokeless tobacco. Occasional alcohol  New - had teeth cleaned 6 mo ago and this was not noticed then.   Recent THA 12/2013.  Mother with lung cancer - smoker. Passed away last year.   Also requests meds refilled today.  Relevant past medical, surgical, family and social history reviewed and updated as indicated. Interim medical history since our last visit reviewed. Allergies and medications reviewed and updated. Current Outpatient Prescriptions on File Prior to Visit  Medication Sig  . aspirin EC 325 MG EC tablet Take 1 tablet (325 mg total) by mouth 2 (two) times daily after a meal. (Patient taking differently: Take 162 mg by mouth daily. )  . Coenzyme Q10 (CO Q 10 PO) Take 1 tablet by mouth every morning.  . Multiple Vitamin (MULTIVITAMIN WITH MINERALS) TABS tablet Take 1 tablet by mouth every morning.   No current facility-administered medications on file prior to visit.    Review of Systems Per HPI unless specifically indicated above     Objective:    BP 126/78 mmHg  Pulse 84  Temp(Src) 98.1 F (36.7 C) (Oral)  Wt 216 lb 8 oz (98.204 kg)  Wt Readings from Last 3 Encounters:  03/01/14 216 lb 8 oz (98.204 kg)  12/21/13 212 lb (96.163 kg)  05/25/13 206 lb 12 oz (93.781 kg)    Physical Exam  Constitutional: He appears well-developed and well-nourished. No distress.  HENT:  Right Ear: Hearing, tympanic membrane, external ear and ear canal normal.  Left Ear: Hearing, tympanic membrane, external ear and  ear canal normal.  Nose: Nose normal. No mucosal edema or rhinorrhea.  Mouth/Throat: Uvula is midline, oropharynx is clear and moist and mucous membranes are normal. No oropharyngeal exudate, posterior oropharyngeal edema, posterior oropharyngeal erythema or tonsillar abscesses.    Bluish lesion R soft palate about 1cm diameter  Eyes: Conjunctivae and EOM are normal. Pupils are equal, round, and reactive to light. No scleral icterus.  Neck: Normal range of motion. Neck supple. No thyromegaly present.  Lymphadenopathy:    He has no cervical adenopathy.  Nursing note and vitals reviewed.      Assessment & Plan:   Problem List Items Addressed This Visit    HLD (hyperlipidemia)    Tolerating crestor 1/2 tab daily. Refilled today. FLP next fasting labs - pt will return in 3-4 months for CPE      Relevant Medications   rosuvastatin (CRESTOR) tablet   lisinopril-hydrochlorothiazide (PRINZIDE,ZESTORETIC) 20-25 MG per tablet   Essential hypertension    Never f/u previously. Better control on current regimen Refilled today.      Relevant Medications   rosuvastatin (CRESTOR) tablet   lisinopril-hydrochlorothiazide (PRINZIDE,ZESTORETIC) 20-25 MG per tablet   Bullous lesion of soft palate - Primary    New lesion, seems benign. ?mucocele. Pt worried and states dentist advised he have ENT eval. Will refer for 2nd opinion.      Relevant Orders   Ambulatory  referral to ENT       Follow up plan: Return in about 4 months (around 06/30/2014), or as needed, for annual exam, prior fasting for blood work.

## 2014-03-01 NOTE — Assessment & Plan Note (Signed)
New lesion, seems benign. ?mucocele. Pt worried and states dentist advised he have ENT eval. Will refer for 2nd opinion.

## 2014-03-01 NOTE — Assessment & Plan Note (Signed)
Never f/u previously. Better control on current regimen Refilled today.

## 2014-03-01 NOTE — Patient Instructions (Addendum)
Return in 4-5 months for physical. Pass by Marion's office for referral to ENT for their opinion.

## 2014-03-01 NOTE — Assessment & Plan Note (Signed)
Tolerating crestor 1/2 tab daily. Refilled today. FLP next fasting labs - pt will return in 3-4 months for CPE

## 2014-03-01 NOTE — Progress Notes (Signed)
Pre visit review using our clinic review tool, if applicable. No additional management support is needed unless otherwise documented below in the visit note. 

## 2014-11-27 ENCOUNTER — Other Ambulatory Visit: Payer: Self-pay | Admitting: Family Medicine

## 2014-12-29 ENCOUNTER — Other Ambulatory Visit: Payer: Self-pay | Admitting: Family Medicine

## 2014-12-30 NOTE — Telephone Encounter (Signed)
Ok to refill 

## 2014-12-30 NOTE — Telephone Encounter (Signed)
plz phone in. 

## 2014-12-31 NOTE — Telephone Encounter (Signed)
Rx called in as directed.   

## 2015-02-13 ENCOUNTER — Other Ambulatory Visit: Payer: Self-pay | Admitting: Family Medicine

## 2015-02-16 HISTORY — PX: CATARACT EXTRACTION: SUR2

## 2015-03-17 ENCOUNTER — Ambulatory Visit: Payer: Self-pay | Admitting: Family Medicine

## 2015-03-21 ENCOUNTER — Other Ambulatory Visit: Payer: Self-pay | Admitting: Family Medicine

## 2015-03-21 ENCOUNTER — Ambulatory Visit (INDEPENDENT_AMBULATORY_CARE_PROVIDER_SITE_OTHER): Payer: BLUE CROSS/BLUE SHIELD | Admitting: Family Medicine

## 2015-03-21 ENCOUNTER — Encounter: Payer: Self-pay | Admitting: Family Medicine

## 2015-03-21 ENCOUNTER — Encounter: Payer: Self-pay | Admitting: *Deleted

## 2015-03-21 VITALS — BP 130/80 | HR 80 | Temp 98.0°F | Wt 216.2 lb

## 2015-03-21 DIAGNOSIS — Z125 Encounter for screening for malignant neoplasm of prostate: Secondary | ICD-10-CM | POA: Diagnosis not present

## 2015-03-21 DIAGNOSIS — I10 Essential (primary) hypertension: Secondary | ICD-10-CM | POA: Diagnosis not present

## 2015-03-21 DIAGNOSIS — Z1159 Encounter for screening for other viral diseases: Secondary | ICD-10-CM

## 2015-03-21 DIAGNOSIS — Z23 Encounter for immunization: Secondary | ICD-10-CM

## 2015-03-21 DIAGNOSIS — E785 Hyperlipidemia, unspecified: Secondary | ICD-10-CM | POA: Diagnosis not present

## 2015-03-21 DIAGNOSIS — K1370 Unspecified lesions of oral mucosa: Secondary | ICD-10-CM

## 2015-03-21 DIAGNOSIS — F411 Generalized anxiety disorder: Secondary | ICD-10-CM | POA: Diagnosis not present

## 2015-03-21 LAB — COMPREHENSIVE METABOLIC PANEL
ALK PHOS: 44 U/L (ref 39–117)
ALT: 38 U/L (ref 0–53)
AST: 20 U/L (ref 0–37)
Albumin: 4.7 g/dL (ref 3.5–5.2)
BILIRUBIN TOTAL: 0.6 mg/dL (ref 0.2–1.2)
BUN: 24 mg/dL — AB (ref 6–23)
CO2: 25 mEq/L (ref 19–32)
CREATININE: 1.06 mg/dL (ref 0.40–1.50)
Calcium: 9.7 mg/dL (ref 8.4–10.5)
Chloride: 101 mEq/L (ref 96–112)
GFR: 76.15 mL/min (ref >60.00)
GLUCOSE: 107 mg/dL — AB (ref 70–99)
Potassium: 4.3 mEq/L (ref 3.5–5.1)
SODIUM: 135 meq/L (ref 135–145)
TOTAL PROTEIN: 7.5 g/dL (ref 6.0–8.3)

## 2015-03-21 LAB — LIPID PANEL
Cholesterol: 261 mg/dL — ABNORMAL HIGH (ref 0–200)
HDL: 60.2 mg/dL (ref >39.00)
NONHDL: 200.63
Total CHOL/HDL Ratio: 4
Triglycerides: 221 mg/dL — ABNORMAL HIGH (ref 0.0–149.0)
VLDL: 44.2 mg/dL — AB (ref 0.0–40.0)

## 2015-03-21 LAB — TSH: TSH: 2.72 u[IU]/mL (ref 0.35–4.50)

## 2015-03-21 LAB — PSA: PSA: 0.68 ng/mL (ref 0.10–4.00)

## 2015-03-21 LAB — LDL CHOLESTEROL, DIRECT: Direct LDL: 173 mg/dL

## 2015-03-21 MED ORDER — ALPRAZOLAM 1 MG PO TABS: 1.0000 mg | ORAL_TABLET | Freq: Every evening | ORAL | Status: DC | PRN

## 2015-03-21 MED ORDER — TRAZODONE HCL 50 MG PO TABS: 25.0000 mg | ORAL_TABLET | Freq: Every evening | ORAL | Status: DC | PRN

## 2015-03-21 MED ORDER — LISINOPRIL-HYDROCHLOROTHIAZIDE 20-25 MG PO TABS: 1.0000 | ORAL_TABLET | Freq: Every morning | ORAL | Status: DC

## 2015-03-21 MED ORDER — ROSUVASTATIN CALCIUM 40 MG PO TABS: 40.0000 mg | ORAL_TABLET | Freq: Every day | ORAL | Status: DC

## 2015-03-21 NOTE — Assessment & Plan Note (Signed)
Chronic, stable. Continue current regimen. 

## 2015-03-21 NOTE — Progress Notes (Signed)
BP 130/80 mmHg  Pulse 80  Temp(Src) 98 F (36.7 C) (Oral)  Wt 216 lb 4 oz (98.09 kg)   CC: med refill visit  Subjective:    Patient ID: Eric Delgado, male    DOB: [birth year], 60 y.o.   MRN: [id number]  HPI: Eric Delgado is a 60 y.o. male presenting on 03/21/2015 for Medication Refill   Xanax prn sleep (1/2 tab nightly). Did not tolerate prozac (prescribed by Dr Linda Hedges) for depression. Stays irritable. Last filled #30 with RF 1 (12/30/2014).  Flu shot today.  Recent cataract surgery.   HTN - Compliant with current antihypertensive regimen of lisinopril hctz 20/25 daily. Does not regularly check blood pressures at home. No low blood pressure readings or symptoms of dizziness/syncope. Denies HA, vision changes, CP/tightness, SOB, leg swelling.   HLD - prior on crestor 40mg  1/2 tab daily. Due for labs. Stopped crestor 04/2014. Instead started taking RYR once daily. Restarted crestor over last few weeks. Requests continued rosuvastatin 40mg  which has worked best for him.   Preventative: COLONOSCOPY Date: 10/2010 WNL, rpt 10 yrs (Dr. Glennon [facility]) Prostate cancer screening - discussed - requests PSA and declines DRE Td 2009 Flu shot - today Seat belt use discussed Sunscreen use discussed. No changing moles on skin.  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   Relevant past medical, surgical, family and social history reviewed and updated as indicated. Interim medical history since our last visit reviewed. Allergies and medications reviewed and updated. Current Outpatient Prescriptions on File Prior to Visit  Medication Sig  . Coenzyme Q10 (CO Q 10 PO) Take 1 tablet by mouth every morning.  . Multiple Vitamin (MULTIVITAMIN WITH MINERALS) TABS tablet Take 1 tablet  by mouth every morning.   No current facility-administered medications on file prior to visit.    Review of Systems  Constitutional: Negative for fever, chills, activity change, appetite change, fatigue and unexpected weight change.  HENT: Negative for hearing loss.   Eyes: Negative for visual disturbance.  Respiratory: Negative for cough, chest tightness, shortness of breath and wheezing.   Cardiovascular: Negative for chest pain, palpitations and leg swelling.  Gastrointestinal: Negative for nausea, vomiting, abdominal pain, diarrhea, constipation, blood in stool and abdominal distention.  Genitourinary: Negative for hematuria and difficulty urinating.  Musculoskeletal: Negative for myalgias, arthralgias and neck pain.  Skin: Negative for rash.  Neurological: Negative for dizziness, seizures, syncope and headaches.  Hematological: Negative for adenopathy. Does not bruise/bleed easily.  Psychiatric/Behavioral: Negative for dysphoric mood. The patient is not nervous/anxious.    Per HPI unless specifically indicated in ROS section     Objective:    BP 130/80 mmHg  Pulse 80  Temp(Src) 98 F (36.7 C) (Oral)  Wt 216 lb 4 oz (98.09 kg)  Wt Readings from Last 3 Encounters:  03/21/15 216 lb 4 oz (98.09 kg)  03/01/14 216 lb 8 oz (98.204 kg)  12/21/13 212 lb (96.163 kg)    Physical Exam  Constitutional: He is oriented to person, place, and time. He appears well-developed and well-nourished. No distress.  HENT:  Head: Normocephalic and atraumatic.  Right Ear: Hearing, tympanic membrane, external ear and ear canal normal.  Left Ear: Hearing, tympanic membrane, external ear and ear canal normal.  Nose: Nose normal.  Mouth/Throat: Uvula is midline, oropharynx is clear  and moist and mucous membranes are normal. No oropharyngeal exudate, posterior oropharyngeal edema or posterior oropharyngeal erythema.  Eyes: Conjunctivae and EOM are normal. Pupils are equal, round, and reactive to light.  No scleral icterus.  Neck: Normal range of motion. Neck supple. No thyromegaly present.  Cardiovascular: Normal rate, regular rhythm, normal heart sounds and intact distal pulses.   No murmur heard. Pulses:      Radial pulses are 2+ on the right side, and 2+ on the left side.  Pulmonary/Chest: Effort normal and breath sounds normal. No respiratory distress. He has no wheezes. He has no rales.  Abdominal: Soft. Bowel sounds are normal. He exhibits no distension and no mass. There is no tenderness. There is no rebound and no guarding.  Genitourinary:  Declines DRE  Musculoskeletal: Normal range of motion. He exhibits no edema.  Lymphadenopathy:    He has no cervical adenopathy.  Neurological: He is alert and oriented to person, place, and time.  CN grossly intact, station and gait intact  Skin: Skin is warm and dry. No rash noted.  Psychiatric: He has a normal mood and affect. His behavior is normal. Judgment and thought content normal.  Nursing note and vitals reviewed.      Assessment & Plan:   Problem List Items Addressed This Visit    Oral mucosal lesion    Did not see ENT. Deemed benign by dentist. ?mucocele vs vascular growth      HLD (hyperlipidemia) - Primary    Chronic, check FLP today.       Relevant Medications   aspirin 81 MG tablet   lisinopril-hydrochlorothiazide (PRINZIDE,ZESTORETIC) 20-25 MG tablet   rosuvastatin (CRESTOR) 40 MG tablet   Other Relevant Orders   Lipid panel   Comprehensive metabolic panel   Essential hypertension    Chronic, stable. Continue current regimen.      Relevant Medications   aspirin 81 MG tablet   lisinopril-hydrochlorothiazide (PRINZIDE,ZESTORETIC) 20-25 MG tablet   rosuvastatin (CRESTOR) 40 MG tablet   Other Relevant Orders   Comprehensive metabolic panel   TSH   Anxiety state    Per prior pcp. Did not tolerate prozac. Will start trazodone nightly for sleep and mood, in hopes of coming off xanax. If ineffective, switch to  zoloft (GF takes this)      Relevant Medications   traZODone (DESYREL) 50 MG tablet   ALPRAZolam (XANAX) 1 MG tablet    Other Visit Diagnoses    Need for hepatitis C screening test        Relevant Orders    Hepatitis C antibody, reflex    Special screening for malignant neoplasm of prostate        Relevant Orders    PSA        Follow up plan: Return in about 1 year (around 03/20/2016), or as needed, for annual exam, prior fasting for blood work.

## 2015-03-21 NOTE — Assessment & Plan Note (Signed)
Per prior pcp. Did not tolerate prozac. Will start trazodone nightly for sleep and mood, in hopes of coming off xanax. If ineffective, switch to zoloft (GF takes this)

## 2015-03-21 NOTE — Progress Notes (Signed)
Pre visit review using our clinic review tool, if applicable. No additional management support is needed unless otherwise documented below in the visit note. 

## 2015-03-21 NOTE — Addendum Note (Signed)
Addended by: Royann Shivers A on: 03/21/2015 09:05 AM   Modules accepted: Orders

## 2015-03-21 NOTE — Assessment & Plan Note (Signed)
Did not see ENT. Deemed benign by dentist. ?mucocele vs vascular growth

## 2015-03-21 NOTE — Patient Instructions (Addendum)
Flu shot today Urine drug screen today labwork today. Trial trazodone 25-50mg  nightly for sleep and mood. Let us know if not tolerated and we wlil trial zoloft.  I've refilled blood pressure medicine and cholesterol and xanax.  Return as needed or in 1 year for physical.

## 2015-03-21 NOTE — Assessment & Plan Note (Signed)
Chronic, check FLP today.

## 2015-03-22 LAB — HEPATITIS C ANTIBODY: HCV Ab: NEGATIVE

## 2015-04-07 ENCOUNTER — Encounter: Payer: Self-pay | Admitting: Family Medicine

## 2015-04-16 ENCOUNTER — Encounter: Payer: Self-pay | Admitting: Family Medicine

## 2015-04-17 NOTE — Telephone Encounter (Signed)
Please see Mcychart message

## 2015-04-18 MED ORDER — SERTRALINE HCL 25 MG PO TABS: 25.0000 mg | ORAL_TABLET | Freq: Every day | ORAL | Status: DC

## 2015-06-19 ENCOUNTER — Other Ambulatory Visit: Payer: Self-pay | Admitting: Family Medicine

## 2015-06-19 NOTE — Telephone Encounter (Signed)
plz phone in. 

## 2015-06-19 NOTE — Telephone Encounter (Signed)
Last filled 05-20-15 #30 Last OV 03-21-15 No future OV

## 2015-06-20 NOTE — Telephone Encounter (Signed)
Rx called in as directed.   

## 2015-07-19 ENCOUNTER — Other Ambulatory Visit: Payer: Self-pay | Admitting: Family Medicine

## 2015-07-21 NOTE — Telephone Encounter (Signed)
Ok to refill 

## 2015-07-21 NOTE — Telephone Encounter (Signed)
plz phone in xanax. 

## 2015-07-21 NOTE — Telephone Encounter (Signed)
Alprazolam called in to [place].

## 2015-08-25 ENCOUNTER — Other Ambulatory Visit: Payer: Self-pay | Admitting: *Deleted

## 2015-08-25 NOTE — Telephone Encounter (Signed)
#  30x0 last filled 07/21/15. Pt's last ov was for a med refill on 03/21/15. No future appt scheduled.

## 2015-08-26 MED ORDER — ALPRAZOLAM 1 MG PO TABS: ORAL_TABLET | ORAL | Status: DC

## 2015-08-26 NOTE — Telephone Encounter (Signed)
Left refill on voice mail at pharmacy  

## 2015-08-26 NOTE — Telephone Encounter (Signed)
plz phone in. 

## 2015-09-14 ENCOUNTER — Other Ambulatory Visit: Payer: Self-pay | Admitting: Family Medicine

## 2015-09-14 DIAGNOSIS — E785 Hyperlipidemia, unspecified: Secondary | ICD-10-CM

## 2015-09-15 ENCOUNTER — Other Ambulatory Visit (INDEPENDENT_AMBULATORY_CARE_PROVIDER_SITE_OTHER): Payer: BLUE CROSS/BLUE SHIELD

## 2015-09-15 DIAGNOSIS — E785 Hyperlipidemia, unspecified: Secondary | ICD-10-CM

## 2015-09-15 DIAGNOSIS — Z79899 Other long term (current) drug therapy: Secondary | ICD-10-CM | POA: Diagnosis not present

## 2015-09-15 LAB — LIPID PANEL
CHOLESTEROL: 221 mg/dL — AB (ref 0–200)
HDL: 63.6 mg/dL (ref >39.00)
LDL CALC: 120 mg/dL — AB (ref 0–99)
NonHDL: 157.28
TRIGLYCERIDES: 185 mg/dL — AB (ref 0.0–149.0)
Total CHOL/HDL Ratio: 3
VLDL: 37 mg/dL (ref 0.0–40.0)

## 2015-09-15 LAB — HEPATIC FUNCTION PANEL
ALBUMIN: 4.6 g/dL (ref 3.5–5.2)
ALT: 32 U/L (ref 0–53)
AST: 24 U/L (ref 0–37)
Alkaline Phosphatase: 35 U/L — ABNORMAL LOW (ref 39–117)
Bilirubin, Direct: 0 mg/dL (ref 0.0–0.3)
TOTAL PROTEIN: 7.7 g/dL (ref 6.0–8.3)
Total Bilirubin: 0.6 mg/dL (ref 0.2–1.2)

## 2015-09-18 ENCOUNTER — Encounter: Payer: Self-pay | Admitting: Family Medicine

## 2015-09-18 ENCOUNTER — Encounter: Payer: BLUE CROSS/BLUE SHIELD | Admitting: Family Medicine

## 2015-09-19 NOTE — Telephone Encounter (Signed)
released results via mychart. 

## 2015-09-25 ENCOUNTER — Other Ambulatory Visit: Payer: Self-pay | Admitting: *Deleted

## 2015-09-25 MED ORDER — ALPRAZOLAM 1 MG PO TABS: ORAL_TABLET | ORAL | 0 refills | Status: DC

## 2015-09-25 NOTE — Telephone Encounter (Signed)
Ok to refill? Last filled 08/26/15 #30 0RF. Last OV 03/21/15.

## 2015-09-25 NOTE — Telephone Encounter (Signed)
plz phone in. 

## 2015-09-26 NOTE — Telephone Encounter (Signed)
Rx called in as directed.   

## 2015-10-27 ENCOUNTER — Other Ambulatory Visit: Payer: Self-pay | Admitting: *Deleted

## 2015-10-27 MED ORDER — ALPRAZOLAM 1 MG PO TABS: ORAL_TABLET | ORAL | 0 refills | Status: DC

## 2015-10-27 NOTE — Telephone Encounter (Signed)
Ok to refill? Last filled 09/25/15 #30 0RF

## 2015-10-27 NOTE — Telephone Encounter (Signed)
plz phone in. 

## 2015-10-28 NOTE — Telephone Encounter (Signed)
Rx called in as directed.   

## 2015-12-11 ENCOUNTER — Other Ambulatory Visit: Payer: Self-pay | Admitting: *Deleted

## 2015-12-11 MED ORDER — ALPRAZOLAM 1 MG PO TABS
ORAL_TABLET | ORAL | 0 refills | Status: DC
Start: 2015-12-11 — End: 2016-02-06

## 2015-12-11 NOTE — Telephone Encounter (Signed)
Rx called in as directed.   

## 2015-12-11 NOTE — Telephone Encounter (Signed)
Ok to refill? Last filled 10/26/16 #30 0RF

## 2015-12-11 NOTE — Telephone Encounter (Signed)
plz phone in. 

## 2015-12-19 ENCOUNTER — Telehealth: Payer: Self-pay | Admitting: Family Medicine

## 2015-12-19 NOTE — Telephone Encounter (Signed)
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 Also he said he no longer take the Zoloft- has been off for years. Please call with any questions.

## 2015-12-19 NOTE — Telephone Encounter (Signed)
Spoke with patient as I was confused about message. He said he only took the zoloft one time and didn't like the way it made him feel, so he doesn't take it any longer. Removed from med list. He is still taking alprazolam at bedtime. Med list is current now. He would like to pick up letter 12/24/15 as he knows you're out of the office.

## 2015-12-22 NOTE — Telephone Encounter (Signed)
Letter written and in chart.  Keep trying to cut down on alprazolam - will review at next OV.

## 2015-12-23 NOTE — Telephone Encounter (Signed)
Printed letter. Patient notified and letter mailed as requested.

## 2016-02-06 ENCOUNTER — Other Ambulatory Visit: Payer: Self-pay | Admitting: Family Medicine

## 2016-02-06 NOTE — Telephone Encounter (Signed)
Last refill 12/11/15 #30, 0RF, last OV 03/21/15.

## 2016-02-06 NOTE — Telephone Encounter (Signed)
Last filled 06-10-14 #30 Last OV 03-21-15 No Future OV

## 2016-02-06 NOTE — Telephone Encounter (Signed)
plz phoen in. 

## 2016-02-06 NOTE — Telephone Encounter (Signed)
Called in to [facility] - [place], [place] - [address]: [phone]  

## 2016-03-18 IMAGING — DX DG HIP 1V PORT*L*
1 series · 1 of 1 positions shown · non-contrast
Comparison: AP pelvis 12/21/2013

CLINICAL DATA: Status post left total hip replacement. History of
osteoarthritis.

EXAM:
PORTABLE LEFT HIP - 1 VIEW

[hip x-table]
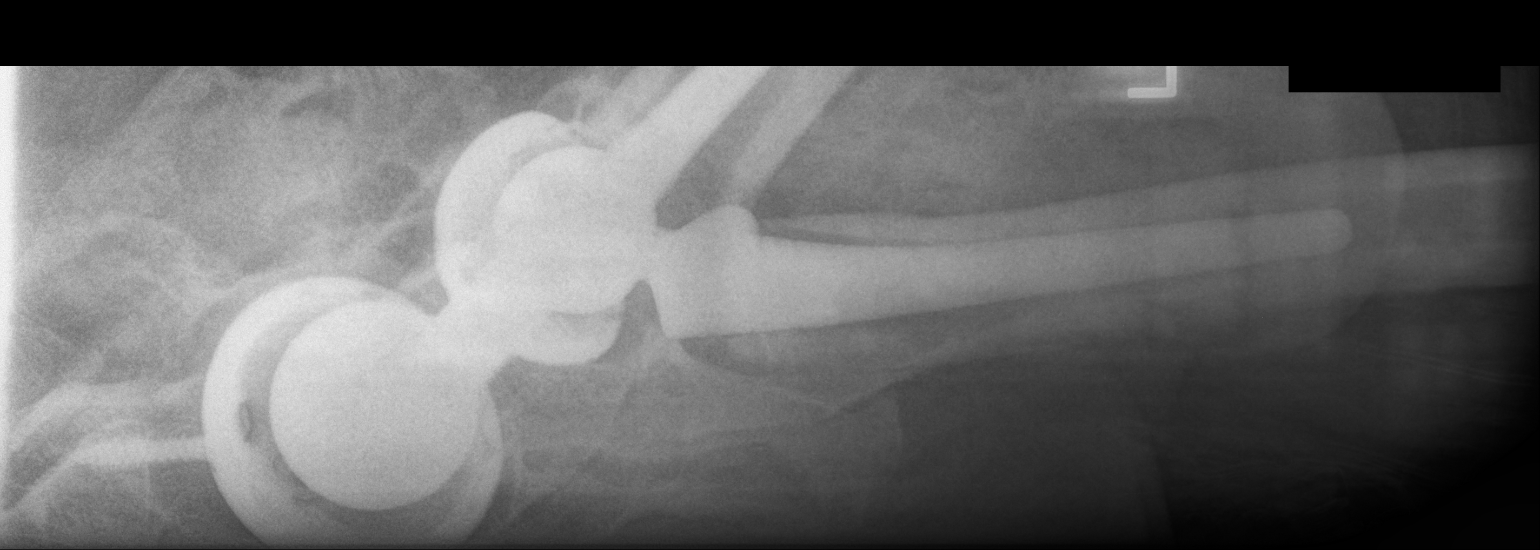

[1 of 1 positions shown; findings below may reference images not displayed]

FINDINGS: The patient has had bilateral total hip arthroplasty. The femoral
heads are well seated in the acetabular components area
IMPRESSION: No evidence for dislocation of either total hip arthroplasty.

## 2016-03-21 ENCOUNTER — Other Ambulatory Visit: Payer: Self-pay | Admitting: Family Medicine

## 2016-03-22 NOTE — Telephone Encounter (Signed)
Ok to refill? Last filled 02/06/16 #30 0RF

## 2016-03-23 NOTE — Telephone Encounter (Signed)
Rx called in as directed.   

## 2016-03-23 NOTE — Telephone Encounter (Signed)
plz phone in. 

## 2016-05-19 ENCOUNTER — Other Ambulatory Visit: Payer: Self-pay | Admitting: Family Medicine

## 2016-05-19 ENCOUNTER — Encounter: Payer: Self-pay | Admitting: *Deleted

## 2016-05-19 NOTE — Telephone Encounter (Signed)
plz phone in. 

## 2016-05-19 NOTE — Telephone Encounter (Signed)
Rx called in to requested pharmacy 

## 2016-05-19 NOTE — Telephone Encounter (Signed)
Ok to refill? Last filled 03/23/16 #30 0RF

## 2016-07-02 ENCOUNTER — Ambulatory Visit: Payer: Self-pay | Admitting: Family Medicine

## 2016-07-12 ENCOUNTER — Other Ambulatory Visit: Payer: Self-pay | Admitting: Family Medicine

## 2016-07-12 DIAGNOSIS — Z125 Encounter for screening for malignant neoplasm of prostate: Secondary | ICD-10-CM

## 2016-07-12 DIAGNOSIS — E785 Hyperlipidemia, unspecified: Secondary | ICD-10-CM

## 2016-07-16 ENCOUNTER — Other Ambulatory Visit (INDEPENDENT_AMBULATORY_CARE_PROVIDER_SITE_OTHER): Payer: BLUE CROSS/BLUE SHIELD

## 2016-07-16 ENCOUNTER — Other Ambulatory Visit: Payer: Self-pay

## 2016-07-16 DIAGNOSIS — E785 Hyperlipidemia, unspecified: Secondary | ICD-10-CM

## 2016-07-16 DIAGNOSIS — Z125 Encounter for screening for malignant neoplasm of prostate: Secondary | ICD-10-CM

## 2016-07-16 LAB — LIPID PANEL
CHOL/HDL RATIO: 3
Cholesterol: 215 mg/dL — ABNORMAL HIGH (ref 0–200)
HDL: 63.7 mg/dL (ref >39.00)
LDL CALC: 129 mg/dL — AB (ref 0–99)
NONHDL: 151.47
Triglycerides: 112 mg/dL (ref 0.0–149.0)
VLDL: 22.4 mg/dL (ref 0.0–40.0)

## 2016-07-16 LAB — COMPREHENSIVE METABOLIC PANEL
ALK PHOS: 34 U/L — AB (ref 39–117)
ALT: 29 U/L (ref 0–53)
AST: 21 U/L (ref 0–37)
Albumin: 4.6 g/dL (ref 3.5–5.2)
BUN: 16 mg/dL (ref 6–23)
CHLORIDE: 103 meq/L (ref 96–112)
CO2: 27 meq/L (ref 19–32)
Calcium: 9.5 mg/dL (ref 8.4–10.5)
Creatinine, Ser: 1.01 mg/dL (ref 0.40–1.50)
GFR: 80.15 mL/min (ref >60.00)
GLUCOSE: 113 mg/dL — AB (ref 70–99)
POTASSIUM: 4.6 meq/L (ref 3.5–5.1)
SODIUM: 137 meq/L (ref 135–145)
Total Bilirubin: 0.4 mg/dL (ref 0.2–1.2)
Total Protein: 7.2 g/dL (ref 6.0–8.3)

## 2016-07-16 LAB — PSA: PSA: 0.83 ng/mL (ref 0.10–4.00)

## 2016-07-16 MED ORDER — LISINOPRIL-HYDROCHLOROTHIAZIDE 20-25 MG PO TABS: ORAL_TABLET | ORAL | 0 refills | Status: DC

## 2016-07-16 NOTE — Telephone Encounter (Signed)
Pt left note requesting refill lisinopril HCTZ. Refill # 30. Pt notified done. Pt has CPX scheduled on 07/30/16.

## 2016-07-30 ENCOUNTER — Ambulatory Visit (INDEPENDENT_AMBULATORY_CARE_PROVIDER_SITE_OTHER): Payer: BLUE CROSS/BLUE SHIELD | Admitting: Family Medicine

## 2016-07-30 ENCOUNTER — Encounter: Payer: Self-pay | Admitting: Family Medicine

## 2016-07-30 VITALS — BP 142/84 | HR 85 | Temp 98.1°F | Ht 68.75 in | Wt 217.5 lb

## 2016-07-30 DIAGNOSIS — F5104 Psychophysiologic insomnia: Secondary | ICD-10-CM | POA: Diagnosis not present

## 2016-07-30 DIAGNOSIS — Z0001 Encounter for general adult medical examination with abnormal findings: Secondary | ICD-10-CM | POA: Diagnosis not present

## 2016-07-30 DIAGNOSIS — Z87891 Personal history of nicotine dependence: Secondary | ICD-10-CM | POA: Diagnosis not present

## 2016-07-30 DIAGNOSIS — M150 Primary generalized (osteo)arthritis: Secondary | ICD-10-CM

## 2016-07-30 DIAGNOSIS — E669 Obesity, unspecified: Secondary | ICD-10-CM

## 2016-07-30 DIAGNOSIS — R454 Irritability and anger: Secondary | ICD-10-CM

## 2016-07-30 DIAGNOSIS — Z0000 Encounter for general adult medical examination without abnormal findings: Secondary | ICD-10-CM

## 2016-07-30 DIAGNOSIS — M8949 Other hypertrophic osteoarthropathy, multiple sites: Secondary | ICD-10-CM

## 2016-07-30 DIAGNOSIS — E66811 Obesity, class 1: Secondary | ICD-10-CM

## 2016-07-30 DIAGNOSIS — E785 Hyperlipidemia, unspecified: Secondary | ICD-10-CM | POA: Diagnosis not present

## 2016-07-30 DIAGNOSIS — M159 Polyosteoarthritis, unspecified: Secondary | ICD-10-CM

## 2016-07-30 DIAGNOSIS — I10 Essential (primary) hypertension: Secondary | ICD-10-CM

## 2016-07-30 MED ORDER — HYDROCHLOROTHIAZIDE 25 MG PO TABS: 25.0000 mg | ORAL_TABLET | Freq: Every day | ORAL | 3 refills | Status: DC

## 2016-07-30 MED ORDER — ALPRAZOLAM 0.5 MG PO TABS: 0.5000 mg | ORAL_TABLET | Freq: Every evening | ORAL | 0 refills | Status: DC | PRN

## 2016-07-30 MED ORDER — ROSUVASTATIN CALCIUM 40 MG PO TABS
40.0000 mg | ORAL_TABLET | Freq: Every day | ORAL | 11 refills | Status: DC
Start: 2016-07-30 — End: 2017-08-10

## 2016-07-30 MED ORDER — LISINOPRIL 30 MG PO TABS: 30.0000 mg | ORAL_TABLET | Freq: Every day | ORAL | 3 refills | Status: DC

## 2016-07-30 NOTE — Progress Notes (Signed)
BP (!) 142/84   Pulse 85   Temp 98.1 F (36.7 C) (Oral)   Ht 5' 8.75" (1.746 m)   Wt 217 lb 8 oz (98.7 kg)   SpO2 95%   BMI 32.35 kg/m    CC: CPE Subjective:    Patient ID: Eric Delgado, male    DOB: 01/01/1956, 61 y.o.   MRN: [id number]  HPI: Eric Delgado is a 61 y.o. male presenting on 07/30/2016 for Annual Exam and Arthritis   DOT CPE with [facility] (12/2015).  Mood - did not tolerate prozac, zoloft, trazodone. Takes xanax 1/2 tab prn sleep. Stays irritable.   HTN - he checks blood pressures regularly 2-3 times weekly. Running >973 systolic. Stays well hydrated.   HLD - takes 1/2 tab crestor 40mg  daily, some missed doses.   Worsening hand pains especially in the mornings. H/o hip osteoarthritis  Pt and wife wonder about ADD.   Chronic sleep maintenance insomnia - on xanax 1/2 tab for sleep, chronically. Snores. Daytime somnolence. No witnessed apneic episodes. No significant PNDyspnea. Non-restorative sleep. Bedtime 10-11pm, wakes up at 4am. Does drink caffeine daily.   Preventative: COLONOSCOPY Date: 10/2010 WNL, rpt 10 yrs (Dr. Glennon [facility]) Prostate cancer screening - discussed - requests PSA and agrees to DRE this year Td 2009 Flu shot yearly Seat belt use discussed  Sunscreen use discussed. No changing moles on skin.  Ex smoker - quit 2011. Second hand smoke at work.  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 Interim medical history since our last visit reviewed. Allergies and medications reviewed and updated. Outpatient Medications Prior to Visit  Medication Sig Dispense Refill  . aspirin 81 MG tablet Take 81 mg by mouth daily.    .  Coenzyme Q10 (CO Q 10 PO) Take 1 tablet by mouth every morning.    . Multiple Vitamin (MULTIVITAMIN WITH MINERALS) TABS tablet Take 1 tablet by mouth every morning.    Marland Kitchen ALPRAZolam (XANAX) 1 MG tablet TAKE ONE TABLET BY MOUTH AT BEDTIME AS NEEDED 30 tablet 0  . lisinopril-hydrochlorothiazide (PRINZIDE,ZESTORETIC) 20-25 MG tablet TAKE ONE TABLET BY MOUTH EVERY MORNING **NEED TO KEEP ANNUAL PHYSICAL!** 30 tablet 0  . rosuvastatin (CRESTOR) 40 MG tablet Take 1 tablet (40 mg total) by mouth at bedtime. 30 tablet 11  . GARCINIA CAMBOGIA-CHROMIUM PO Take 1 tablet by mouth daily.    . Probiotic Product (PROBIOTIC ADVANCED PO) Take 1 tablet by mouth daily.    . TURMERIC PO Take 1 tablet by mouth daily.     No facility-administered medications prior to visit.      Per HPI unless specifically indicated in ROS section below Review of Systems  Constitutional: Negative for activity change, appetite change, chills, fatigue, fever and unexpected weight change.  HENT: Negative for hearing loss.   Eyes: Negative for visual disturbance.  Respiratory: Negative for cough, chest tightness, shortness of breath and wheezing.   Cardiovascular: Negative for chest pain, palpitations and leg swelling.  Gastrointestinal: Negative for abdominal distention, abdominal pain, blood in stool, constipation, diarrhea, nausea and vomiting.  Genitourinary: Negative for difficulty urinating and hematuria.  Musculoskeletal: Negative for arthralgias, myalgias and neck pain.  Skin: Negative for rash.  Neurological: Negative  for dizziness, seizures, syncope and headaches.  Hematological: Negative for adenopathy. Does not bruise/bleed easily.  Psychiatric/Behavioral: Negative for dysphoric mood. The patient is not nervous/anxious.       Objective:    BP (!) 142/84   Pulse 85   Temp 98.1 F (36.7 C) (Oral)   Ht 5' 8.75" (1.746 m)   Wt 217 lb 8 oz (98.7 kg)   SpO2 95%   BMI 32.35 kg/m   Wt Readings from Last 3 Encounters:    07/30/16 217 lb 8 oz (98.7 kg)  03/21/15 216 lb 4 oz (98.1 kg)  03/01/14 216 lb 8 oz (98.2 kg)    Physical Exam  Constitutional: He is oriented to person, place, and time. He appears well-developed and well-nourished. No distress.  HENT:  Head: Normocephalic and atraumatic.  Right Ear: Hearing, tympanic membrane, external ear and ear canal normal.  Left Ear: Hearing, tympanic membrane, external ear and ear canal normal.  Nose: Nose normal.  Mouth/Throat: Uvula is midline, oropharynx is clear and moist and mucous membranes are normal. No oropharyngeal exudate, posterior oropharyngeal edema or posterior oropharyngeal erythema.  Eyes: Conjunctivae and EOM are normal. Pupils are equal, round, and reactive to light. No scleral icterus.  Neck: Normal range of motion. Neck supple.  Cardiovascular: Normal rate, regular rhythm, normal heart sounds and intact distal pulses.   No murmur heard. Pulses:      Radial pulses are 2+ on the right side, and 2+ on the left side.  Pulmonary/Chest: Effort normal and breath sounds normal. No respiratory distress. He has no wheezes. He has no rales.  Abdominal: Soft. Bowel sounds are normal. He exhibits no distension and no mass. There is no tenderness. There is no rebound and no guarding.  Genitourinary: Rectum normal and prostate normal. Rectal exam shows no external hemorrhoid, no internal hemorrhoid, no fissure, no mass, no tenderness and anal tone normal. Prostate is not enlarged and not tender.  Musculoskeletal: Normal range of motion. He exhibits no edema.  Lymphadenopathy:    He has no cervical adenopathy.  Neurological: He is alert and oriented to person, place, and time.  CN grossly intact, station and gait intact  Skin: Skin is warm and dry. No rash noted.  Psychiatric: He has a normal mood and affect. His behavior is normal. Judgment and thought content normal.  Nursing note and vitals reviewed.  Results for orders placed or performed in visit on  07/16/16  Lipid panel  Result Value Ref Range   Cholesterol 215 (H) 0 - 200 mg/dL   Triglycerides 112.0 0.0 - 149.0 mg/dL   HDL 63.70 >39.00 mg/dL   VLDL 22.4 0.0 - 40.0 mg/dL   LDL Cholesterol 129 (H) 0 - 99 mg/dL   Total CHOL/HDL Ratio 3    NonHDL 151.47   Comprehensive metabolic panel  Result Value Ref Range   Sodium 137 135 - 145 mEq/L   Potassium 4.6 3.5 - 5.1 mEq/L   Chloride 103 96 - 112 mEq/L   CO2 27 19 - 32 mEq/L   Glucose, Bld 113 (H) 70 - 99 mg/dL   BUN 16 6 - 23 mg/dL   Creatinine, Ser 1.01 0.40 - 1.50 mg/dL   Total Bilirubin 0.4 0.2 - 1.2 mg/dL   Alkaline Phosphatase 34 (L) 39 - 117 U/L   AST 21 0 - 37 U/L   ALT 29 0 - 53 U/L   Total Protein 7.2 6.0 - 8.3 g/dL   Albumin 4.6 3.5 - 5.2 g/dL  Calcium 9.5 8.4 - 10.5 mg/dL   GFR 80.15 >60.00 mL/min  PSA  Result Value Ref Range   PSA 0.83 0.10 - 4.00 ng/mL      Assessment & Plan:   Problem List Items Addressed This Visit    Chronic insomnia    Chronic sleep maintenance insomnia which has been managed long term on xanax 0.5mg  nightly. Has tried and failed trazodone. Discussed risks of long term xanax use. Pt open to idea of slow taper - will Rx xanax 0.5mg  1/2 tab nightly to try taper. Suggested trial benadryl or melatonin at night. Discussed relation of nightly alcohol use to sleep maintenance insomnia as I anticipate this is contributing - encouraged slowly decreasing alcohol consumption. Sleep hygiene discussed, handout provided.  Endorses daytime somnolence, snoring. ESS = 9. Discussed concerns with OSA and long term consequences of untreated sleep apnea. Discussed driving risks. At this point in time, patient is not ready to pursue further evaluation but he will review this with wife and let me know if desires further evaluation.       Essential hypertension    Chronic, persistently mildly elevated in office and at home.  Will increase lisinopril to 30mg  daily, continue hctz 25mg  (split into individual  components)      Relevant Medications   lisinopril (PRINIVIL,ZESTRIL) 30 MG tablet   hydrochlorothiazide (HYDRODIURIL) 25 MG tablet   rosuvastatin (CRESTOR) 40 MG tablet   Ex-smoker    Remains abstinent.       HLD (hyperlipidemia)    Chronic. Takes crestor 1/2 tab daily, as well as coQ10 - encouraged continue. The 10-year ASCVD risk score Mikey Bussing DC Brooke Bonito., et al., 2013) is: 9.9%   Values used to calculate the score:     Age: 11 years     Sex: Male     Is Non-Hispanic African American: No     Diabetic: No     Tobacco smoker: No     Systolic Blood Pressure: 128 mmHg     Is BP treated: Yes     HDL Cholesterol: 63.7 mg/dL     Total Cholesterol: 215 mg/dL       Relevant Medications   lisinopril (PRINIVIL,ZESTRIL) 30 MG tablet   hydrochlorothiazide (HYDRODIURIL) 25 MG tablet   rosuvastatin (CRESTOR) 40 MG tablet   Irritability    Chronic problem. Has been tried on several antidepressants including prozac, zoloft, trazodone without improvement. Endorses PHQ2 today = 0.       Obesity, Class I, BMI 30-34.9    Discussed healthy diet and lifestyle changes to affect sustainable weight loss. Encouraged backing off daily alcohol use. Encouraged starting regular walking routine. Discussed AHA 150 min/wk mod intensity aerobic exercise recommendations      Osteoarthritis, multiple sites    S/p L hip replacement. Now endorsing worsening hand joint pain/stiffness with evidence of hypertrophied nodules at DIPs of hands. Discussed not consistent with RA, likely OA. Discussed options - pain control and consider trial of glucosamine.       Routine health maintenance - Primary    Preventative protocols reviewed and updated unless pt declined. Discussed healthy diet and lifestyle.           Follow up plan: Return in about 1 year (around 07/30/2017), or if symptoms worsen or fail to improve, for annual exam, prior fasting for blood work.  Ria Bush, MD

## 2016-07-30 NOTE — Assessment & Plan Note (Addendum)
Chronic. Takes crestor 1/2 tab daily, as well as coQ10 - encouraged continue. The 10-year ASCVD risk score Mikey Bussing DC Brooke Bonito., et al., 2013) is: 9.9%   Values used to calculate the score:     Age: 61 years     Sex: Male     Is Non-Hispanic African American: No     Diabetic: No     Tobacco smoker: No     Systolic Blood Pressure: 741 mmHg     Is BP treated: Yes     HDL Cholesterol: 63.7 mg/dL     Total Cholesterol: 215 mg/dL

## 2016-07-30 NOTE — Patient Instructions (Addendum)
Change blood pressure medicines to separate lisinopril 30mg  and hydrochorothiazide 25mg  daily.  Try backing off some alcohol at bedtime. Try lower xanax dose printed today.  May try melatonin 5mg  at bedtime (sleep hormone) or benadryl 25mg  for sleep.  Return as needed or in 1 year for next physical.  Sleep hygiene checklist: 1. Avoid naps during the day 2. Avoid stimulants such as caffeine and nicotine. Avoid bedtime alcohol (it can speed onset of sleep but the body's metabolism can cause awakenings). 3. All forms of exercise help ensure sound sleep - limit vigorous exercise to morning or late afternoon 4. Avoid food too close to bedtime including chocolate (which contains caffeine) 5. Soak up natural light 6. Establish regular bedtime routine. 7. Associate bed with sleep - avoid TV, computer or phone, reading while in bed. 8. Ensure pleasant, relaxing sleep environment - quiet, dark, cool room.    Health Maintenance, Male A healthy lifestyle and preventive care is important for your health and wellness. Ask your health care provider about what schedule of regular examinations is right for you. What should I know about weight and diet? Eat a Healthy Diet  Eat plenty of vegetables, fruits, whole grains, low-fat dairy products, and lean protein.  Do not eat a lot of foods high in solid fats, added sugars, or salt.  Maintain a Healthy Weight Regular exercise can help you achieve or maintain a healthy weight. You should:  Do at least 150 minutes of exercise each week. The exercise should increase your heart rate and make you sweat (moderate-intensity exercise).  Do strength-training exercises at least twice a week.  Watch Your Levels of Cholesterol and Blood Lipids  Have your blood tested for lipids and cholesterol every 5 years starting at 60 years of age. If you are at high risk for heart disease, you should start having your blood tested when you are 60 years old. You may need to  have your cholesterol levels checked more often if: ? Your lipid or cholesterol levels are high. ? You are older than 60 years of age. ? You are at high risk for heart disease.  What should I know about cancer screening? Many types of cancers can be detected early and may often be prevented. Lung Cancer  You should be screened every year for lung cancer if: ? You are a current smoker who has smoked for at least 30 years. ? You are a former smoker who has quit within the past 15 years.  Talk to your health care provider about your screening options, when you should start screening, and how often you should be screened.  Colorectal Cancer  Routine colorectal cancer screening usually begins at 60 years of age and should be repeated every 5-10 years until you are 60 years old. You may need to be screened more often if early forms of precancerous polyps or small growths are found. Your health care provider may recommend screening at an earlier age if you have risk factors for colon cancer.  Your health care provider may recommend using home test kits to check for hidden blood in the stool.  A small camera at the end of a tube can be used to examine your colon (sigmoidoscopy or colonoscopy). This checks for the earliest forms of colorectal cancer.  Prostate and Testicular Cancer  Depending on your age and overall health, your health care provider may do certain tests to screen for prostate and testicular cancer.  Talk to your health care provider about  any symptoms or concerns you have about testicular or prostate cancer.  Skin Cancer  Check your skin from head to toe regularly.  Tell your health care provider about any new moles or changes in moles, especially if: ? There is a change in a mole's size, shape, or color. ? You have a mole that is larger than a pencil eraser.  Always use sunscreen. Apply sunscreen liberally and repeat throughout the day.  Protect yourself by wearing  long sleeves, pants, a wide-brimmed hat, and sunglasses when outside.  What should I know about heart disease, diabetes, and high blood pressure?  If you are 72-64 years of age, have your blood pressure checked every 3-5 years. If you are 72 years of age or older, have your blood pressure checked every year. You should have your blood pressure measured twice-once when you are at a hospital or clinic, and once when you are not at a hospital or clinic. Record the average of the two measurements. To check your blood pressure when you are not at a hospital or clinic, you can use: ? An automated blood pressure machine at a pharmacy. ? A home blood pressure monitor.  Talk to your health care provider about your target blood pressure.  If you are between 32-51 years old, ask your health care provider if you should take aspirin to prevent heart disease.  Have regular diabetes screenings by checking your fasting blood sugar level. ? If you are at a normal weight and have a low risk for diabetes, have this test once every three years after the age of 106. ? If you are overweight and have a high risk for diabetes, consider being tested at a younger age or more often.  A one-time screening for abdominal aortic aneurysm (AAA) by ultrasound is recommended for men aged 23-75 years who are current or former smokers. What should I know about preventing infection? Hepatitis B If you have a higher risk for hepatitis B, you should be screened for this virus. Talk with your health care provider to find out if you are at risk for hepatitis B infection. Hepatitis C Blood testing is recommended for:  Everyone born from [birth year] through 1964.  Anyone with known risk factors for hepatitis C.  Sexually Transmitted Diseases (STDs)  You should be screened each year for STDs including gonorrhea and chlamydia if: ? You are sexually active and are younger than 60 years of age. ? You are older than 60 years of age and your  health care provider tells you that you are at risk for this type of infection. ? Your sexual activity has changed since you were last screened and you are at an increased risk for chlamydia or gonorrhea. Ask your health care provider if you are at risk.  Talk with your health care provider about whether you are at high risk of being infected with HIV. Your health care provider may recommend a prescription medicine to help prevent HIV infection.  What else can I do?  Schedule regular health, dental, and eye exams.  Stay current with your vaccines (immunizations).  Do not use any tobacco products, such as cigarettes, chewing tobacco, and e-cigarettes. If you need help quitting, ask your health care provider.  Limit alcohol intake to no more than 2 drinks per day. One drink equals 12 ounces of beer, 5 ounces of wine, or 1 ounces of hard liquor.  Do not use street drugs.  Do not share needles.  Ask your health  care provider for help if you need support or information about quitting drugs.  Tell your health care provider if you often feel depressed.  Tell your health care provider if you have ever been abused or do not feel safe at home. This information is not intended to replace advice given to you by your health care provider. Make sure you discuss any questions you have with your health care provider. Document Released: 07/31/2007 Document Revised: 10/01/2015 Document Reviewed: 11/05/2014 Elsevier Interactive Patient Education  [employer].

## 2016-07-30 NOTE — Assessment & Plan Note (Signed)
Preventative protocols reviewed and updated unless pt declined. Discussed healthy diet and lifestyle.  

## 2016-07-31 ENCOUNTER — Other Ambulatory Visit: Payer: Self-pay | Admitting: Family Medicine

## 2016-07-31 ENCOUNTER — Encounter: Payer: Self-pay | Admitting: Family Medicine

## 2016-07-31 DIAGNOSIS — E669 Obesity, unspecified: Secondary | ICD-10-CM | POA: Insufficient documentation

## 2016-07-31 DIAGNOSIS — F5104 Psychophysiologic insomnia: Secondary | ICD-10-CM | POA: Insufficient documentation

## 2016-07-31 NOTE — Assessment & Plan Note (Signed)
Chronic, persistently mildly elevated in office and at home.  Will increase lisinopril to 30mg  daily, continue hctz 25mg  (split into individual components)

## 2016-07-31 NOTE — Assessment & Plan Note (Addendum)
Discussed healthy diet and lifestyle changes to affect sustainable weight loss. Encouraged backing off daily alcohol use. Encouraged starting regular walking routine. Discussed AHA 150 min/wk mod intensity aerobic exercise recommendations

## 2016-07-31 NOTE — Assessment & Plan Note (Addendum)
Chronic sleep maintenance insomnia which has been managed long term on xanax 0.5mg  nightly. Has tried and failed trazodone. Discussed risks of long term xanax use. Pt open to idea of slow taper - will Rx xanax 0.5mg  1/2 tab nightly to try taper. Suggested trial benadryl or melatonin at night. Discussed relation of nightly alcohol use to sleep maintenance insomnia as I anticipate this is contributing - encouraged slowly decreasing alcohol consumption. Sleep hygiene discussed, handout provided.  Endorses daytime somnolence, snoring. ESS = 9. Discussed concerns with OSA and long term consequences of untreated sleep apnea. Discussed driving risks. At this point in time, patient is not ready to pursue further evaluation but he will review this with wife and let me know if desires further evaluation.

## 2016-07-31 NOTE — Assessment & Plan Note (Signed)
S/p L hip replacement. Now endorsing worsening hand joint pain/stiffness with evidence of hypertrophied nodules at DIPs of hands. Discussed not consistent with RA, likely OA. Discussed options - pain control and consider trial of glucosamine.

## 2016-07-31 NOTE — Assessment & Plan Note (Signed)
Remains abstinent 

## 2016-07-31 NOTE — Assessment & Plan Note (Addendum)
Chronic problem. Has been tried on several antidepressants including prozac, zoloft, trazodone without improvement. Endorses PHQ2 today = 0.

## 2016-09-24 ENCOUNTER — Ambulatory Visit (INDEPENDENT_AMBULATORY_CARE_PROVIDER_SITE_OTHER): Payer: BLUE CROSS/BLUE SHIELD | Admitting: Family Medicine

## 2016-09-24 ENCOUNTER — Encounter: Payer: Self-pay | Admitting: Family Medicine

## 2016-09-24 VITALS — BP 138/80 | HR 75 | Temp 97.5°F | Resp 16 | Ht 69.5 in | Wt 218.0 lb

## 2016-09-24 DIAGNOSIS — N50811 Right testicular pain: Secondary | ICD-10-CM | POA: Diagnosis not present

## 2016-09-24 MED ORDER — ALPRAZOLAM 0.5 MG PO TABS: 0.5000 mg | ORAL_TABLET | Freq: Every evening | ORAL | 0 refills | Status: DC | PRN

## 2016-09-24 NOTE — Patient Instructions (Addendum)
You do have right swollen scrotum and testicle. I recommended ultrasound today to rule out testicular torsion. You declined.  If you get worse over weekend, seek urgent care.  Over weekend, rest, elevate scrotum (towel under scrotum when sitting), and continue ibuprofen 800mg  with meals (max 3 times a day).

## 2016-09-24 NOTE — Assessment & Plan Note (Signed)
R testicular pain and swelling after heavy lifting yesterday. Today better. Exam not consistent with groin strain. Ddx includes inguinal hernia, epididymitis/orchitis, hydrocele, r/o testicular torsion. Advised I would recommend urgent scrotal US thsi afternoon at hospital to r/o testicular torsion. He refused, initially concerned about claustrophobia. I explained ultrasound procedure with him and he was reassured. He however continued to refuse [place] today and states he will just "watch over the weekend". Advised of seriousness of testicular torsion and urgent need for intervention if this is the case, discussed risks of testicular necrosis from ischemia/rapid loss of viability. He continues to refuse recommended non-invasive imaging study and aware this is AMA.  Will continue ibuprofen, scrotal elevation over weekend. Advised to go to ER over weekend if worsening - he stated he wouldn't.

## 2016-09-24 NOTE — Progress Notes (Signed)
BP 138/80   Pulse 75   Temp (!) 97.5 F (36.4 C) (Oral)   Resp 16   Ht 5' 9.5" (1.765 m)   Wt 218 lb (98.9 kg)   SpO2 97%   BMI 31.73 kg/m    CC: R groin pain Subjective:    Patient ID: Eric Delgado, male    DOB: [birth year], 61 y.o.   MRN: [id number]  HPI: Eric Delgado is a 61 y.o. 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 Progressively worsening R groin pain, then last night noticed swelling of R testicle/scrotum, red and warm. Trouble sitting due to pain. Denies fevers.   Treating with ibuprofen 800mg  which has helped. Today feeling some better.   Requests xanax refill - takes for chronic sleep maintenance insomnia. H/o mood disorder stays irritable. Has not tolerated SSRIs or trazodone. We have been working on slow taper off xanax - last visit recommended 0.5mg  1/2 tab nightly. He did not do this and instead continues 1 tab PRN  Relevant past medical, surgical, family and social history reviewed and updated as indicated. Interim medical history since our last visit reviewed. Allergies and medications reviewed and updated. Outpatient Medications Prior to Visit  Medication Sig Dispense Refill  . aspirin 81 MG tablet Take 81 mg by mouth daily.    . Coenzyme Q10 (CO Q 10 PO) Take 1 tablet by mouth every morning.    . hydrochlorothiazide (HYDRODIURIL) 25 MG tablet Take 1 tablet (25 mg total) by mouth daily. 90 tablet 3  . lisinopril (PRINIVIL,ZESTRIL) 30 MG tablet Take 1 tablet (30 mg total) by mouth daily. 90 tablet 3  . Multiple Vitamin (MULTIVITAMIN WITH MINERALS) TABS tablet Take 1 tablet by mouth every morning.    . rosuvastatin (CRESTOR) 40 MG tablet Take 1 tablet (40 mg total) by mouth at bedtime. 30 tablet 11  . ALPRAZolam (XANAX) 0.5 MG tablet Take 1 tablet (0.5 mg total) by mouth at bedtime as needed. 30 tablet 0   No facility-administered medications prior to visit.      Per HPI  unless specifically indicated in ROS section below Review of Systems     Objective:    BP 138/80   Pulse 75   Temp (!) 97.5 F (36.4 C) (Oral)   Resp 16   Ht 5' 9.5" (1.765 m)   Wt 218 lb (98.9 kg)   SpO2 97%   BMI 31.73 kg/m   Wt Readings from Last 3 Encounters:  09/24/16 218 lb (98.9 kg)  07/30/16 217 lb 8 oz (98.7 kg)  03/21/15 216 lb 4 oz (98.1 kg)    Physical Exam  Constitutional: He appears well-developed and well-nourished. No distress.  Abdominal: Soft. Normal appearance and bowel sounds are normal. He exhibits no distension and no mass. There is no hepatosplenomegaly. There is no tenderness. There is no rigidity, no rebound, no guarding and negative Murphy's sign. No hernia. Hernia confirmed negative in the right inguinal area and confirmed negative in the left inguinal area.  No hernias appreciated  Genitourinary: Penis normal. Right testis shows swelling and tenderness. Right testis shows no mass. Right testis is descended. Left testis shows no mass, no swelling and no tenderness. Left testis is descended.  Genitourinary Comments: Tender and swollen testicle and epididymis on right  Musculoskeletal: Normal range of motion.  No pain with testing of hip abductor/adductor/flexor/extensors against resistance  Lymphadenopathy:  Right: No inguinal adenopathy present.       Left: No inguinal adenopathy present.  Nursing note and vitals reviewed.      Assessment & Plan:   Problem List Items Addressed This Visit    Right testicular pain - Primary    R testicular pain and swelling after heavy lifting yesterday. Today better. Exam not consistent with groin strain. Ddx includes inguinal hernia, epididymitis/orchitis, hydrocele, r/o testicular torsion. Advised I would recommend urgent scrotal US thsi afternoon at hospital to r/o testicular torsion. He refused, initially concerned about claustrophobia. I explained ultrasound procedure with him and he was reassured. He  however continued to refuse [place] today and states he will just "watch over the weekend". Advised of seriousness of testicular torsion and urgent need for intervention if this is the case, discussed risks of testicular necrosis from ischemia/rapid loss of viability. He continues to refuse recommended non-invasive imaging study and aware this is AMA.  Will continue ibuprofen, scrotal elevation over weekend. Advised to go to ER over weekend if worsening - he stated he wouldn't.           Follow up plan: Return if symptoms worsen or fail to improve.  Ria Bush, MD

## 2016-12-24 DIAGNOSIS — Z23 Encounter for immunization: Secondary | ICD-10-CM | POA: Diagnosis not present

## 2017-01-01 ENCOUNTER — Other Ambulatory Visit: Payer: Self-pay | Admitting: Family Medicine

## 2017-01-02 NOTE — Telephone Encounter (Signed)
plz phone in. 

## 2017-01-03 NOTE — Telephone Encounter (Signed)
Refill left on vm at pharmacy per Dr. G.   

## 2017-02-11 ENCOUNTER — Other Ambulatory Visit: Payer: Self-pay | Admitting: Family Medicine

## 2017-02-11 NOTE — Telephone Encounter (Signed)
Last filled:  01/03/17, #30 Last OV:  09/24/16 Next OV:  none

## 2017-02-13 NOTE — Telephone Encounter (Signed)
Sent electronically 

## 2017-04-02 ENCOUNTER — Other Ambulatory Visit: Payer: Self-pay | Admitting: Family Medicine

## 2017-04-03 NOTE — Telephone Encounter (Signed)
Eprescribed.

## 2017-04-29 DIAGNOSIS — L853 Xerosis cutis: Secondary | ICD-10-CM | POA: Diagnosis not present

## 2017-04-29 DIAGNOSIS — L570 Actinic keratosis: Secondary | ICD-10-CM | POA: Diagnosis not present

## 2017-05-09 ENCOUNTER — Other Ambulatory Visit: Payer: Self-pay | Admitting: Family Medicine

## 2017-05-10 NOTE — Telephone Encounter (Signed)
Last filled: 04/03/17 #30 Last OV: 09/24/16 No future OV

## 2017-05-13 NOTE — Telephone Encounter (Signed)
Eprescribed.

## 2017-06-07 ENCOUNTER — Other Ambulatory Visit: Payer: Self-pay | Admitting: Family Medicine

## 2017-06-07 NOTE — Telephone Encounter (Signed)
Last filled:  05/13/17, #30 Last OV:  09/24/16 Next OV:  none

## 2017-06-09 NOTE — Telephone Encounter (Signed)
E prescribed CPE will be due 07/2017.

## 2017-07-12 ENCOUNTER — Other Ambulatory Visit: Payer: Self-pay | Admitting: Family Medicine

## 2017-07-13 NOTE — Telephone Encounter (Signed)
Last filled:  06/11/17, #30 Last OV:  09/24/16 Next OV:  None.

## 2017-07-13 NOTE — Telephone Encounter (Signed)
E prescribed. Due for physical next month.

## 2017-08-10 ENCOUNTER — Other Ambulatory Visit: Payer: Self-pay | Admitting: Family Medicine

## 2017-08-12 DIAGNOSIS — E785 Hyperlipidemia, unspecified: Secondary | ICD-10-CM | POA: Diagnosis not present

## 2017-08-12 DIAGNOSIS — M1990 Unspecified osteoarthritis, unspecified site: Secondary | ICD-10-CM | POA: Diagnosis not present

## 2017-08-12 DIAGNOSIS — Z125 Encounter for screening for malignant neoplasm of prostate: Secondary | ICD-10-CM | POA: Diagnosis not present

## 2017-08-12 DIAGNOSIS — I10 Essential (primary) hypertension: Secondary | ICD-10-CM | POA: Diagnosis not present

## 2017-08-15 DIAGNOSIS — E785 Hyperlipidemia, unspecified: Secondary | ICD-10-CM | POA: Diagnosis not present

## 2017-08-15 DIAGNOSIS — E781 Pure hyperglyceridemia: Secondary | ICD-10-CM | POA: Diagnosis not present

## 2017-08-15 DIAGNOSIS — Z125 Encounter for screening for malignant neoplasm of prostate: Secondary | ICD-10-CM | POA: Diagnosis not present

## 2017-08-15 DIAGNOSIS — M1990 Unspecified osteoarthritis, unspecified site: Secondary | ICD-10-CM | POA: Diagnosis not present

## 2017-08-15 DIAGNOSIS — I10 Essential (primary) hypertension: Secondary | ICD-10-CM | POA: Diagnosis not present

## 2017-12-24 ENCOUNTER — Other Ambulatory Visit: Payer: Self-pay | Admitting: Family Medicine

## 2018-08-25 ENCOUNTER — Other Ambulatory Visit: Payer: Self-pay

## 2018-08-25 ENCOUNTER — Ambulatory Visit: Payer: Self-pay

## 2018-08-25 ENCOUNTER — Encounter: Payer: Self-pay | Admitting: Orthopedic Surgery

## 2018-08-25 ENCOUNTER — Ambulatory Visit (INDEPENDENT_AMBULATORY_CARE_PROVIDER_SITE_OTHER): Payer: BC Managed Care – PPO | Admitting: Orthopedic Surgery

## 2018-08-25 DIAGNOSIS — M25562 Pain in left knee: Secondary | ICD-10-CM

## 2018-08-25 DIAGNOSIS — M25462 Effusion, left knee: Secondary | ICD-10-CM

## 2018-08-25 MED ORDER — METHYLPREDNISOLONE ACETATE 40 MG/ML IJ SUSP
40.0000 mg | INTRAMUSCULAR | Status: AC | PRN
  Administered 2018-08-25: 40 mg via INTRA_ARTICULAR

## 2018-08-25 MED ORDER — LIDOCAINE HCL 1 % IJ SOLN
5.0000 mL | INTRAMUSCULAR | Status: AC | PRN
  Administered 2018-08-25: 5 mL

## 2018-08-25 MED ORDER — BUPIVACAINE HCL 0.25 % IJ SOLN
4.0000 mL | INTRAMUSCULAR | Status: AC | PRN
  Administered 2018-08-25: 21:00:00 4 mL via INTRA_ARTICULAR

## 2018-08-25 NOTE — Progress Notes (Addendum)
Office Visit Note   Patient: Eric Delgado           Date of Birth: March 05, 1955           MRN: [id number] Visit Date: 08/25/2018 Requested by: Eric Bush, MD [place],  [place] [address] PCP: Eric Bush, MD  Subjective: Chief Complaint  Patient presents with  . Left Knee - Pain    HPI: Eric Delgado is a patient with left knee pain ongoing for several months.  Cannot recall a specific injury.  Does describe pain posteriorly when he is trying to flex his knee.  Has had bilateral hip replacements as well as a hamstring injury months ago.  Localizes the pain posteriorly.  He is okay going up stairs but has a hard time going downstairs.  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  Denies any discrete mechanical symptoms but does report a Engineer, structural when he tries to load it with the knee bent.              ROS: All systems reviewed are negative as they relate to the chief complaint within the history of present illness.  Patient denies  fevers or chills.   Assessment & Plan: Visit Diagnoses:  1. Left knee pain, unspecified chronicity     Plan: Impression is left knee pain with primarily posterior pain and trace effusion.  Hard to say exactly what is going on in the knee.  Radiographs are unrevealing.  Exam also is unrevealing in terms of absence of focal joint line tenderness and stable collateral cruciate ligaments.  Only a trace effusion is present.  I think he may have either some hamstring tendinitis or potentially some type of posterior meniscal pathology.  Him to try an injection into the left knee today and he is to call me in a couple of weeks if is not better.  If not I would favor MRI scanning to make sure there is nothing structural going on that could be corrected.  He does have a very physical job and needs to be 800% in order to accomplish that.  He does have some vacation coming up.  Follow-Up Instructions: Return if symptoms worsen or fail to improve.    Orders:  Orders Placed This Encounter  Procedures  . XR KNEE 3 VIEW LEFT   No orders of the defined types were placed in this encounter.     Procedures: Large Joint Inj: R knee on 08/25/2018 9:22 PM Indications: diagnostic evaluation, joint swelling and pain Details: 18 G 1.5 in needle, superolateral approach  Arthrogram: No  Medications: 5 mL lidocaine 1 %; 40 mg methylPREDNISolone acetate 40 MG/ML; 4 mL bupivacaine 0.25 % Outcome: tolerated well, no immediate complications Procedure, treatment alternatives, risks and benefits explained, specific risks discussed. Consent was given by the patient. Immediately prior to procedure a time out was called to verify the correct patient, procedure, equipment, support staff and site/side marked as required. Patient was prepped and draped in the usual sterile fashion.       Clinical Data: No additional findings.  Objective: Vital Signs: There were no vitals taken for this visit.  Physical Exam:   Constitutional: Patient appears well-developed HEENT:  Head: Normocephalic Eyes:EOM are normal Neck: Normal range of motion Cardiovascular: Normal rate Pulmonary/chest: Effort normal Neurologic: Patient is alert Skin: Skin is warm Psychiatric: Patient has normal mood and affect    Ortho Exam: Ortho exam demonstrates full active and passive range of motion  of that left knee with trace effusion collateral crucial ligaments are stable.  No focal joint line tenderness medially or laterally.  Pedal pulses palpable.  No groin pain with internal X rotation of the leg.  No masses lymphadenopathy or skin changes noted in that left knee region  Specialty Comments:  No specialty comments available.  Imaging: Xr Knee 3 View Left  Result Date: 08/25/2018 AP lateral merchant left knee reviewed.  Mild medial joint space narrowing is present.  Tibial tubercle ossicles are noted.  No acute fracture or dislocation present.    PMFS History:  Patient Active Problem List   Diagnosis Date Noted  . Right testicular pain 09/24/2016  . Obesity, Class I, BMI 30-34.9 07/31/2016  . Chronic insomnia 07/31/2016  . Oral mucosal lesion 03/01/2014  . Osteoarthritis, multiple sites 12/21/2013  . Status post total replacement of left hip 12/21/2013  . Routine health maintenance 10/16/2010  . Ex-smoker 10/16/2010  . HLD (hyperlipidemia) 12/17/2006  . Irritability 12/17/2006  . Essential hypertension 12/17/2006  . Carpal tunnel syndrome 12/17/2006   Past Medical History:  Diagnosis Date  . Arthritis   . Difficulty sleeping    takes xanax for sleep  . Hyperlipidemia   . Hypertension   . SCIATICA, RIGHT 10/12/2007   Qualifier: Diagnosis of  By: Linda Hedges MD, Heinz Knuckles     Family History  Problem Relation Age of Onset  . Macular degeneration Mother   . Hypertension Mother   . Hyperlipidemia Mother   . Cancer Mother 75       lung (small cell), smoker   . Stroke Maternal Aunt   . Diabetes Neg Hx   . CAD Neg Hx     Past Surgical History:  Procedure Laterality Date  . APPENDECTOMY  1975  . CATARACT EXTRACTION Bilateral 02/2015   [facility]  . COLONOSCOPY  10/2010   WNL, rpt 10 yrs (Dr. [facility])  . ROTATOR CUFF REPAIR     left  . TOTAL HIP ARTHROPLASTY Right Spring '12   Dr. Alvan Dame - ceramic ball, fiber stem  . TOTAL HIP ARTHROPLASTY Left 12/21/2013   Procedure: LEFT TOTAL HIP ARTHROPLASTY ANTERIOR APPROACH;  Surgeon: Mcarthur Rossetti, MD;  Location: [facility];  Service: Orthopedics;  Laterality: Left;  Marland Kitchen VASECTOMY     Social History   Occupational History  . Not on file  Tobacco Use  . Smoking status: Former Smoker    Packs/day: 0.50    Years: 30.00    Pack years: 15.00    Types: Cigarettes    Quit date: 01/15/2010    Years since quitting: 8.6  . Smokeless tobacco: Never Used  Substance and Sexual Activity  . Alcohol use: Yes    Comment: occ  . Drug use: No  . Sexual  activity: Yes    Partners: Female

## 2018-12-08 ENCOUNTER — Telehealth: Payer: Self-pay | Admitting: Family Medicine

## 2018-12-08 NOTE — Telephone Encounter (Signed)
Patient would like to Transfer his care to another provider He asked if he could be seen by K.Carlis Abbott  Would this transfer be okay ?

## 2018-12-08 NOTE — Telephone Encounter (Signed)
Overdue for f/u, needs OV and meds refilled.  Ok to transfer care if ok by Anda Kraft.

## 2018-12-08 NOTE — Telephone Encounter (Signed)
Lvm asking patient to call office. °

## 2018-12-08 NOTE — Telephone Encounter (Signed)
Please notify patient that I am happy to accept him but I do not prescribe medications like Xanax. If okay with that then needs office visit for FU or CPE.

## 2018-12-22 ENCOUNTER — Other Ambulatory Visit: Payer: Self-pay

## 2018-12-22 ENCOUNTER — Ambulatory Visit: Payer: BC Managed Care – PPO | Admitting: Family Medicine

## 2018-12-22 ENCOUNTER — Encounter: Payer: Self-pay | Admitting: Family Medicine

## 2018-12-22 VITALS — BP 132/70 | HR 85 | Temp 97.8°F | Ht 69.5 in | Wt 229.3 lb

## 2018-12-22 DIAGNOSIS — M8949 Other hypertrophic osteoarthropathy, multiple sites: Secondary | ICD-10-CM

## 2018-12-22 DIAGNOSIS — F5104 Psychophysiologic insomnia: Secondary | ICD-10-CM | POA: Diagnosis not present

## 2018-12-22 DIAGNOSIS — E785 Hyperlipidemia, unspecified: Secondary | ICD-10-CM | POA: Diagnosis not present

## 2018-12-22 DIAGNOSIS — M159 Polyosteoarthritis, unspecified: Secondary | ICD-10-CM

## 2018-12-22 DIAGNOSIS — I10 Essential (primary) hypertension: Secondary | ICD-10-CM

## 2018-12-22 DIAGNOSIS — Z125 Encounter for screening for malignant neoplasm of prostate: Secondary | ICD-10-CM

## 2018-12-22 LAB — LIPID PANEL
Cholesterol: 245 mg/dL — ABNORMAL HIGH (ref 0–200)
HDL: 65.6 mg/dL (ref >39.00)
LDL Cholesterol: 142 mg/dL — ABNORMAL HIGH (ref 0–99)
NonHDL: 179.44
Total CHOL/HDL Ratio: 4
Triglycerides: 188 mg/dL — ABNORMAL HIGH (ref 0.0–149.0)
VLDL: 37.6 mg/dL (ref 0.0–40.0)

## 2018-12-22 LAB — COMPREHENSIVE METABOLIC PANEL
ALT: 34 U/L (ref 0–53)
AST: 22 U/L (ref 0–37)
Albumin: 4.8 g/dL (ref 3.5–5.2)
Alkaline Phosphatase: 41 U/L (ref 39–117)
BUN: 19 mg/dL (ref 6–23)
CO2: 24 mEq/L (ref 19–32)
Calcium: 9.6 mg/dL (ref 8.4–10.5)
Chloride: 100 mEq/L (ref 96–112)
Creatinine, Ser: 1.06 mg/dL (ref 0.40–1.50)
GFR: 70.74 mL/min (ref >60.00)
Glucose, Bld: 112 mg/dL — ABNORMAL HIGH (ref 70–99)
Potassium: 4.7 mEq/L (ref 3.5–5.1)
Sodium: 135 mEq/L (ref 135–145)
Total Bilirubin: 0.5 mg/dL (ref 0.2–1.2)
Total Protein: 7.5 g/dL (ref 6.0–8.3)

## 2018-12-22 LAB — PSA: PSA: 1.11 ng/mL (ref 0.10–4.00)

## 2018-12-22 MED ORDER — LISINOPRIL 30 MG PO TABS: 30.0000 mg | ORAL_TABLET | Freq: Every day | ORAL | 3 refills | Status: DC

## 2018-12-22 MED ORDER — ROSUVASTATIN CALCIUM 20 MG PO TABS: 20.0000 mg | ORAL_TABLET | Freq: Every day | ORAL | 3 refills | Status: DC

## 2018-12-22 MED ORDER — HYDROCHLOROTHIAZIDE 25 MG PO TABS: 25.0000 mg | ORAL_TABLET | Freq: Every day | ORAL | 3 refills | Status: DC

## 2018-12-22 MED ORDER — HYDROXYZINE HCL 25 MG PO TABS: 25.0000 mg | ORAL_TABLET | Freq: Every day | ORAL | 1 refills | Status: DC

## 2018-12-22 NOTE — Assessment & Plan Note (Signed)
Chronic, continues crestor 20mg  daily - update labs.  The 10-year ASCVD risk score Mikey Bussing DC Brooke Bonito., et al., 2013) is: 11.2%   Values used to calculate the score:     Age: 63 years     Sex: Male     Is Non-Hispanic African American: No     Diabetic: No     Tobacco smoker: No     Systolic Blood Pressure: Q000111Q mmHg     Is BP treated: Yes     HDL Cholesterol: 63.7 mg/dL     Total Cholesterol: 215 mg/dL

## 2018-12-22 NOTE — Assessment & Plan Note (Signed)
On rare celebrex, tylenol.

## 2018-12-22 NOTE — Assessment & Plan Note (Signed)
Longstanding issue.  Desires to try hydroxyzine in place of xanax - sent in.  Declines OSA evaluation.

## 2018-12-22 NOTE — Patient Instructions (Addendum)
Labs today Medicines refilled Trial hydroxyzine sent to pharmacy for sleep Return as needed or in 1 year for physical.

## 2018-12-22 NOTE — Progress Notes (Signed)
This visit was conducted in person.  BP 132/70 (BP Location: Left Arm, Patient Position: Sitting, Cuff Size: Large)   Pulse 85   Temp 97.8 F (36.6 C) (Temporal)   Ht 5' 9.5" (1.765 m)   Wt 229 lb 5 oz (104 kg)   SpO2 95%   BMI 33.38 kg/m    CC: med refill visit Subjective:    Patient ID: Eric Delgado, male    DOB: [birth year], 63 y.o.   MRN: [id number]  HPI: Eric Delgado is a 63 y.o. male presenting on 12/22/2018 for Follow-up (Here for med f/u.  Wants to stop alprazolam and try hydroxyzine. Pt requests lab work. )   Last seen here 09/2016.  Thinks he had Covid 02/2018.   He does receive yearly DOT physicals, last 11/20/2018.   OA - takes celebrex 200mg  about once a month - hand and hip arthritis.   Chronic sleep maintenance insomnia - has been on xanax 1/2 tab nightly for sleep chronically. Ongoing trouble with sleep maintenance insomnia. Wants to stop xanax, try hydroxyzine. Drinks coffee daily. Prior on prozac, zoloft, trazodone with poor response.   HLD - off crestor for the past several months.   HTN - compliant with hctz 25mg  and lisinopril 30mg  daily. Does check blood pressures at home: [id number] systolic. No low blood pressure readings or symptoms of dizziness/syncope. Denies HA, vision changes, CP/tightness, SOB, leg swelling.   Preventative: COLONOSCOPY Date: 10/2010 WNL, rpt 10 yrs (Dr[facility]) Prostate cancer screening - due Td 2009, 11/2018 Shingrix completed 2020 Flu shot yearly Seat belt use discussed  Sunscreen use discussed. No changing moles on skin.  Ex smoker - quit 2011. Second hand smoke at work.  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 Interim medical history since our last visit reviewed. Allergies and medications reviewed and updated. Outpatient Medications Prior to Visit  Medication Sig Dispense Refill  . celecoxib (CELEBREX) 200 MG capsule Take 1 capsule by mouth daily as needed.    . Multiple Vitamin (MULTIVITAMIN WITH MINERALS) TABS tablet Take 1 tablet by mouth every morning.    Marland Kitchen ALPRAZolam (XANAX) 0.5 MG tablet TAKE 1 TABLET BY MOUTH EVERY NIGHT AT BEDTIME AS NEEDED. 30 tablet 0  . amoxicillin (AMOXIL) 500 MG capsule Take 500 mg by mouth 3 (three) times daily. Takes 4 tablets at once prior to dental visits    . hydrochlorothiazide (HYDRODIURIL) 25 MG tablet TAKE 1 TABLET BY MOUTH DAILY 90 tablet 0  . lisinopril (PRINIVIL,ZESTRIL) 30 MG tablet TAKE 1 TABLET BY MOUTH DAILY 90 tablet 0  . rosuvastatin (CRESTOR) 40 MG tablet TAKE ONE TABLET BY MOUTH EVERY NIGHT AT BEDTIME (Patient taking differently: Takes 1/2 tablet daily) 90 tablet 0  . aspirin 81 MG tablet Take 81 mg by mouth daily.    . Coenzyme Q10 (CO Q 10 PO) Take 1 tablet by mouth every morning.     No facility-administered medications prior to visit.      Per HPI unless specifically indicated in ROS section below Review of Systems Objective:    BP 132/70 (BP Location: Left Arm, Patient Position: Sitting, Cuff Size: Large)   Pulse 85   Temp 97.8 F (  36.6 C) (Temporal)   Ht 5' 9.5" (1.765 m)   Wt 229 lb 5 oz (104 kg)   SpO2 95%   BMI 33.38 kg/m   Wt Readings from Last 3 Encounters:  12/22/18 229 lb 5 oz (104 kg)  09/24/16 218 lb (98.9 kg)  07/30/16 217 lb 8 oz (98.7 kg)    Physical Exam Vitals signs and nursing note reviewed.  Constitutional:      General: He is not in acute distress.    Appearance: Normal appearance. He is obese. He is not ill-appearing.  HENT:     Mouth/Throat:     Mouth: Mucous membranes are moist.     Pharynx: Oropharynx is clear. No posterior oropharyngeal erythema.  Eyes:     Extraocular  Movements: Extraocular movements intact.     Pupils: Pupils are equal, round, and reactive to light.  Neck:     Thyroid: No thyromegaly or thyroid tenderness.  Cardiovascular:     Rate and Rhythm: Normal rate and regular rhythm.     Pulses: Normal pulses.     Heart sounds: Normal heart sounds. No murmur.  Pulmonary:     Effort: Pulmonary effort is normal. No respiratory distress.     Breath sounds: Normal breath sounds. No wheezing, rhonchi or rales.  Musculoskeletal:     Right lower leg: No edema.     Left lower leg: No edema.  Skin:    General: Skin is warm.     Findings: No rash.  Neurological:     Mental Status: He is alert.  Psychiatric:        Mood and Affect: Mood normal.        Behavior: Behavior normal.       Results for orders placed or performed in visit on 07/16/16  Lipid panel  Result Value Ref Range   Cholesterol 215 (H) 0 - 200 mg/dL   Triglycerides 112.0 0.0 - 149.0 mg/dL   HDL 63.70 >39.00 mg/dL   VLDL 22.4 0.0 - 40.0 mg/dL   LDL Cholesterol 129 (H) 0 - 99 mg/dL   Total CHOL/HDL Ratio 3    NonHDL 151.47   Comprehensive metabolic panel  Result Value Ref Range   Sodium 137 135 - 145 mEq/L   Potassium 4.6 3.5 - 5.1 mEq/L   Chloride 103 96 - 112 mEq/L   CO2 27 19 - 32 mEq/L   Glucose, Bld 113 (H) 70 - 99 mg/dL   BUN 16 6 - 23 mg/dL   Creatinine, Ser 1.01 0.40 - 1.50 mg/dL   Total Bilirubin 0.4 0.2 - 1.2 mg/dL   Alkaline Phosphatase 34 (L) 39 - 117 U/L   AST 21 0 - 37 U/L   ALT 29 0 - 53 U/L   Total Protein 7.2 6.0 - 8.3 g/dL   Albumin 4.6 3.5 - 5.2 g/dL   Calcium 9.5 8.4 - 10.5 mg/dL   GFR 80.15 >60.00 mL/min  PSA  Result Value Ref Range   PSA 0.83 0.10 - 4.00 ng/mL   Assessment & Plan:   Problem List Items Addressed This Visit    Osteoarthritis, multiple sites    On rare celebrex, tylenol.       Relevant Medications   celecoxib (CELEBREX) 200 MG capsule   HLD (hyperlipidemia) - Primary    Chronic, continues crestor 20mg  daily - update  labs.  The 10-year ASCVD risk score Mikey Bussing DC Jr., et al., 2013) is: 11.2%   Values used to calculate the score:  Age: 6 years     Sex: Male     Is Non-Hispanic African American: No     Diabetic: No     Tobacco smoker: No     Systolic Blood Pressure: Q000111Q mmHg     Is BP treated: Yes     HDL Cholesterol: 63.7 mg/dL     Total Cholesterol: 215 mg/dL       Relevant Medications   hydrochlorothiazide (HYDRODIURIL) 25 MG tablet   lisinopril (ZESTRIL) 30 MG tablet   rosuvastatin (CRESTOR) 20 MG tablet   Other Relevant Orders   Lipid panel   Comprehensive metabolic panel   Essential hypertension    Chronic, stable. Continue current regimen. Update labs.       Relevant Medications   hydrochlorothiazide (HYDRODIURIL) 25 MG tablet   lisinopril (ZESTRIL) 30 MG tablet   rosuvastatin (CRESTOR) 20 MG tablet   Chronic insomnia    Longstanding issue.  Desires to try hydroxyzine in place of xanax - sent in.  Declines OSA evaluation.        Other Visit Diagnoses    Special screening for malignant neoplasm of prostate       Relevant Orders   PSA       Meds ordered this encounter  Medications  . hydrochlorothiazide (HYDRODIURIL) 25 MG tablet    Sig: Take 1 tablet (25 mg total) by mouth daily.    Dispense:  90 tablet    Refill:  3  . lisinopril (ZESTRIL) 30 MG tablet    Sig: Take 1 tablet (30 mg total) by mouth daily.    Dispense:  90 tablet    Refill:  3  . rosuvastatin (CRESTOR) 20 MG tablet    Sig: Take 1 tablet (20 mg total) by mouth at bedtime.    Dispense:  90 tablet    Refill:  3  . hydrOXYzine (ATARAX/VISTARIL) 25 MG tablet    Sig: Take 1 tablet (25 mg total) by mouth at bedtime.    Dispense:  90 tablet    Refill:  1   Orders Placed This Encounter  Procedures  . Lipid panel  . Comprehensive metabolic panel  . PSA    Patient Instructions  Labs today Medicines refilled Trial hydroxyzine sent to pharmacy for sleep Return as needed or in 1 year for physical.     Follow up plan: Return in about 1 year (around 12/22/2019) for annual exam, prior fasting for blood work.  Ria Bush, MD

## 2018-12-22 NOTE — Assessment & Plan Note (Signed)
Chronic, stable. Continue current regimen. Update labs.

## 2019-03-23 DIAGNOSIS — L814 Other melanin hyperpigmentation: Secondary | ICD-10-CM | POA: Diagnosis not present

## 2019-03-23 DIAGNOSIS — L820 Inflamed seborrheic keratosis: Secondary | ICD-10-CM | POA: Diagnosis not present

## 2019-03-23 DIAGNOSIS — L821 Other seborrheic keratosis: Secondary | ICD-10-CM | POA: Diagnosis not present

## 2019-03-23 DIAGNOSIS — L570 Actinic keratosis: Secondary | ICD-10-CM | POA: Diagnosis not present

## 2019-03-23 DIAGNOSIS — D485 Neoplasm of uncertain behavior of skin: Secondary | ICD-10-CM | POA: Diagnosis not present

## 2019-06-27 ENCOUNTER — Telehealth: Payer: Self-pay

## 2019-06-27 MED ORDER — TRAZODONE HCL 50 MG PO TABS: 25.0000 mg | ORAL_TABLET | Freq: Every evening | ORAL | 6 refills | Status: DC | PRN

## 2019-06-27 NOTE — Telephone Encounter (Signed)
Pt left v/m requesting refill of med; did not leave name of med and also pt said he is having trouble breathing and concerned might have pneumonia. Left v/m for pt to cb.

## 2019-06-27 NOTE — Telephone Encounter (Signed)
Pt is trying to get refill for trazodone to help pt sleep since pt not taking xanax; pt is a better mood the next day after taking the trazodone. [facility]. Last filled # 30 x 3 on 03/21/2015.. The OTC PM sleep med is not helping pt to go to sleep.; pt has been off xanax for couple of months. Pt said he is congested in lungs since 06/21/19.; prod cough with yellow-green phlegm and pt is wheezing at night.  No fever. Did have chills few nights ago but no chills since then. Pt has had stabbing H/A like ice cream head freeze with pain level ? Pt is not sure how to number pain level for H/A. pt took amoxicillin 2000 mg on 06/22/19 but it has not helped pts symptoms. Abd pain and watery diarrhea on 06/25/19 but no diarrhea since then. No loss of taste or smell. No S/T or runny nose. No other covid symptoms. Pt request cb about refill of trazodone and pt is going to [facility] for SOB and chest congestion. I called [facility] in [place] and they will not have xray capability until probably some time in July 2021.

## 2019-06-27 NOTE — Telephone Encounter (Signed)
Spoke with pt relaying Dr. Synthia Innocent message.  Pt verbalizes understanding and says he will try to go some time today.

## 2019-06-27 NOTE — Addendum Note (Signed)
Addended by: Helene Shoe on: 06/27/2019 02:53 PM   Modules accepted: Orders

## 2019-06-27 NOTE — Addendum Note (Signed)
Addended by: Ria Bush on: 06/27/2019 02:58 PM   Modules accepted: Orders

## 2019-06-27 NOTE — Telephone Encounter (Signed)
Noted  

## 2019-06-27 NOTE — Telephone Encounter (Signed)
Agree with [facility] eval for trouble breathing and cough. Hope he gets to feeling better soon. I've refilled trazodone for him.

## 2019-07-22 ENCOUNTER — Other Ambulatory Visit: Payer: Self-pay | Admitting: Family Medicine

## 2019-08-07 ENCOUNTER — Other Ambulatory Visit: Payer: Self-pay

## 2019-08-07 ENCOUNTER — Ambulatory Visit (INDEPENDENT_AMBULATORY_CARE_PROVIDER_SITE_OTHER): Payer: BC Managed Care – PPO | Admitting: Orthopaedic Surgery

## 2019-08-07 ENCOUNTER — Encounter: Payer: Self-pay | Admitting: Orthopaedic Surgery

## 2019-08-07 DIAGNOSIS — G8929 Other chronic pain: Secondary | ICD-10-CM

## 2019-08-07 DIAGNOSIS — M25562 Pain in left knee: Secondary | ICD-10-CM | POA: Diagnosis not present

## 2019-08-07 MED ORDER — DIAZEPAM 5 MG PO TABS: 5.0000 mg | ORAL_TABLET | Freq: Once | ORAL | 0 refills | Status: AC

## 2019-08-07 NOTE — Progress Notes (Addendum)
Office Visit Note   Patient: Eric Delgado           Date of Birth: Nov 03, 1955           MRN: [id number] Visit Date: 08/07/2019              Requested by: Ria Bush, MD [address] [place],  [place] [address] PCP: Ria Bush, MD   Assessment & Plan: Visit Diagnoses:  1. Chronic pain of left knee     Plan: My impression is continued left knee pain.  At this point we will need to obtain MRI to rule out structural abnormalities.  We will schedule an open MRI for him due to claustrophobia.  Valium was prescribed.  Patient stated that he made an appointment with me today because Dr. Ninfa Linden was not available and so I recommend that he follow-up with Dr. Ninfa Linden after the MRI if his intent is to ultimately have Dr. Ninfa Linden operate on him if surgery is necessary.  Patient was very pleased with his outcome from his hip replacement that Dr. Ninfa Linden did 4 years ago.  Follow-Up Instructions: Return if symptoms worsen or fail to improve.   Orders:  No orders of the defined types were placed in this encounter.  Meds ordered this encounter  Medications  . diazepam (VALIUM) 5 MG tablet    Sig: Take 1-2 tablets (5-10 mg total) by mouth once for 1 dose.    Dispense:  2 tablet    Refill:  0      Procedures: No procedures performed   Clinical Data: No additional findings.   Subjective: Chief Complaint  Patient presents with  . Left Knee - Pain    Mr. Castille follows up today for chronic left knee pain.  He saw Dr. Marlou Sa a year ago for this and received a cortisone injection which helped for about a week.  He states that he tore his hamstring about 6 months ago and feels bunched up hamstring and posterior thigh.  He states that hurts to bend.  His pain is posteriorly in the popliteal fossa.   Review of Systems  Constitutional: Negative.   All other systems reviewed and are negative.    Objective: Vital Signs: There were no vitals taken for this  visit.  Physical Exam Vitals and nursing note reviewed.  Constitutional:      Appearance: He is well-developed.  Pulmonary:     Effort: Pulmonary effort is normal.  Abdominal:     Palpations: Abdomen is soft.  Skin:    General: Skin is warm.  Neurological:     Mental Status: He is alert and oriented to person, place, and time.  Psychiatric:        Behavior: Behavior normal.        Thought Content: Thought content normal.        Judgment: Judgment normal.     Ortho Exam Left knee shows no significant effusion.  Tenderness throughout the popliteal fossa.  No masses.  He does have a deformity of the hamstring muscle belly that is bunched up compared to the contralateral side.  He has no joint line tenderness. Specialty Comments:  No specialty comments available.  Imaging: No results found.   PMFS History: Patient Active Problem List   Diagnosis Date Noted  . Obesity, Class I, BMI 30-34.9 07/31/2016  . Chronic insomnia 07/31/2016  . Oral mucosal lesion 03/01/2014  . Osteoarthritis, multiple sites 12/21/2013  . Status post total replacement of  left hip 12/21/2013  . Routine health maintenance 10/16/2010  . Ex-smoker 10/16/2010  . HLD (hyperlipidemia) 12/17/2006  . Irritability 12/17/2006  . Essential hypertension 12/17/2006  . Carpal tunnel syndrome 12/17/2006   Past Medical History:  Diagnosis Date  . Arthritis   . Difficulty sleeping    takes xanax for sleep  . Hyperlipidemia   . Hypertension   . SCIATICA, RIGHT 10/12/2007   Qualifier: Diagnosis of  By: Linda Hedges MD, Heinz Knuckles     Family History  Problem Relation Age of Onset  . Macular degeneration Mother   . Hypertension Mother   . Hyperlipidemia Mother   . Cancer Mother 74       lung (small cell), smoker   . Stroke Maternal Aunt   . Diabetes Neg Hx   . CAD Neg Hx     Past Surgical History:  Procedure Laterality Date  . APPENDECTOMY  1975  . CATARACT EXTRACTION Bilateral 02/2015   [facility]  .  COLONOSCOPY  10/2010   WNL, rpt 10 yrs (Dr. Glenn[facility])  . ROTATOR CUFF REPAIR     left  . TOTAL HIP ARTHROPLASTY Right Spring '12   Dr. Alvan Dame - ceramic ball, fiber stem  . TOTAL HIP ARTHROPLASTY Left 12/21/2013   Procedure: LEFT TOTAL HIP ARTHROPLASTY ANTERIOR APPROACH;  Surgeon: Mcarthur Rossetti, MD;  Location: [facility];  Service: Orthopedics;  Laterality: Left;  Marland Kitchen VASECTOMY     Social History   Occupational History  . Not on file  Tobacco Use  . Smoking status: Former Smoker    Packs/day: 0.50    Years: 30.00    Pack years: 15.00    Types: Cigarettes    Quit date: 01/15/2010    Years since quitting: 9.5  . Smokeless tobacco: Never Used  Vaping Use  . Vaping Use: Never used  Substance and Sexual Activity  . Alcohol use: Yes    Comment: occ  . Drug use: No  . Sexual activity: Yes    Partners: Female

## 2019-09-14 ENCOUNTER — Other Ambulatory Visit: Payer: BC Managed Care – PPO

## 2019-09-18 ENCOUNTER — Telehealth: Payer: Self-pay | Admitting: Orthopaedic Surgery

## 2019-09-18 NOTE — Telephone Encounter (Signed)
Pt called stating he would like to get set up for an MRI on his R knee; pt would like a CB to discuss further.  [phone]

## 2019-09-18 NOTE — Telephone Encounter (Signed)
FYI Looks like this a [facility] patient. I left a message that if this is for the right knee, we probably need to see him since we usually just see him for his left knee Asked that he call back and confirm

## 2019-11-09 ENCOUNTER — Other Ambulatory Visit: Payer: Self-pay

## 2019-11-09 ENCOUNTER — Ambulatory Visit
Admission: RE | Admit: 2019-11-09 | Discharge: 2019-11-09 | Disposition: A | Discharge status: Home / Self Care | Payer: BC Managed Care – PPO | Source: Ambulatory Visit | Attending: Orthopaedic Surgery | Admitting: Orthopaedic Surgery

## 2019-11-09 DIAGNOSIS — G8929 Other chronic pain: Secondary | ICD-10-CM

## 2019-11-09 DIAGNOSIS — M25562 Pain in left knee: Secondary | ICD-10-CM

## 2019-11-09 NOTE — Progress Notes (Signed)
See report

## 2019-11-14 ENCOUNTER — Encounter: Payer: Self-pay | Admitting: Orthopaedic Surgery

## 2019-11-14 ENCOUNTER — Ambulatory Visit: Payer: BC Managed Care – PPO | Admitting: Orthopaedic Surgery

## 2019-11-14 ENCOUNTER — Telehealth: Payer: Self-pay

## 2019-11-14 DIAGNOSIS — G8929 Other chronic pain: Secondary | ICD-10-CM | POA: Diagnosis not present

## 2019-11-14 DIAGNOSIS — M25562 Pain in left knee: Secondary | ICD-10-CM | POA: Diagnosis not present

## 2019-11-14 DIAGNOSIS — S83242D Other tear of medial meniscus, current injury, left knee, subsequent encounter: Secondary | ICD-10-CM | POA: Diagnosis not present

## 2019-11-14 MED ORDER — LISINOPRIL 40 MG PO TABS: 40.0000 mg | ORAL_TABLET | Freq: Every day | ORAL | 3 refills | Status: DC

## 2019-11-14 MED ORDER — AMLODIPINE BESYLATE 5 MG PO TABS
5.0000 mg | ORAL_TABLET | Freq: Every day | ORAL | 3 refills | Status: DC
Start: 2019-11-14 — End: 2019-11-19

## 2019-11-14 NOTE — Telephone Encounter (Signed)
Recommend we increase lisinopril to 40mg  once daily, continue HCTZ 25mg  daily. Don't recommend more than 40mg  lisinopril once daily.  Will add amlodipine 5mg  daily - sent to pharmacy. Watch for ankle swelling on this medicine.  Keep appt next week.

## 2019-11-14 NOTE — Progress Notes (Signed)
The patient comes in today to go over a MRI of his left knee.  He has been having a lot of pain in the popliteal area of his knee when he gets down on his knees.  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  Have actually placed his right hip before.  This is been going on for some time now with his left knee.  Is kind words definitely affecting his mobility, his quality of life and his actives daily living.  When my partner saw him and sent her for an MRI of his left knee he is here for review this today.  He does have a chronic hamstring injury on that side and he can see the muscle in the left hamstring area is kind of bunched up in the mid hamstring area.  His pain is in the popliteal area though.  He has had a hard time with his knee over the last year since this injury happened.  Examination of his left knee does show mild effusion.  He is a positive McMurray sign the medial compartment of the knee.  He has aching with hyperflexion of the left knee.  MRI is reviewed with him and it does show mucoid degeneration of the ACL with a ganglion cyst in the intercondylar area of the knee.  There is also a complex posterior horn mid body medial meniscal tear.  There is only thinning of the cartilage in the knee with no full-thickness cartilage defect.  At this point I am recommending arthroscopic intervention based on his clinical exam and MRI findings and the failure of conservative treatment.  I showed him a knee model explained in detail what the surgery would involve.  I talked about the risk and benefits of surgery in detail.  All questions and concerns were answered and addressed.  We will work on getting this scheduled and we will see him back at 1 week postoperative.  I would have him out of work depending on his discomfort and his ability get back to heavy manual labor which could be as little as a week to 4 to 6 weeks.

## 2019-11-14 NOTE — Telephone Encounter (Signed)
Spoke with pt relaying Dr. G's message. Pt verbalizes understanding.  

## 2019-11-14 NOTE — Telephone Encounter (Signed)
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 Last night pt took extra Lisinopril 30 mg and again this morning pt took Lisinopril 30 mg and HCTZ 25 mg. At DOT exam BP 166/90 and BP repeated was 166/90. 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 Pt has not had H/A,dizziness, CP or vision changes. Pt did not think he was anxious or nervous at DOT appt. Pt scheduled appt with Dr Darnell Level on 11/19/19 at 11:30 but that will be too late to increase BP med dosage or start new med because on 11/88/67 pts DOT license expire and pt has to have the license to work. Pt wants to know if DR G can increase or change his BP med so that his bp will be below 140/90 when he retests on Monday.[facility]; pt request cb after Dr Darnell Level reviews the note.

## 2019-11-19 ENCOUNTER — Other Ambulatory Visit: Payer: Self-pay

## 2019-11-19 ENCOUNTER — Encounter: Payer: Self-pay | Admitting: Family Medicine

## 2019-11-19 ENCOUNTER — Ambulatory Visit (INDEPENDENT_AMBULATORY_CARE_PROVIDER_SITE_OTHER): Payer: BC Managed Care – PPO | Admitting: Family Medicine

## 2019-11-19 VITALS — BP 132/72 | HR 74 | Temp 97.8°F | Ht 69.5 in | Wt 215.6 lb

## 2019-11-19 DIAGNOSIS — F5104 Psychophysiologic insomnia: Secondary | ICD-10-CM

## 2019-11-19 DIAGNOSIS — Z87891 Personal history of nicotine dependence: Secondary | ICD-10-CM

## 2019-11-19 DIAGNOSIS — I10 Essential (primary) hypertension: Secondary | ICD-10-CM | POA: Diagnosis not present

## 2019-11-19 DIAGNOSIS — Z1211 Encounter for screening for malignant neoplasm of colon: Secondary | ICD-10-CM

## 2019-11-19 DIAGNOSIS — E669 Obesity, unspecified: Secondary | ICD-10-CM | POA: Diagnosis not present

## 2019-11-19 MED ORDER — AMLODIPINE BESYLATE 5 MG PO TABS: 5.0000 mg | ORAL_TABLET | Freq: Every day | ORAL | 3 refills | Status: DC

## 2019-11-19 MED ORDER — HYDROCHLOROTHIAZIDE 25 MG PO TABS: 25.0000 mg | ORAL_TABLET | Freq: Every day | ORAL | 3 refills | Status: DC

## 2019-11-19 MED ORDER — LISINOPRIL 40 MG PO TABS: 40.0000 mg | ORAL_TABLET | Freq: Every day | ORAL | 3 refills | Status: DC

## 2019-11-19 MED ORDER — ROSUVASTATIN CALCIUM 20 MG PO TABS: 20.0000 mg | ORAL_TABLET | Freq: Every day | ORAL | 3 refills | Status: DC

## 2019-11-19 NOTE — Patient Instructions (Addendum)
Update EKG today  We will refer you to lung cancer screening CT program - in [place].  I'm glad blood pressures are doing better on current regimen - continue current medicines.  Return in 1 month for fasting labs.  Return at your convenience for physical.       Mediterranean Diet  Why follow it? Research shows. . Those who follow the Mediterranean diet have a reduced risk of heart disease  . The diet is associated with a reduced incidence of Parkinson's and Alzheimer's diseases . People following the diet may have longer life expectancies and lower rates of chronic diseases  . The Dietary Guidelines for Americans recommends the Mediterranean diet as an eating plan to promote health and prevent disease  What Is the Mediterranean Diet?  . Healthy eating plan based on typical foods and recipes of Mediterranean-style cooking . The diet is primarily a plant based diet; these foods should make up a majority of meals   Starches - Plant based foods should make up a majority of meals - They are an important sources of vitamins, minerals, energy, antioxidants, and fiber - Choose whole grains, foods high in fiber and minimally processed items  - Typical grain sources include wheat, oats, barley, corn, brown rice, bulgar, farro, millet, polenta, couscous  - Various types of beans include chickpeas, lentils, fava beans, black beans, white beans   Fruits  Veggies - Large quantities of antioxidant rich fruits & veggies; 6 or more servings  - Vegetables can be eaten raw or lightly drizzled with oil and cooked  - Vegetables common to the traditional Mediterranean Diet include: artichokes, arugula, beets, broccoli, brussel sprouts, cabbage, carrots, celery, collard greens, cucumbers, eggplant, kale, leeks, lemons, lettuce, mushrooms, okra, onions, peas, peppers, potatoes, pumpkin, radishes, rutabaga, shallots, spinach, sweet potatoes, turnips, zucchini - Fruits common to the Mediterranean Diet include:  apples, apricots, avocados, cherries, clementines, dates, figs, grapefruits, grapes, melons, nectarines, oranges, peaches, pears, pomegranates, strawberries, tangerines  Fats - Replace butter and margarine with healthy oils, such as olive oil, canola oil, and tahini  - Limit nuts to no more than a handful a day  - Nuts include walnuts, almonds, pecans, pistachios, pine nuts  - Limit or avoid candied, honey roasted or heavily salted nuts - Olives are central to the [employer] - can be eaten whole or used in a variety of dishes   Meats Protein - Limiting red meat: no more than a few times a month - When eating red meat: choose lean cuts and keep the portion to the size of deck of cards - Eggs: approx. 0 to 4 times a week  - Fish and lean poultry: at least 2 a week  - Healthy protein sources include, chicken, [place], lean beef, lamb - Increase intake of seafood such as tuna, salmon, trout, mackerel, shrimp, scallops - Avoid or limit high fat processed meats such as sausage and bacon  Dairy - Include moderate amounts of low fat dairy products  - Focus on healthy dairy such as fat free yogurt, skim milk, low or reduced fat cheese - Limit dairy products higher in fat such as whole or 2% milk, cheese, ice cream  Alcohol - Moderate amounts of red wine is ok  - No more than 5 oz daily for women (all ages) and men older than age 11  - No more than 10 oz of wine daily for men younger than 39  Other - Limit sweets and other desserts  - Use herbs  and spices instead of salt to flavor foods  - Herbs and spices common to the traditional Mediterranean Diet include: basil, bay leaves, chives, cloves, cumin, fennel, garlic, lavender, marjoram, mint, oregano, parsley, pepper, rosemary, sage, savory, sumac, tarragon, thyme   It's not just a diet, it's a lifestyle:  . The Mediterranean diet includes lifestyle factors typical of those in the region  . Foods, drinks and meals are best eaten with others and  savored . Daily physical activity is important for overall good health . This could be strenuous exercise like running and aerobics . This could also be more leisurely activities such as walking, housework, yard-work, or taking the stairs . Moderation is the key; a balanced and healthy diet accommodates most foods and drinks . Consider portion sizes and frequency of consumption of certain foods   Meal Ideas & Options:  . Breakfast:  o Whole wheat toast or whole wheat English muffins with peanut butter & hard boiled egg o Steel cut oats topped with apples & cinnamon and skim milk  o Fresh fruit: banana, strawberries, melon, berries, peaches  o Smoothies: strawberries, bananas, greek yogurt, peanut butter o Low fat greek yogurt with blueberries and granola  o Egg white omelet with spinach and mushrooms o Breakfast couscous: whole wheat couscous, apricots, skim milk, cranberries  . Sandwiches:  o Hummus and grilled vegetables (peppers, zucchini, squash) on whole wheat bread   o Grilled chicken on whole wheat pita with lettuce, tomatoes, cucumbers or tzatziki  o Tuna salad on whole wheat bread: tuna salad made with greek yogurt, olives, red peppers, capers, green onions o Garlic rosemary lamb pita: lamb sauted with garlic, rosemary, salt & pepper; add lettuce, cucumber, greek yogurt to pita - flavor with lemon juice and black pepper  . Seafood:  o Mediterranean grilled salmon, seasoned with garlic, basil, parsley, lemon juice and black pepper o Shrimp, lemon, and spinach whole-grain pasta salad made with low fat greek yogurt  o Seared scallops with lemon orzo  o Seared tuna steaks seasoned salt, pepper, coriander topped with tomato mixture of olives, tomatoes, olive oil, minced garlic, parsley, green onions and cappers  . Meats:  o Herbed greek chicken salad with kalamata olives, cucumber, feta  o Red bell peppers stuffed with spinach, bulgur, lean ground beef (or lentils) & topped with feta    o Kebabs: skewers of chicken, tomatoes, onions, zucchini, squash  o [place] burgers: made with red onions, mint, dill, lemon juice, feta cheese topped with roasted red peppers . Vegetarian o Cucumber salad: cucumbers, artichoke hearts, celery, red onion, feta cheese, tossed in olive oil & lemon juice  o Hummus and whole grain pita points with a greek salad (lettuce, tomato, feta, olives, cucumbers, red onion) o Lentil soup with celery, carrots made with vegetable broth, garlic, salt and pepper  o Tabouli salad: parsley, bulgur, mint, scallions, cucumbers, tomato, radishes, lemon juice, olive oil, salt and pepper.

## 2019-11-19 NOTE — Assessment & Plan Note (Signed)
Chronic, recently deteriorated but now better controlled with recent med changes. Reviewed diet choices to improve blood pressure control. Recommended mediterranean diet. Forgot handout- will mail to patient.

## 2019-11-19 NOTE — Assessment & Plan Note (Signed)
Has been able to self wean off xanax, now using trazodone PRN sleep with good effect.

## 2019-11-19 NOTE — Progress Notes (Signed)
This visit was conducted in person.  BP 132/72 (BP Location: Left Arm, Patient Position: Sitting, Cuff Size: Large)   Pulse 74   Temp 97.8 F (36.6 C) (Temporal)   Ht 5' 9.5" (1.765 m)   Wt 215 lb 9 oz (97.8 kg)   SpO2 96%   BMI 31.38 kg/m   BP Readings from Last 3 Encounters:  11/19/19 132/72  12/22/18 132/70  09/24/16 138/80    CC: HTN Subjective:    Patient ID: Domingo Mend, male    DOB: 10-05-1955, 64 y.o.   MRN: [id number]  HPI: DRAGO HAMMONDS is a 64 y.o. male presenting on 11/19/2019 for Hypertension (C/o higher than normal BP.  Recently increased lisinopril and added amlodipine.  Just left DOT PE, BP was 138/78)   See recent phone note for details. BP found to be elevated at recent DOT exam to 166/90 - he increased lisinopril to 30mg  bid without much benefit. We increased lisinopril to 40mg , started amlodipine 5mg . Rpt DOT BP check was down to 138/79. Urinalysis normal at that time.   HTN - Compliant with current antihypertensive regimen of amlodipine 5mg  daily, hctz 25mg  daily, lisinopril 40mg  daily. Does check blood pressures at home: still elevated. No low blood pressure readings or symptoms of dizziness/syncope. Denies HA, vision changes, CP/tightness, SOB, leg swelling.   15 lb weight loss since last seen here 12/2018 - limiting carbs.   Upcoming L knee surgery (Dr Rush Farmer) later this month.  Requests new referral to GI [facility]) for rpt colonoscopy. Denies blood in stool or urine.   Smoking - quit 2011. 1ppd for 30 yrs. Interested in lung cancer screening program.      Relevant past medical, surgical, family and social history reviewed and updated as indicated. Interim medical history since our last visit reviewed. Allergies and medications reviewed and updated. Outpatient Medications Prior to Visit  Medication Sig Dispense Refill  . Multiple Vitamin (MULTIVITAMIN WITH MINERALS) TABS tablet Take 1 tablet by mouth every morning.    . traZODone (DESYREL)  50 MG tablet TAKE 0.5-1 TABLETS (25-50 MG TOTAL) BY MOUTH AT BEDTIME AS NEEDED FOR SLEEP. 90 tablet 1  . amLODipine (NORVASC) 5 MG tablet Take 1 tablet (5 mg total) by mouth daily. 30 tablet 3  . hydrochlorothiazide (HYDRODIURIL) 25 MG tablet Take 1 tablet (25 mg total) by mouth daily. 90 tablet 3  . lisinopril (ZESTRIL) 40 MG tablet Take 1 tablet (40 mg total) by mouth daily. 30 tablet 3  . rosuvastatin (CRESTOR) 20 MG tablet Take 1 tablet (20 mg total) by mouth at bedtime. 90 tablet 3  . celecoxib (CELEBREX) 200 MG capsule Take 1 capsule by mouth daily as needed.    . hydrOXYzine (ATARAX/VISTARIL) 25 MG tablet Take 1 tablet (25 mg total) by mouth at bedtime. 90 tablet 1   No facility-administered medications prior to visit.     Per HPI unless specifically indicated in ROS section below Review of Systems Objective:  BP 132/72 (BP Location: Left Arm, Patient Position: Sitting, Cuff Size: Large)   Pulse 74   Temp 97.8 F (36.6 C) (Temporal)   Ht 5' 9.5" (1.765 m)   Wt 215 lb 9 oz (97.8 kg)   SpO2 96%   BMI 31.38 kg/m   Wt Readings from Last 3 Encounters:  11/19/19 215 lb 9 oz (97.8 kg)  12/22/18 229 lb 5 oz (104 kg)  09/24/16 218 lb (98.9 kg)      Physical Exam Vitals and nursing  note reviewed.  Constitutional:      Appearance: Normal appearance. He is not ill-appearing.  Cardiovascular:     Rate and Rhythm: Normal rate and regular rhythm.     Pulses: Normal pulses.     Heart sounds: Normal heart sounds. No murmur heard.   Pulmonary:     Effort: Pulmonary effort is normal. No respiratory distress.     Breath sounds: Normal breath sounds. No wheezing, rhonchi or rales.  Musculoskeletal:        General: Normal range of motion.     Right lower leg: No edema.     Left lower leg: No edema.  Neurological:     Mental Status: He is alert.  Psychiatric:        Mood and Affect: Mood normal.        Behavior: Behavior normal.       Results for orders placed or performed in  visit on 12/22/18  Lipid panel  Result Value Ref Range   Cholesterol 245 (H) 0 - 200 mg/dL   Triglycerides 188.0 (H) 0 - 149 mg/dL   HDL 65.60 >39.00 mg/dL   VLDL 37.6 0.0 - 40.0 mg/dL   LDL Cholesterol 142 (H) 0 - 99 mg/dL   Total CHOL/HDL Ratio 4    NonHDL 179.44   Comprehensive metabolic panel  Result Value Ref Range   Sodium 135 135 - 145 mEq/L   Potassium 4.7 3.5 - 5.1 mEq/L   Chloride 100 96 - 112 mEq/L   CO2 24 19 - 32 mEq/L   Glucose, Bld 112 (H) 70 - 99 mg/dL   BUN 19 6 - 23 mg/dL   Creatinine, Ser 1.06 0.40 - 1.50 mg/dL   Total Bilirubin 0.5 0.2 - 1.2 mg/dL   Alkaline Phosphatase 41 39 - 117 U/L   AST 22 0 - 37 U/L   ALT 34 0 - 53 U/L   Total Protein 7.5 6.0 - 8.3 g/dL   Albumin 4.8 3.5 - 5.2 g/dL   GFR 70.74 >60.00 mL/min   Calcium 9.6 8.4 - 10.5 mg/dL  PSA  Result Value Ref Range   PSA 1.11 0.10 - 4.00 ng/mL   EKG - NSR rate 60s, normal axis, intervals, no acute ST/T changes.  Assessment & Plan:  This visit occurred during the SARS-CoV-2 public health emergency.  Safety protocols were in place, including screening questions prior to the visit, additional usage of staff PPE, and extensive cleaning of exam room while observing appropriate contact time as indicated for disinfecting solutions.   Requests GI referral to Dr Watt Climes for colonoscopy which will be due next year.   Problem List Items Addressed This Visit    Obesity, Class I, BMI 30-34.9    Congratulated on weight loss noted - he plans to continue healthy diet choices. Requests info on mediterranean diet.       Ex-smoker    Reviewed lung cancer screening program - will refer.       Relevant Orders   Ambulatory Referral for Lung Cancer Scre   Essential hypertension - Primary    Chronic, recently deteriorated but now better controlled with recent med changes. Reviewed diet choices to improve blood pressure control. Recommended mediterranean diet. Forgot handout- will mail to patient.       Relevant  Medications   amLODipine (NORVASC) 5 MG tablet   lisinopril (ZESTRIL) 40 MG tablet   hydrochlorothiazide (HYDRODIURIL) 25 MG tablet   rosuvastatin (CRESTOR) 20 MG tablet   Other Relevant Orders  EKG 12-Lead (Completed)   Chronic insomnia    Has been able to self wean off xanax, now using trazodone PRN sleep with good effect.        Other Visit Diagnoses    Special screening for malignant neoplasms, colon       Relevant Orders   Ambulatory referral to Gastroenterology       Meds ordered this encounter  Medications  . amLODipine (NORVASC) 5 MG tablet    Sig: Take 1 tablet (5 mg total) by mouth daily.    Dispense:  90 tablet    Refill:  3  . lisinopril (ZESTRIL) 40 MG tablet    Sig: Take 1 tablet (40 mg total) by mouth daily.    Dispense:  90 tablet    Refill:  3  . hydrochlorothiazide (HYDRODIURIL) 25 MG tablet    Sig: Take 1 tablet (25 mg total) by mouth daily.    Dispense:  90 tablet    Refill:  3  . rosuvastatin (CRESTOR) 20 MG tablet    Sig: Take 1 tablet (20 mg total) by mouth at bedtime.    Dispense:  90 tablet    Refill:  3   Orders Placed This Encounter  Procedures  . Ambulatory Referral for Lung Cancer Scre    Referral Priority:   Routine    Referral Type:   Consultation    Referral Reason:   Specialty Services Required    Number of Visits Requested:   1  . Ambulatory referral to Gastroenterology    Referral Priority:   Routine    Referral Type:   Consultation    Referral Reason:   Specialty Services Required    Number of Visits Requested:   1  . EKG 12-Lead    Patient instructions: Update EKG today  We will refer you to lung cancer screening CT program - in [place].  I'm glad blood pressures are doing better on current regimen - continue current medicines.  Return in 1 month for fasting labs.  Return at your convenience for physical.   Follow up plan: Return in about 6 months (around 05/19/2020) for annual exam, prior fasting for blood  work.  Ria Bush, MD

## 2019-11-19 NOTE — Assessment & Plan Note (Signed)
Congratulated on weight loss noted - he plans to continue healthy diet choices. Requests info on mediterranean diet.

## 2019-11-19 NOTE — Assessment & Plan Note (Signed)
Reviewed lung cancer screening program - will refer.

## 2019-12-06 ENCOUNTER — Other Ambulatory Visit: Payer: Self-pay | Admitting: Orthopaedic Surgery

## 2019-12-06 DIAGNOSIS — X58XXXA Exposure to other specified factors, initial encounter: Secondary | ICD-10-CM | POA: Diagnosis not present

## 2019-12-06 DIAGNOSIS — M94262 Chondromalacia, left knee: Secondary | ICD-10-CM | POA: Diagnosis not present

## 2019-12-06 DIAGNOSIS — Y999 Unspecified external cause status: Secondary | ICD-10-CM | POA: Diagnosis not present

## 2019-12-06 DIAGNOSIS — G8918 Other acute postprocedural pain: Secondary | ICD-10-CM | POA: Diagnosis not present

## 2019-12-06 DIAGNOSIS — M23022 Cystic meniscus, posterior horn of medial meniscus, left knee: Secondary | ICD-10-CM | POA: Diagnosis not present

## 2019-12-06 DIAGNOSIS — S83232A Complex tear of medial meniscus, current injury, left knee, initial encounter: Secondary | ICD-10-CM | POA: Diagnosis not present

## 2019-12-06 DIAGNOSIS — M794 Hypertrophy of (infrapatellar) fat pad: Secondary | ICD-10-CM | POA: Diagnosis not present

## 2019-12-06 MED ORDER — HYDROCODONE-ACETAMINOPHEN 5-325 MG PO TABS: 1.0000 | ORAL_TABLET | Freq: Four times a day (QID) | ORAL | 0 refills | Status: DC | PRN

## 2019-12-12 ENCOUNTER — Ambulatory Visit (INDEPENDENT_AMBULATORY_CARE_PROVIDER_SITE_OTHER): Payer: BC Managed Care – PPO | Admitting: Orthopaedic Surgery

## 2019-12-12 ENCOUNTER — Encounter: Payer: Self-pay | Admitting: Orthopaedic Surgery

## 2019-12-12 ENCOUNTER — Inpatient Hospital Stay: Payer: BC Managed Care – PPO | Admitting: Orthopaedic Surgery

## 2019-12-12 DIAGNOSIS — Z9889 Other specified postprocedural states: Secondary | ICD-10-CM | POA: Insufficient documentation

## 2019-12-12 DIAGNOSIS — S83242D Other tear of medial meniscus, current injury, left knee, subsequent encounter: Secondary | ICD-10-CM

## 2019-12-12 NOTE — Progress Notes (Signed)
The patient is a little over a week out from a left knee arthroscopy with a partial medial meniscectomy.  We found between grade 2 and grade III chondromalacia of the medial femoral condyle but no full-thickness cartilage defects at all.  The rest of his knee look normal.  He has had swelling in his knee just recently.  The first few days after surgery he had no pain and no swelling at all.  Examination of his left knee does show moderate effusion.  I was able to aspirate about 30 cc of fluid from the knee that was bloody and then place a steroid injection in his left knee that gave him comfort.  I did talk to him about quad strengthening exercises and slowing down his activities.  I would like to see him back in 4 weeks to see how he is doing overall.  I did give him a work note as well.  All questions and concerns were answered and addressed.

## 2019-12-13 ENCOUNTER — Inpatient Hospital Stay: Payer: BC Managed Care – PPO | Admitting: Orthopaedic Surgery

## 2019-12-15 ENCOUNTER — Other Ambulatory Visit: Payer: Self-pay | Admitting: Family Medicine

## 2019-12-19 ENCOUNTER — Other Ambulatory Visit: Payer: Self-pay | Admitting: Family Medicine

## 2019-12-19 DIAGNOSIS — D649 Anemia, unspecified: Secondary | ICD-10-CM

## 2019-12-19 DIAGNOSIS — E785 Hyperlipidemia, unspecified: Secondary | ICD-10-CM

## 2019-12-19 DIAGNOSIS — Z125 Encounter for screening for malignant neoplasm of prostate: Secondary | ICD-10-CM

## 2019-12-20 ENCOUNTER — Other Ambulatory Visit (INDEPENDENT_AMBULATORY_CARE_PROVIDER_SITE_OTHER): Payer: BC Managed Care – PPO

## 2019-12-20 ENCOUNTER — Other Ambulatory Visit: Payer: Self-pay

## 2019-12-20 DIAGNOSIS — E785 Hyperlipidemia, unspecified: Secondary | ICD-10-CM

## 2019-12-20 DIAGNOSIS — D649 Anemia, unspecified: Secondary | ICD-10-CM | POA: Diagnosis not present

## 2019-12-20 DIAGNOSIS — Z125 Encounter for screening for malignant neoplasm of prostate: Secondary | ICD-10-CM

## 2019-12-20 LAB — LIPID PANEL
Cholesterol: 189 mg/dL (ref 0–200)
HDL: 65.2 mg/dL (ref >39.00)
LDL Cholesterol: 106 mg/dL — ABNORMAL HIGH (ref 0–99)
NonHDL: 123.51
Total CHOL/HDL Ratio: 3
Triglycerides: 86 mg/dL (ref 0.0–149.0)
VLDL: 17.2 mg/dL (ref 0.0–40.0)

## 2019-12-20 LAB — CBC WITH DIFFERENTIAL/PLATELET
Basophils Absolute: 0 10*3/uL (ref 0.0–0.1)
Basophils Relative: 0.5 % (ref 0.0–3.0)
Eosinophils Absolute: 0.3 10*3/uL (ref 0.0–0.7)
Eosinophils Relative: 3.1 % (ref 0.0–5.0)
HCT: 36.5 % — ABNORMAL LOW (ref 39.0–52.0)
Hemoglobin: 12.7 g/dL — ABNORMAL LOW (ref 13.0–17.0)
Lymphocytes Relative: 16 % (ref 12.0–46.0)
Lymphs Abs: 1.3 10*3/uL (ref 0.7–4.0)
MCHC: 34.9 g/dL (ref 30.0–36.0)
MCV: 85.6 fl (ref 78.0–100.0)
Monocytes Absolute: 0.6 10*3/uL (ref 0.1–1.0)
Monocytes Relative: 7.7 % (ref 3.0–12.0)
Neutro Abs: 5.9 10*3/uL (ref 1.4–7.7)
Neutrophils Relative %: 72.7 % (ref 43.0–77.0)
Platelets: 238 10*3/uL (ref 150.0–400.0)
RBC: 4.27 Mil/uL (ref 4.22–5.81)
RDW: 13.8 % (ref 11.5–15.5)
WBC: 8.2 10*3/uL (ref 4.0–10.5)

## 2019-12-20 LAB — COMPREHENSIVE METABOLIC PANEL
ALT: 19 U/L (ref 0–53)
AST: 16 U/L (ref 0–37)
Albumin: 4.3 g/dL (ref 3.5–5.2)
Alkaline Phosphatase: 41 U/L (ref 39–117)
BUN: 26 mg/dL — ABNORMAL HIGH (ref 6–23)
CO2: 27 mEq/L (ref 19–32)
Calcium: 9.1 mg/dL (ref 8.4–10.5)
Chloride: 98 mEq/L (ref 96–112)
Creatinine, Ser: 1.47 mg/dL (ref 0.40–1.50)
GFR: 50.56 mL/min — ABNORMAL LOW (ref >60.00)
Glucose, Bld: 99 mg/dL (ref 70–99)
Potassium: 4.7 mEq/L (ref 3.5–5.1)
Sodium: 133 mEq/L — ABNORMAL LOW (ref 135–145)
Total Bilirubin: 0.5 mg/dL (ref 0.2–1.2)
Total Protein: 6.7 g/dL (ref 6.0–8.3)

## 2019-12-20 LAB — PSA: PSA: 1.34 ng/mL (ref 0.10–4.00)

## 2020-01-17 ENCOUNTER — Telehealth: Payer: Self-pay

## 2020-01-17 ENCOUNTER — Encounter: Payer: Self-pay | Admitting: Orthopaedic Surgery

## 2020-01-17 ENCOUNTER — Ambulatory Visit (INDEPENDENT_AMBULATORY_CARE_PROVIDER_SITE_OTHER): Payer: BC Managed Care – PPO | Admitting: Orthopaedic Surgery

## 2020-01-17 DIAGNOSIS — Z9889 Other specified postprocedural states: Secondary | ICD-10-CM

## 2020-01-17 DIAGNOSIS — G8929 Other chronic pain: Secondary | ICD-10-CM

## 2020-01-17 DIAGNOSIS — M1712 Unilateral primary osteoarthritis, left knee: Secondary | ICD-10-CM

## 2020-01-17 DIAGNOSIS — M25562 Pain in left knee: Secondary | ICD-10-CM

## 2020-01-17 MED ORDER — NABUMETONE 750 MG PO TABS: 750.0000 mg | ORAL_TABLET | Freq: Two times a day (BID) | ORAL | 1 refills | Status: DC | PRN

## 2020-01-17 MED ORDER — HYDROCODONE-ACETAMINOPHEN 5-325 MG PO TABS
1.0000 | ORAL_TABLET | Freq: Four times a day (QID) | ORAL | 0 refills | Status: DC | PRN
Start: 2020-01-17 — End: 2020-05-21

## 2020-01-17 NOTE — Telephone Encounter (Signed)
Left knee gel injection ?

## 2020-01-17 NOTE — Progress Notes (Signed)
The patient comes in for continued follow-up after having a knee arthroscopy about 5 weeks ago.  We found a left medial meniscal tear as well as grade 2 to grade III chondromalacia the medial femoral condyle.  He is still having a significant pain since surgery.  At his last visit I was able to aspirate 30 cc of fluid off in the and place a steroid in the knee.  He said that helped for about a week.  He does state though that he has been working very hard and went right back to things very hard core.  This may have kept the knee irritated.  I did share with him the arthroscopy pictures again explained in detail using a knee model what we did for his knee.  On exam there is still significant medial joint line tenderness.  There is a mild effusion but I could not aspirate any fluid from the knee.  He is a good candidate for hyaluronic acid for the knee given the failed conservative treatment and the known chondromalacia with osteoarthritis medial compartment.  We will order that for his knee and he agreed.  He will work on quad strengthening exercises and I will send in [place] as an anti-inflammatory.  We will see him back in hopefully 4 weeks to place hyaluronic acid into the left knee.

## 2020-01-17 NOTE — Telephone Encounter (Signed)
Submitted for VOB for Synvisc one-left knee

## 2020-01-21 ENCOUNTER — Telehealth: Payer: Self-pay

## 2020-01-21 NOTE — Telephone Encounter (Signed)
Pt's insurance requires Prior Auth. The paperwork was completed and faxed to the insurance company

## 2020-01-29 ENCOUNTER — Telehealth: Payer: Self-pay

## 2020-01-29 NOTE — Telephone Encounter (Signed)
Approved for Synvisc one-Left knee Dr. Margarito Liner and Bill Covered @ 100% after $300 copay Prior auth required with Dillon Bjork # [id number] Dates: 01/21/20-07/19/20

## 2020-01-29 NOTE — Telephone Encounter (Signed)
Lvm for pt to call back to schedule appt. Ok to schedule at next available

## 2020-02-05 ENCOUNTER — Other Ambulatory Visit: Payer: Self-pay | Admitting: Family Medicine

## 2020-02-11 ENCOUNTER — Ambulatory Visit: Payer: BC Managed Care – PPO | Admitting: Orthopaedic Surgery

## 2020-02-11 ENCOUNTER — Encounter: Payer: Self-pay | Admitting: Orthopaedic Surgery

## 2020-02-11 DIAGNOSIS — M1712 Unilateral primary osteoarthritis, left knee: Secondary | ICD-10-CM

## 2020-02-11 MED ORDER — HYLAN G-F 20 48 MG/6ML IX SOSY
48.0000 mg | PREFILLED_SYRINGE | INTRA_ARTICULAR | Status: AC | PRN
  Administered 2020-02-11: 48 mg via INTRA_ARTICULAR

## 2020-02-11 NOTE — Progress Notes (Signed)
   Procedure Note  Patient: Eric Delgado             Date of Birth: August 14, 1955           MRN: [id number]             Visit Date: 02/11/2020  Procedures: Visit Diagnoses:  1. Unilateral primary osteoarthritis, left knee     Large Joint Inj: L knee on 02/11/2020 8:36 AM Indications: pain and diagnostic evaluation Details: 22 G 1.5 in needle, superolateral approach  Arthrogram: No  Medications: 48 mg Hylan 48 MG/6ML Outcome: tolerated well, no immediate complications Procedure, treatment alternatives, risks and benefits explained, specific risks discussed. Consent was given by the patient. Immediately prior to procedure a time out was called to verify the correct patient, procedure, equipment, support staff and site/side marked as required. Patient was prepped and draped in the usual sterile fashion.     The patient is a 64 year old gentleman who is here today for a hyaluronic acid injection in his left knee with Synvisc 1 to treat the pain from osteoarthritis in that left knee.  He has tried and failed other conservative treatment measures.  He has well-documented osteoarthritis in that knee and this is the neck step in recommending some type of treatment modality that would hopefully help provide an environment that can decrease his knee pain.  He is fully aware of the risk and benefits of these types of injections.  His left knee does have a large joint effusion today and I was able to aspirate about 60 cc of fluid from his knee.  I then place Synvisc 1 in the left knee.  Hopefully this will help.  I would like to see him back in about 3 months to see how he is doing overall.

## 2020-03-12 ENCOUNTER — Telehealth: Payer: Self-pay | Admitting: Orthopaedic Surgery

## 2020-03-12 NOTE — Telephone Encounter (Signed)
Received call from pt. He wants the pictures taken during his surgery [facility] (October). He states Dr. Ninfa Linden showed them to him and then put them in a folder. Please call [phone]

## 2020-03-12 NOTE — Telephone Encounter (Signed)
Ok to give him the pictures?

## 2020-03-12 NOTE — Telephone Encounter (Signed)
Pt called and informed that they are ready for pick up. He stated he will pick them up on friday

## 2020-03-12 NOTE — Telephone Encounter (Signed)
Mailed pt release form. He has requested records to go to [facility]. I advised him he would need to get MRI images from [place].

## 2020-03-12 NOTE — Telephone Encounter (Signed)
That will be fine. 

## 2020-03-12 NOTE — Telephone Encounter (Signed)
IC, he will sign release form for records at time he comes to pick up pics.

## 2020-03-14 NOTE — Telephone Encounter (Signed)
He will pick up records&pics when comes in to sign release. Everything is ready.

## 2020-03-27 ENCOUNTER — Telehealth: Payer: Self-pay

## 2020-03-27 NOTE — Telephone Encounter (Signed)
Pt called back and I made him aware of the messages below; pt would like to know if when he comes in within the next week for an appt will he have to pay a co-pay? He states he pays every time he comes and he mostly just receives the same info, so he would like to see if the co-pay could be waived?

## 2020-03-27 NOTE — Telephone Encounter (Signed)
We know he does have significant arthritis in that knee.  We did place a gel injection/hyaluronic acid in that knee just at the end of December.  That usually takes about 6 to 8 weeks to take effect.  If that is not helping, then consider knee replacement surgery would be the next step.  We should see him in the office sometime in the next week to discuss things further with him.

## 2020-03-27 NOTE — Telephone Encounter (Signed)
2 of his visits prior to the December visit were part of postoperative follow-up and she denies any co-pays.  Understanding the gel injection required a co-pay.  We have been trying to treat the pain from osteoarthritis in his knee and at this point if we need to proceed with any other intervention this would be considering a knee replacement.  I would need to see him in the office to be able to document the failure of other conservative treatment measures and then go over him in detail what knee replacement surgery involves.  We only just put the gel injection in his knee at the end of December so usually you get this at least 2 to 3 months to hopefully treat the pain in his knee and to get things to calm down with time, activity modification and anti-inflammatories.  I will have him try at least 4-6 more weeks of conservative treatment for his knee and give the gel shot hopefully a chance to take effect before considering knee replacement surgery.

## 2020-03-27 NOTE — Telephone Encounter (Signed)
Lvm for pt to call back to discuss

## 2020-03-27 NOTE — Telephone Encounter (Signed)
Patient left message wanting to know if you will do a TKA or what else you might could offer for him to get relief?  [phone]

## 2020-03-28 NOTE — Telephone Encounter (Signed)
Eric Delgado has already taken care of this

## 2020-03-28 NOTE — Telephone Encounter (Signed)
I called patient and scheduled office visit with Dr. Ninfa Linden per Dr. Trevor Mace note.

## 2020-03-28 NOTE — Telephone Encounter (Signed)
Pt called back checking on the copay status and I read him the previous and he states he's no longer concerned about co-pays, he would like to proceed with surgery. Pt would like a CB to let him know what he needs to do in order to get this surgery scheduled towards the end of February or beginning of March.   [phone]

## 2020-04-07 ENCOUNTER — Ambulatory Visit: Payer: Self-pay

## 2020-04-07 ENCOUNTER — Encounter: Payer: Self-pay | Admitting: Orthopaedic Surgery

## 2020-04-07 ENCOUNTER — Ambulatory Visit (INDEPENDENT_AMBULATORY_CARE_PROVIDER_SITE_OTHER): Payer: BC Managed Care – PPO | Admitting: Orthopaedic Surgery

## 2020-04-07 VITALS — Ht 69.5 in | Wt 215.0 lb

## 2020-04-07 DIAGNOSIS — M1712 Unilateral primary osteoarthritis, left knee: Secondary | ICD-10-CM | POA: Diagnosis not present

## 2020-04-07 DIAGNOSIS — G8929 Other chronic pain: Secondary | ICD-10-CM | POA: Diagnosis not present

## 2020-04-07 DIAGNOSIS — M25462 Effusion, left knee: Secondary | ICD-10-CM | POA: Diagnosis not present

## 2020-04-07 DIAGNOSIS — M25562 Pain in left knee: Secondary | ICD-10-CM

## 2020-04-07 MED ORDER — LIDOCAINE HCL 1 % IJ SOLN
3.0000 mL | INTRAMUSCULAR | Status: AC | PRN
  Administered 2020-04-07: 3 mL

## 2020-04-07 MED ORDER — METHYLPREDNISOLONE ACETATE 40 MG/ML IJ SUSP
40.0000 mg | INTRAMUSCULAR | Status: AC | PRN
  Administered 2020-04-07: 40 mg via INTRA_ARTICULAR

## 2020-04-07 NOTE — Progress Notes (Signed)
Office Visit Note   Patient: Eric Delgado           Date of Birth: 01/27/1956           MRN: [id number] Visit Date: 04/07/2020              Requested by: Ria Bush, MD [address] [place],  [place] [address] PCP: Ria Bush, MD   Assessment & Plan: Visit Diagnoses:  1. Chronic pain of left knee   2. Unilateral primary osteoarthritis, left knee   3. Effusion, left knee     Plan: I was able to aspirate 80 cc of clear yellow fluid from the left knee consistent with osteoarthritis.  I placed a steroid injection of the knee and this gave him immediate relief.  From there we talked in detail about knee replacement surgery.  I described his interoperative and postoperative course.  We talked about the risk and benefits of surgery.  Given the fact that he has failed conservative treat measures at this point including arthroscopy, steroid injections, hyaluronic acid injections, activity modification and time and given the fact that he is having such significant pain with his left knee as well as it is affecting his daily work life, we have recommended knee replacement this point.  I did show him a knee model and went over the x-rays as well.  I described what expect from his postoperative course as well.  All questions and concerns were answered and addressed.  We will work on getting this scheduled hopefully sometime in the near future.  Follow-Up Instructions: Return for 2 weeks post-op.   Orders:  Orders Placed This Encounter  Procedures  . Large Joint Inj  . XR Knee 1-2 Views Left   No orders of the defined types were placed in this encounter.     Procedures: Large Joint Inj: L knee on 04/07/2020 8:48 AM Indications: diagnostic evaluation and pain Details: 22 G 1.5 in needle, superolateral approach  Arthrogram: No  Medications: 3 mL lidocaine 1 %; 40 mg methylPREDNISolone acetate 40 MG/ML Outcome: tolerated well, no immediate complications Procedure,  treatment alternatives, risks and benefits explained, specific risks discussed. Consent was given by the patient. Immediately prior to procedure a time out was called to verify the correct patient, procedure, equipment, support staff and site/side marked as required. Patient was prepped and draped in the usual sterile fashion.       Clinical Data: No additional findings.   Subjective: No chief complaint on file. The patient comes in today with recurrent left knee pain and swelling.  We have tried arthroscopy for the left knee finding a significant medial meniscal tear and cartilage that was thinning on the medial compartment the knee.  He has varus malalignment that knee.  He is now walking with a limp and has had a recurrent effusion.  His pain is daily and is detriment affecting his mobility, his quality of life and his activities of daily living.  He does heavy manual labor.  We have tried steroid injections and hyaluronic acid injections for that knee and his pain is now 10 out of 10.  He is walking with a significant limp.  This is getting worse for him.  HPI  Review of Systems He currently denies a headache, chest pain, shortness of breath, fever, chills, nausea, vomiting  Objective: Vital Signs: Ht 5' 9.5" (1.765 m)   Wt 215 lb (97.5 kg)   BMI 31.29 kg/m   Physical Exam  He is alert and oriented x3 and in no acute distress Ortho Exam Examination of his left knee shows a very large effusion.  He has significant medial joint line tenderness and varus malalignment.  He has a painful arc of motion of that knee. Specialty Comments:  No specialty comments available.  Imaging: XR Knee 1-2 Views Left  Result Date: 04/07/2020 X-rays of the left knee are consistent with osteoarthritis with varus malalignment, medial joint space narrowing and patellofemoral narrowing.    PMFS History: Patient Active Problem List   Diagnosis Date Noted  . Unilateral primary osteoarthritis, left  knee 04/07/2020  . Status post arthroscopy of left knee 12/12/2019  . Other tear of medial meniscus, current injury, left knee, subsequent encounter 11/14/2019  . Obesity, Class I, BMI 30-34.9 07/31/2016  . Chronic insomnia 07/31/2016  . Osteoarthritis, multiple sites 12/21/2013  . Status post total replacement of left hip 12/21/2013  . Routine health maintenance 10/16/2010  . Ex-smoker 10/16/2010  . HLD (hyperlipidemia) 12/17/2006  . Irritability 12/17/2006  . Essential hypertension 12/17/2006  . Carpal tunnel syndrome 12/17/2006   Past Medical History:  Diagnosis Date  . Arthritis   . Difficulty sleeping    takes xanax for sleep  . Hyperlipidemia   . Hypertension   . SCIATICA, RIGHT 10/12/2007   Qualifier: Diagnosis of  By: Linda Hedges MD, Heinz Knuckles     Family History  Problem Relation Age of Onset  . Macular degeneration Mother   . Hypertension Mother   . Hyperlipidemia Mother   . Cancer Mother 27       lung (small cell), smoker   . Stroke Maternal Aunt   . Diabetes Neg Hx   . CAD Neg Hx     Past Surgical History:  Procedure Laterality Date  . APPENDECTOMY  1975  . CATARACT EXTRACTION Bilateral 02/2015   [facility]  . COLONOSCOPY  10/2010   WNL, rpt 10 yrs (Dr. Glennon [facility])  . ROTATOR CUFF REPAIR     left  . TOTAL HIP ARTHROPLASTY Right Spring '12   Dr. Alvan Dame - ceramic ball, fiber stem  . TOTAL HIP ARTHROPLASTY Left 12/21/2013   Procedure: LEFT TOTAL HIP ARTHROPLASTY ANTERIOR APPROACH;  Surgeon: Mcarthur Rossetti, MD;  Location: [facility];  Service: Orthopedics;  Laterality: Left;  Marland Kitchen VASECTOMY     Social History   Occupational History  . Not on file  Tobacco Use  . Smoking status: Former Smoker    Packs/day: 0.50    Years: 30.00    Pack years: 15.00    Types: Cigarettes    Quit date: 01/15/2010    Years since quitting: 10.2  . Smokeless tobacco: Never Used  Vaping Use  . Vaping Use: Never used  Substance and Sexual  Activity  . Alcohol use: Yes    Comment: occ  . Drug use: No  . Sexual activity: Yes    Partners: Female

## 2020-04-15 HISTORY — PX: COLONOSCOPY: SHX174

## 2020-04-25 DIAGNOSIS — Z01818 Encounter for other preprocedural examination: Secondary | ICD-10-CM | POA: Diagnosis not present

## 2020-05-01 ENCOUNTER — Telehealth: Payer: Self-pay

## 2020-05-01 NOTE — Telephone Encounter (Signed)
Patient is requesting a temporary handicap placard.  Please call when application ready.  Surgery is planned for 05/20/20.  Thanks!

## 2020-05-02 NOTE — Telephone Encounter (Signed)
Is this ok?

## 2020-05-05 NOTE — Telephone Encounter (Signed)
LMOM  for patient handicap ready at front desk

## 2020-05-05 NOTE — Telephone Encounter (Signed)
That will be fine. 

## 2020-05-06 ENCOUNTER — Other Ambulatory Visit: Payer: Self-pay

## 2020-05-09 DIAGNOSIS — Z01812 Encounter for preprocedural laboratory examination: Secondary | ICD-10-CM | POA: Diagnosis not present

## 2020-05-13 ENCOUNTER — Other Ambulatory Visit: Payer: Self-pay | Admitting: Physician Assistant

## 2020-05-14 DIAGNOSIS — Z1211 Encounter for screening for malignant neoplasm of colon: Secondary | ICD-10-CM | POA: Diagnosis not present

## 2020-05-14 DIAGNOSIS — K5730 Diverticulosis of large intestine without perforation or abscess without bleeding: Secondary | ICD-10-CM | POA: Diagnosis not present

## 2020-05-14 DIAGNOSIS — D123 Benign neoplasm of transverse colon: Secondary | ICD-10-CM | POA: Diagnosis not present

## 2020-05-15 NOTE — Progress Notes (Signed)
Surgical Instructions    Your procedure is scheduled on Tuesday, April 5th, 2022  Report to [facility] Main Entrance "A" at 10:00 A.M., then check in with the Admitting office.  Call this number if you have problems the morning of surgery:  [phone]   If you have any questions prior to your surgery date call [phone]: Open Monday-Friday 8am-4pm    Remember:  Do not eat after midnight the night before your surgery  You may drink clear liquids until 09:00 A.M. the morning of your surgery.   Clear liquids allowed are: Water, Non-Citrus Juices (without pulp), Carbonated Beverages, Clear Tea, Black Coffee Only, and Gatorade  Patient Instructions  . The night before surgery:  o No food after midnight. ONLY clear liquids after midnight  . The day of surgery (if you do NOT have diabetes):  o Drink ONE (1) Pre-Surgery Clear Ensure by 09:00 A.M. the morning of surgery. Drink in one sitting. Do not sip.  o This drink was given to you during your hospital  pre-op appointment visit.  o Nothing else to drink after completing the  Pre-Surgery Clear Ensure.          If you have questions, please contact your surgeon's office.     Take these medicines the morning of surgery with A SIP OF WATER   amLODipine (NORVASC)  If needed:   HYDROcodone-acetaminophen (NORCO/VICODIN)  Follow your surgeon's instructions on when to stop Aspirin.  If no instructions were given by your surgeon then you will need to call the office to get those instructions.    As of today, STOP taking any Aspirin (unless otherwise instructed by your surgeon) Aleve, Naproxen, Ibuprofen, Motrin, Advil, Goody's, BC's, all herbal medications, fish oil, and all vitamins.                     Do not wear jewelry.            Do not wear lotions, powders, colognes, or deodorant.            Men may shave face and neck.            Do not bring valuables to the hospital.            [facility] is not responsible for any  belongings or valuables.  Do NOT Smoke (Tobacco/Vaping) or drink Alcohol 24 hours prior to your procedure If you use a CPAP at night, you may bring all equipment for your overnight stay.   Contacts, glasses, dentures or bridgework may not be worn into surgery, please bring cases for these belongings   For patients admitted to the hospital, discharge time will be determined by your treatment team.   Patients discharged the day of surgery will not be allowed to drive home, and someone needs to stay with them for 24 hours.    Special instructions:   [place]- Preparing For Surgery  Before surgery, you can play an important role. Because skin is not sterile, your skin needs to be as free of germs as possible. You can reduce the number of germs on your skin by washing with CHG (chlorahexidine gluconate) Soap before surgery.  CHG is an antiseptic cleaner which kills germs and bonds with the skin to continue killing germs even after washing.    Oral Hygiene is also important to reduce your risk of infection.  Remember - BRUSH YOUR TEETH THE MORNING OF SURGERY WITH YOUR REGULAR TOOTHPASTE  Please do  not use if you have an allergy to CHG or antibacterial soaps. If your skin becomes reddened/irritated stop using the CHG.  Do not shave (including legs and underarms) for at least 48 hours prior to first CHG shower. It is OK to shave your face.  Please follow these instructions carefully.   1. Shower the NIGHT BEFORE SURGERY and the MORNING OF SURGERY  2. If you chose to wash your hair, wash your hair first as usual with your normal shampoo.  3. After you shampoo, rinse your hair and body thoroughly to remove the shampoo.  4. Use CHG Soap as you would any other liquid soap. You can apply CHG directly to the skin and wash gently with a scrungie or a clean washcloth.   5. Apply the CHG Soap to your body ONLY FROM THE NECK DOWN.  Do not use on open wounds or open sores. Avoid contact with your  eyes, ears, mouth and genitals (private parts). Wash Face and genitals (private parts)  with your normal soap.   6. Wash thoroughly, paying special attention to the area where your surgery will be performed.  7. Thoroughly rinse your body with warm water from the neck down.  8. DO NOT shower/wash with your normal soap after using and rinsing off the CHG Soap.  9. Pat yourself dry with a CLEAN TOWEL.  10. Wear CLEAN PAJAMAS to bed the night before surgery  11. Place CLEAN SHEETS on your bed the night before your surgery  12. DO NOT SLEEP WITH PETS.   Day of Surgery: Shower with CHG soap. Wear Clean/Comfortable clothing the morning of surgery Do not apply any deodorants/lotions.   Remember to brush your teeth WITH YOUR REGULAR TOOTHPASTE.   Please read over the following fact sheets that you were given.

## 2020-05-16 ENCOUNTER — Other Ambulatory Visit: Payer: Self-pay

## 2020-05-16 ENCOUNTER — Encounter (HOSPITAL_COMMUNITY)
Admission: RE | Admit: 2020-05-16 | Discharge: 2020-05-16 | Disposition: A | Discharge status: Home / Self Care | Payer: BC Managed Care – PPO | Source: Ambulatory Visit | Attending: Orthopaedic Surgery | Admitting: Orthopaedic Surgery

## 2020-05-16 ENCOUNTER — Encounter (HOSPITAL_COMMUNITY): Payer: Self-pay

## 2020-05-16 DIAGNOSIS — Z20822 Contact with and (suspected) exposure to covid-19: Secondary | ICD-10-CM | POA: Insufficient documentation

## 2020-05-16 DIAGNOSIS — Z01812 Encounter for preprocedural laboratory examination: Secondary | ICD-10-CM | POA: Insufficient documentation

## 2020-05-16 HISTORY — DX: Personal history of other diseases of the digestive system: Z87.19

## 2020-05-16 LAB — BASIC METABOLIC PANEL
Anion gap: 8 (ref 5–15)
BUN: 21 mg/dL (ref 8–23)
CO2: 25 mmol/L (ref 22–32)
Calcium: 9.5 mg/dL (ref 8.9–10.3)
Chloride: 101 mmol/L (ref 98–111)
Creatinine, Ser: 1.14 mg/dL (ref 0.61–1.24)
GFR, Estimated: >60 mL/min (ref >60)
Glucose, Bld: 117 mg/dL — ABNORMAL HIGH (ref 70–99)
Potassium: 3.9 mmol/L (ref 3.5–5.1)
Sodium: 134 mmol/L — ABNORMAL LOW (ref 135–145)

## 2020-05-16 LAB — CBC
HCT: 37.9 % — ABNORMAL LOW (ref 39.0–52.0)
Hemoglobin: 13 g/dL (ref 13.0–17.0)
MCH: 29.5 pg (ref 26.0–34.0)
MCHC: 34.3 g/dL (ref 30.0–36.0)
MCV: 85.9 fL (ref 80.0–100.0)
Platelets: 238 10*3/uL (ref 150–400)
RBC: 4.41 MIL/uL (ref 4.22–5.81)
RDW: 13.3 % (ref 11.5–15.5)
WBC: 6.5 10*3/uL (ref 4.0–10.5)
nRBC: 0 % (ref 0.0–0.2)

## 2020-05-16 LAB — TYPE AND SCREEN
ABO/RH(D): A NEG
Antibody Screen: NEGATIVE

## 2020-05-16 LAB — SURGICAL PCR SCREEN
MRSA, PCR: NEGATIVE
Staphylococcus aureus: POSITIVE — AB

## 2020-05-16 LAB — SARS CORONAVIRUS 2 (TAT 6-24 HRS): SARS Coronavirus 2: NEGATIVE

## 2020-05-16 NOTE — Progress Notes (Signed)
Staff messaged dr. Ninfa Linden about positive staph results on mrsa swab.

## 2020-05-16 NOTE — Progress Notes (Signed)
PCP - Orlie Dakin Cardiologist - denies  PPM/ICD - denies  Chest x-ray - n/a EKG - 11/19/19 Stress Test - 2009 ECHO - denies Cardiac Cath - denies  Sleep Study - denies    Follow your surgeon's instructions on when to stop Aspirin.  If no instructions were given by your surgeon then you will need to call the office to get those instructions.     ERAS Protcol -yes PRE-SURGERY Ensure or G2- ensure given  COVID TEST- 05/16/20   Anesthesia review: no  Patient denies shortness of breath, fever, cough and chest pain at PAT appointment   All instructions explained to the patient, with a verbal understanding of the material. Patient agrees to go over the instructions while at home for a better understanding. Patient also instructed to self quarantine after being tested for COVID-19. The opportunity to ask questions was provided.

## 2020-05-20 ENCOUNTER — Ambulatory Visit (HOSPITAL_COMMUNITY): Payer: BC Managed Care – PPO | Admitting: Vascular Surgery

## 2020-05-20 ENCOUNTER — Other Ambulatory Visit: Payer: Self-pay

## 2020-05-20 ENCOUNTER — Encounter (HOSPITAL_COMMUNITY)
Admission: RE | Disposition: A | Discharge status: Home / Self Care | Payer: Self-pay | Source: Home / Self Care | Attending: Orthopaedic Surgery

## 2020-05-20 ENCOUNTER — Observation Stay (HOSPITAL_COMMUNITY)
Admission: RE | Admit: 2020-05-20 | Discharge: 2020-05-21 | Disposition: A | Discharge status: Home / Self Care | Payer: BC Managed Care – PPO | Attending: Orthopaedic Surgery | Admitting: Orthopaedic Surgery

## 2020-05-20 ENCOUNTER — Encounter (HOSPITAL_COMMUNITY): Payer: Self-pay | Admitting: Orthopaedic Surgery

## 2020-05-20 ENCOUNTER — Observation Stay (HOSPITAL_COMMUNITY): Payer: BC Managed Care – PPO

## 2020-05-20 ENCOUNTER — Ambulatory Visit (HOSPITAL_COMMUNITY): Payer: BC Managed Care – PPO | Admitting: Anesthesiology

## 2020-05-20 DIAGNOSIS — Z87891 Personal history of nicotine dependence: Secondary | ICD-10-CM | POA: Insufficient documentation

## 2020-05-20 DIAGNOSIS — E785 Hyperlipidemia, unspecified: Secondary | ICD-10-CM | POA: Diagnosis not present

## 2020-05-20 DIAGNOSIS — M1712 Unilateral primary osteoarthritis, left knee: Secondary | ICD-10-CM | POA: Diagnosis not present

## 2020-05-20 DIAGNOSIS — I10 Essential (primary) hypertension: Secondary | ICD-10-CM | POA: Diagnosis not present

## 2020-05-20 DIAGNOSIS — G8918 Other acute postprocedural pain: Secondary | ICD-10-CM | POA: Diagnosis not present

## 2020-05-20 DIAGNOSIS — Z79899 Other long term (current) drug therapy: Secondary | ICD-10-CM | POA: Insufficient documentation

## 2020-05-20 DIAGNOSIS — Z96652 Presence of left artificial knee joint: Secondary | ICD-10-CM | POA: Diagnosis not present

## 2020-05-20 HISTORY — PX: TOTAL KNEE ARTHROPLASTY: SHX125

## 2020-05-20 SURGERY — ARTHROPLASTY, KNEE, TOTAL
Anesthesia: Spinal | Site: Knee | Laterality: Left

## 2020-05-20 MED ORDER — ALUM & MAG HYDROXIDE-SIMETH 200-200-20 MG/5ML PO SUSP: 30.0000 mL | ORAL | Status: DC | PRN

## 2020-05-20 MED ORDER — AMLODIPINE BESYLATE 5 MG PO TABS
5.0000 mg | ORAL_TABLET | Freq: Every day | ORAL | Status: DC
  Administered 2020-05-20 – 2020-05-21 (×2): 5 mg via ORAL
  Filled 2020-05-20 (×2): qty 1

## 2020-05-20 MED ORDER — METHOCARBAMOL 500 MG PO TABS
500.0000 mg | ORAL_TABLET | Freq: Four times a day (QID) | ORAL | Status: DC | PRN
  Administered 2020-05-20 – 2020-05-21 (×3): 500 mg via ORAL
  Filled 2020-05-20 (×3): qty 1

## 2020-05-20 MED ORDER — MIDAZOLAM HCL 5 MG/5ML IJ SOLN
INTRAMUSCULAR | Status: DC | PRN
  Administered 2020-05-20: 2 mg via INTRAVENOUS

## 2020-05-20 MED ORDER — LACTATED RINGERS IV SOLN: INTRAVENOUS | Status: DC

## 2020-05-20 MED ORDER — METHOCARBAMOL 1000 MG/10ML IJ SOLN
500.0000 mg | Freq: Four times a day (QID) | INTRAVENOUS | Status: DC | PRN
  Filled 2020-05-20: qty 5

## 2020-05-20 MED ORDER — ACETAMINOPHEN 500 MG PO TABS: 1000.0000 mg | ORAL_TABLET | Freq: Once | ORAL | Status: DC

## 2020-05-20 MED ORDER — ROPIVACAINE HCL 7.5 MG/ML IJ SOLN
INTRAMUSCULAR | Status: DC | PRN
  Administered 2020-05-20: 20 mL via PERINEURAL

## 2020-05-20 MED ORDER — OXYCODONE HCL 5 MG PO TABS: 5.0000 mg | ORAL_TABLET | Freq: Once | ORAL | Status: DC | PRN

## 2020-05-20 MED ORDER — OXYCODONE HCL 5 MG/5ML PO SOLN: 5.0000 mg | Freq: Once | ORAL | Status: DC | PRN

## 2020-05-20 MED ORDER — DOCUSATE SODIUM 100 MG PO CAPS
100.0000 mg | ORAL_CAPSULE | Freq: Two times a day (BID) | ORAL | Status: DC
  Administered 2020-05-20 – 2020-05-21 (×2): 100 mg via ORAL
  Filled 2020-05-20 (×2): qty 1

## 2020-05-20 MED ORDER — ACETAMINOPHEN 325 MG PO TABS: 325.0000 mg | ORAL_TABLET | Freq: Four times a day (QID) | ORAL | Status: DC | PRN

## 2020-05-20 MED ORDER — DIPHENHYDRAMINE HCL 12.5 MG/5ML PO ELIX
12.5000 mg | ORAL_SOLUTION | ORAL | Status: DC | PRN
  Filled 2020-05-20: qty 10

## 2020-05-20 MED ORDER — HYDROMORPHONE HCL 2 MG PO TABS
2.0000 mg | ORAL_TABLET | ORAL | Status: DC | PRN
  Administered 2020-05-20 – 2020-05-21 (×5): 2 mg via ORAL
  Filled 2020-05-20 (×5): qty 1

## 2020-05-20 MED ORDER — CEFAZOLIN SODIUM-DEXTROSE 2-4 GM/100ML-% IV SOLN
2.0000 g | INTRAVENOUS | Status: AC
  Administered 2020-05-20: 2 g via INTRAVENOUS
  Filled 2020-05-20: qty 100

## 2020-05-20 MED ORDER — POVIDONE-IODINE 10 % EX SWAB
2.0000 "application " | Freq: Once | CUTANEOUS | Status: AC
  Administered 2020-05-20: 2 via TOPICAL

## 2020-05-20 MED ORDER — ASPIRIN 81 MG PO CHEW
81.0000 mg | CHEWABLE_TABLET | Freq: Two times a day (BID) | ORAL | Status: DC
  Administered 2020-05-20 – 2020-05-21 (×2): 81 mg via ORAL
  Filled 2020-05-20 (×2): qty 1

## 2020-05-20 MED ORDER — TRANEXAMIC ACID-NACL 1000-0.7 MG/100ML-% IV SOLN
INTRAVENOUS | Status: AC
  Filled 2020-05-20: qty 200

## 2020-05-20 MED ORDER — MIDAZOLAM HCL 2 MG/2ML IJ SOLN
INTRAMUSCULAR | Status: AC
  Filled 2020-05-20: qty 2

## 2020-05-20 MED ORDER — PROMETHAZINE HCL 25 MG/ML IJ SOLN: 6.2500 mg | INTRAMUSCULAR | Status: DC | PRN

## 2020-05-20 MED ORDER — FENTANYL CITRATE (PF) 100 MCG/2ML IJ SOLN
INTRAMUSCULAR | Status: AC
  Filled 2020-05-20: qty 2

## 2020-05-20 MED ORDER — PHENYLEPHRINE HCL-NACL 10-0.9 MG/250ML-% IV SOLN
INTRAVENOUS | Status: DC | PRN
  Administered 2020-05-20: 40 ug/min via INTRAVENOUS

## 2020-05-20 MED ORDER — ONDANSETRON HCL 4 MG PO TABS: 4.0000 mg | ORAL_TABLET | Freq: Four times a day (QID) | ORAL | Status: DC | PRN

## 2020-05-20 MED ORDER — DEXAMETHASONE SODIUM PHOSPHATE 10 MG/ML IJ SOLN
INTRAMUSCULAR | Status: DC | PRN
  Administered 2020-05-20: 5 mg via INTRAVENOUS

## 2020-05-20 MED ORDER — PROPOFOL 500 MG/50ML IV EMUL
INTRAVENOUS | Status: DC | PRN
  Administered 2020-05-20: 100 ug/kg/min via INTRAVENOUS

## 2020-05-20 MED ORDER — KETAMINE HCL 50 MG/5ML IJ SOSY
PREFILLED_SYRINGE | INTRAMUSCULAR | Status: AC
  Filled 2020-05-20: qty 5

## 2020-05-20 MED ORDER — MIDAZOLAM HCL 2 MG/2ML IJ SOLN: 0.5000 mg | Freq: Once | INTRAMUSCULAR | Status: DC | PRN

## 2020-05-20 MED ORDER — FENTANYL CITRATE (PF) 250 MCG/5ML IJ SOLN
INTRAMUSCULAR | Status: DC | PRN
  Administered 2020-05-20: 50 ug via INTRAVENOUS
  Administered 2020-05-20: 100 ug via INTRAVENOUS

## 2020-05-20 MED ORDER — METOCLOPRAMIDE HCL 5 MG/ML IJ SOLN: 5.0000 mg | Freq: Three times a day (TID) | INTRAMUSCULAR | Status: DC | PRN

## 2020-05-20 MED ORDER — FENTANYL CITRATE (PF) 250 MCG/5ML IJ SOLN
INTRAMUSCULAR | Status: AC
  Filled 2020-05-20: qty 5

## 2020-05-20 MED ORDER — ADULT MULTIVITAMIN W/MINERALS CH
1.0000 | ORAL_TABLET | ORAL | Status: DC
  Administered 2020-05-21: 1 via ORAL
  Filled 2020-05-20: qty 1

## 2020-05-20 MED ORDER — MENTHOL 3 MG MT LOZG: 1.0000 | LOZENGE | OROMUCOSAL | Status: DC | PRN

## 2020-05-20 MED ORDER — METOCLOPRAMIDE HCL 5 MG PO TABS
5.0000 mg | ORAL_TABLET | Freq: Three times a day (TID) | ORAL | Status: DC | PRN
Start: 2020-05-20 — End: 2020-05-21
  Administered 2020-05-21: 5 mg via ORAL
  Filled 2020-05-20 (×2): qty 1

## 2020-05-20 MED ORDER — ONDANSETRON HCL 4 MG/2ML IJ SOLN
INTRAMUSCULAR | Status: DC | PRN
  Administered 2020-05-20: 4 mg via INTRAVENOUS

## 2020-05-20 MED ORDER — ZOLPIDEM TARTRATE 5 MG PO TABS: 5.0000 mg | ORAL_TABLET | Freq: Every evening | ORAL | Status: DC | PRN

## 2020-05-20 MED ORDER — TRANEXAMIC ACID-NACL 1000-0.7 MG/100ML-% IV SOLN
1000.0000 mg | INTRAVENOUS | Status: AC
  Administered 2020-05-20: 1000 mg via INTRAVENOUS
  Filled 2020-05-20: qty 100

## 2020-05-20 MED ORDER — SODIUM CHLORIDE 0.9 % IV SOLN: INTRAVENOUS | Status: DC

## 2020-05-20 MED ORDER — CEFAZOLIN SODIUM-DEXTROSE 1-4 GM/50ML-% IV SOLN
1.0000 g | Freq: Four times a day (QID) | INTRAVENOUS | Status: AC
  Administered 2020-05-20 (×2): 1 g via INTRAVENOUS
  Filled 2020-05-20 (×2): qty 50

## 2020-05-20 MED ORDER — KETOROLAC TROMETHAMINE 15 MG/ML IJ SOLN
15.0000 mg | Freq: Four times a day (QID) | INTRAMUSCULAR | Status: DC
  Administered 2020-05-20 – 2020-05-21 (×3): 15 mg via INTRAVENOUS
  Filled 2020-05-20 (×3): qty 1

## 2020-05-20 MED ORDER — OXYCODONE HCL 5 MG PO TABS: 10.0000 mg | ORAL_TABLET | ORAL | Status: DC | PRN

## 2020-05-20 MED ORDER — HYDROMORPHONE HCL 1 MG/ML IJ SOLN
1.0000 mg | INTRAMUSCULAR | Status: DC | PRN
Start: 2020-05-20 — End: 2020-05-21
  Administered 2020-05-20 (×2): 1 mg via INTRAVENOUS
  Filled 2020-05-20 (×2): qty 1

## 2020-05-20 MED ORDER — PANTOPRAZOLE SODIUM 40 MG PO TBEC
40.0000 mg | DELAYED_RELEASE_TABLET | Freq: Every day | ORAL | Status: DC
  Administered 2020-05-20 – 2020-05-21 (×2): 40 mg via ORAL
  Filled 2020-05-20 (×2): qty 1

## 2020-05-20 MED ORDER — PROPOFOL 1000 MG/100ML IV EMUL
INTRAVENOUS | Status: AC
  Filled 2020-05-20: qty 200

## 2020-05-20 MED ORDER — ROSUVASTATIN CALCIUM 20 MG PO TABS
20.0000 mg | ORAL_TABLET | Freq: Every day | ORAL | Status: DC
  Administered 2020-05-20: 20 mg via ORAL
  Filled 2020-05-20: qty 1

## 2020-05-20 MED ORDER — MEPERIDINE HCL 25 MG/ML IJ SOLN: 6.2500 mg | INTRAMUSCULAR | Status: DC | PRN

## 2020-05-20 MED ORDER — ORAL CARE MOUTH RINSE: 15.0000 mL | Freq: Once | OROMUCOSAL | Status: AC

## 2020-05-20 MED ORDER — BUPIVACAINE IN DEXTROSE 0.75-8.25 % IT SOLN
INTRATHECAL | Status: DC | PRN
  Administered 2020-05-20: 15 mg via INTRATHECAL

## 2020-05-20 MED ORDER — PHENOL 1.4 % MT LIQD: 1.0000 | OROMUCOSAL | Status: DC | PRN

## 2020-05-20 MED ORDER — HYDROMORPHONE HCL 1 MG/ML IJ SOLN: 0.2500 mg | INTRAMUSCULAR | Status: DC | PRN

## 2020-05-20 MED ORDER — ONDANSETRON HCL 4 MG/2ML IJ SOLN: 4.0000 mg | Freq: Four times a day (QID) | INTRAMUSCULAR | Status: DC | PRN

## 2020-05-20 MED ORDER — OXYCODONE HCL 5 MG PO TABS: 5.0000 mg | ORAL_TABLET | ORAL | Status: DC | PRN

## 2020-05-20 MED ORDER — HYDROCHLOROTHIAZIDE 25 MG PO TABS
25.0000 mg | ORAL_TABLET | Freq: Every day | ORAL | Status: DC
  Administered 2020-05-20 – 2020-05-21 (×2): 25 mg via ORAL
  Filled 2020-05-20 (×2): qty 1

## 2020-05-20 MED ORDER — LIDOCAINE 2% (20 MG/ML) 5 ML SYRINGE
INTRAMUSCULAR | Status: AC
  Filled 2020-05-20: qty 5

## 2020-05-20 MED ORDER — CHLORHEXIDINE GLUCONATE 0.12 % MT SOLN
15.0000 mL | Freq: Once | OROMUCOSAL | Status: AC
  Administered 2020-05-20: 15 mL via OROMUCOSAL
  Filled 2020-05-20: qty 15

## 2020-05-20 SURGICAL SUPPLY — 65 items
BANDAGE ESMARK 6X9 LF (GAUZE/BANDAGES/DRESSINGS) ×1 IMPLANT
BLADE SAG 18X100X1.27 (BLADE) ×2 IMPLANT
BNDG CMPR 9X6 STRL LF SNTH (GAUZE/BANDAGES/DRESSINGS) ×1
BNDG ELASTIC 6X5.8 VLCR STR LF (GAUZE/BANDAGES/DRESSINGS) ×4 IMPLANT
BNDG ESMARK 6X9 LF (GAUZE/BANDAGES/DRESSINGS) ×2
BOWL SMART MIX CTS (DISPOSABLE) ×2 IMPLANT
BSPLAT TIB 4 KN TRITANIUM (Knees) ×1 IMPLANT
COVER SURGICAL LIGHT HANDLE (MISCELLANEOUS) ×2 IMPLANT
COVER WAND RF STERILE (DRAPES) ×2 IMPLANT
CUFF TOURN SGL QUICK 34 (TOURNIQUET CUFF) ×2
CUFF TOURN SGL QUICK 42 (TOURNIQUET CUFF) IMPLANT
CUFF TRNQT CYL 34X4.125X (TOURNIQUET CUFF) ×1 IMPLANT
DRAPE EXTREMITY T 121X128X90 (DISPOSABLE) ×2 IMPLANT
DRAPE HALF SHEET 40X57 (DRAPES) ×2 IMPLANT
DRAPE U-SHAPE 47X51 STRL (DRAPES) ×2 IMPLANT
DRSG PAD ABDOMINAL 8X10 ST (GAUZE/BANDAGES/DRESSINGS) ×2 IMPLANT
DURAPREP 26ML APPLICATOR (WOUND CARE) ×4 IMPLANT
ELECT CAUTERY BLADE 6.4 (BLADE) ×2 IMPLANT
ELECT REM PT RETURN 9FT ADLT (ELECTROSURGICAL) ×2
ELECTRODE REM PT RTRN 9FT ADLT (ELECTROSURGICAL) ×1 IMPLANT
FACESHIELD WRAPAROUND (MASK) ×4 IMPLANT
FEMORAL POSTERIOR SZ5 LFT (Femur) ×1 IMPLANT
GAUZE SPONGE 4X4 12PLY STRL (GAUZE/BANDAGES/DRESSINGS) ×2 IMPLANT
GAUZE XEROFORM 1X8 LF (GAUZE/BANDAGES/DRESSINGS) ×2 IMPLANT
GLOVE ORTHO TXT STRL SZ7.5 (GLOVE) ×2 IMPLANT
GLOVE SRG 8 PF TXTR STRL LF DI (GLOVE) ×2 IMPLANT
GLOVE SURG ORTHO 8.0 STRL STRW (GLOVE) ×2 IMPLANT
GLOVE SURG UNDER POLY LF SZ8 (GLOVE) ×4
GOWN STRL REUS W/ TWL LRG LVL3 (GOWN DISPOSABLE) ×1 IMPLANT
GOWN STRL REUS W/ TWL XL LVL3 (GOWN DISPOSABLE) ×2 IMPLANT
GOWN STRL REUS W/TWL LRG LVL3 (GOWN DISPOSABLE) ×2
GOWN STRL REUS W/TWL XL LVL3 (GOWN DISPOSABLE) ×4
HANDPIECE INTERPULSE COAX TIP (DISPOSABLE) ×2
IMMOBILIZER KNEE 22 UNIV (SOFTGOODS) ×2 IMPLANT
INSERT TIBIAL TRIATHLON PS X3 (Insert) ×2 IMPLANT
KIT BASIN OR (CUSTOM PROCEDURE TRAY) ×2 IMPLANT
KIT TURNOVER KIT B (KITS) ×2 IMPLANT
KNEE PATELLA ASYMMETRIC 10X32 (Knees) ×2 IMPLANT
KNEE TIBIAL COMP TRI SZ4 (Knees) ×2 IMPLANT
MANIFOLD NEPTUNE II (INSTRUMENTS) ×2 IMPLANT
NEEDLE 18GX1X1/2 (RX/OR ONLY) (NEEDLE) IMPLANT
NS IRRIG 1000ML POUR BTL (IV SOLUTION) ×2 IMPLANT
PACK TOTAL JOINT (CUSTOM PROCEDURE TRAY) ×2 IMPLANT
PAD ARMBOARD 7.5X6 YLW CONV (MISCELLANEOUS) ×2 IMPLANT
PADDING CAST COTTON 6X4 STRL (CAST SUPPLIES) ×2 IMPLANT
PIN FLUTED HEDLESS FIX 3.5X1/8 (PIN) ×2 IMPLANT
POSTERIOR FEMORAL SZ5 LFT (Femur) ×2 IMPLANT
SET HNDPC FAN SPRY TIP SCT (DISPOSABLE) ×1 IMPLANT
SET PAD KNEE POSITIONER (MISCELLANEOUS) ×2 IMPLANT
STAPLER VISISTAT 35W (STAPLE) ×2 IMPLANT
STRIP CLOSURE SKIN 1/2X4 (GAUZE/BANDAGES/DRESSINGS) IMPLANT
SUCTION FRAZIER HANDLE 10FR (MISCELLANEOUS) ×2
SUCTION TUBE FRAZIER 10FR DISP (MISCELLANEOUS) ×1 IMPLANT
SUT MNCRL AB 4-0 PS2 18 (SUTURE) IMPLANT
SUT VIC AB 0 CT1 27 (SUTURE) ×4
SUT VIC AB 0 CT1 27XBRD ANBCTR (SUTURE) ×2 IMPLANT
SUT VIC AB 1 CT1 27 (SUTURE) ×4
SUT VIC AB 1 CT1 27XBRD ANBCTR (SUTURE) ×2 IMPLANT
SUT VIC AB 2-0 CT1 27 (SUTURE) ×4
SUT VIC AB 2-0 CT1 TAPERPNT 27 (SUTURE) ×2 IMPLANT
SYR 50ML LL SCALE MARK (SYRINGE) IMPLANT
TOWEL GREEN STERILE (TOWEL DISPOSABLE) ×2 IMPLANT
TOWEL GREEN STERILE FF (TOWEL DISPOSABLE) ×2 IMPLANT
TRAY CATH 16FR W/PLASTIC CATH (SET/KITS/TRAYS/PACK) ×2 IMPLANT
WRAP KNEE MAXI GEL POST OP (GAUZE/BANDAGES/DRESSINGS) ×2 IMPLANT

## 2020-05-20 NOTE — Anesthesia Procedure Notes (Signed)
Procedure Name: MAC Date/Time: 05/20/2020 11:02 AM Performed by: Hoy Morn, CRNA Pre-anesthesia Checklist: Patient identified, Emergency Drugs available, Suction available and Patient being monitored Patient Re-evaluated:Patient Re-evaluated prior to induction Oxygen Delivery Method: Simple face mask

## 2020-05-20 NOTE — Transfer of Care (Signed)
Immediate Anesthesia Transfer of Care Note  Patient: Eric Delgado  Procedure(s) Performed: LEFT TOTAL KNEE ARTHROPLASTY (Left Knee)  Patient Location: PACU  Anesthesia Type:Spinal  Level of Consciousness: awake  Airway & Oxygen Therapy: Patient Spontanous Breathing and Patient connected to face mask oxygen  Post-op Assessment: Report given to RN and Post -op Vital signs reviewed and stable  Post vital signs: Reviewed and stable  Last Vitals:  Vitals Value Taken Time  BP 113/68 05/20/20 1252  Temp    Pulse 69 05/20/20 1258  Resp 15 05/20/20 1258  SpO2 95 % 05/20/20 1258  Vitals shown include unvalidated device data.  Last Pain:  Vitals:   05/20/20 1017  TempSrc: Oral  PainSc: 0-No pain         Complications: No complications documented.

## 2020-05-20 NOTE — Anesthesia Procedure Notes (Signed)
Spinal  Patient location during procedure: OR End time: 05/20/2020 11:02 AM Reason for block: surgical anesthesia Staffing Performed: anesthesiologist  Anesthesiologist: Annye Asa, MD Preanesthetic Checklist Completed: patient identified, IV checked, site marked, risks and benefits discussed, surgical consent, monitors and equipment checked, pre-op evaluation and timeout performed Spinal Block Patient position: sitting Prep: DuraPrep and site prepped and draped Patient monitoring: blood pressure, continuous pulse ox, cardiac monitor and heart rate Approach: midline Location: L3-4 Injection technique: single-shot Needle Needle type: Pencan and Introducer  Needle gauge: 24 G Needle length: 9 cm Assessment Events: CSF return Additional Notes Pt identified in Operating room.  Monitors applied. Working IV access confirmed. Sterile prep, drape lumbar spine.  1% lido local L 3,4.  #24ga Pencan into clear CSF L 3,4.  15mg  0.75% Bupivacaine with dextrose injected with asp CSF beginning and end of injection.  Patient asymptomatic, VSS, no heme aspirated, tolerated well.  Jenita Seashore, MD

## 2020-05-20 NOTE — Anesthesia Procedure Notes (Signed)
Anesthesia Regional Block: Adductor canal block   Pre-Anesthetic Checklist: ,, timeout performed, Correct Patient, Correct Site, Correct Laterality, Correct Procedure, Correct Position, site marked, Risks and benefits discussed,  Surgical consent,  Pre-op evaluation,  At surgeon's request and post-op pain management  Laterality: Left and Lower  Prep: chloraprep       Needles:   Needle Type: Echogenic Needle     Needle Length: 9cm  Needle Gauge: 21     Additional Needles:   Procedures:,,,, ultrasound used (permanent image in chart),,,,  Narrative:  Start time: 05/20/2020 10:39 AM End time: 05/20/2020 10:45 AM Injection made incrementally with aspirations every 5 mL.  Performed by: Personally  Anesthesiologist: Annye Asa, MD  Additional Notes: Pt identified in Holding room.  Monitors applied. Working IV access confirmed. Sterile prep L thigh.  #21ga ECHOgenic block needle into adductor canal with US guidance.  20cc 0.75% Ropivacaine injected incrementally after negative test dose.  Patient asymptomatic, VSS, no heme aspirated, tolerated well.  Jenita Seashore, MD

## 2020-05-20 NOTE — Brief Op Note (Signed)
05/20/2020  12:27 PM  PATIENT:  Eric Delgado  65 y.o. male  PRE-OPERATIVE DIAGNOSIS:  osteoarthritis left knee  POST-OPERATIVE DIAGNOSIS:  osteoarthritis left knee  PROCEDURE:  Procedure(s): LEFT TOTAL KNEE ARTHROPLASTY (Left)  SURGEON:  Surgeon(s) and Role:    Mcarthur Rossetti, MD - Primary  PHYSICIAN ASSISTANT:  Benita Stabile, PA-C  ANESTHESIA:   regional and spinal  EBL:  100 mL   COUNTS:  YES  TOURNIQUET:   Total Tourniquet Time Documented: Thigh (Left) - 49 minutes Total: Thigh (Left) - 49 minutes   DICTATION: .Other Dictation: Dictation Number [id number]  PLAN OF CARE: Admit for overnight observation  PATIENT DISPOSITION:  PACU - hemodynamically stable.   Delay start of Pharmacological VTE agent (>24hrs) due to surgical blood loss or risk of bleeding: no

## 2020-05-20 NOTE — Anesthesia Preprocedure Evaluation (Addendum)
Anesthesia Evaluation  Patient identified by MRN, date of birth, ID band Patient awake    Reviewed: Allergy & Precautions, NPO status , Patient's Chart, lab work & pertinent test results, reviewed documented beta blocker date and time   History of Anesthesia Complications Negative for: history of anesthetic complications  Airway Mallampati: II  TM Distance: >3 FB Neck ROM: Full    Dental  (+) Dental Advisory Given   Pulmonary COPD, former smoker,  05/16/2020 SARS coronavirus NEG   breath sounds clear to auscultation       Cardiovascular hypertension, Pt. on medications + angina  Rhythm:Regular Rate:Normal     Neuro/Psych negative neurological ROS     GI/Hepatic negative GI ROS, Neg liver ROS,   Endo/Other  obese  Renal/GU negative Renal ROS     Musculoskeletal  (+) Arthritis , Osteoarthritis,    Abdominal (+) + obese,   Peds  Hematology negative hematology ROS (+)   Anesthesia Other Findings   Reproductive/Obstetrics                            Anesthesia Physical Anesthesia Plan  ASA: III  Anesthesia Plan: Spinal   Post-op Pain Management:  Regional for Post-op pain   Induction:   PONV Risk Score and Plan: 1 and Ondansetron and Treatment may vary due to age or medical condition  Airway Management Planned: Natural Airway and Simple Face Mask  Additional Equipment: None  Intra-op Plan:   Post-operative Plan:   Informed Consent: I have reviewed the patients History and Physical, chart, labs and discussed the procedure including the risks, benefits and alternatives for the proposed anesthesia with the patient or authorized representative who has indicated his/her understanding and acceptance.     Dental advisory given  Plan Discussed with: CRNA and Surgeon  Anesthesia Plan Comments: (SAB with adductor canal block for post op analgesia)       Anesthesia Quick  Evaluation

## 2020-05-20 NOTE — Anesthesia Postprocedure Evaluation (Signed)
Anesthesia Post Note  Patient: Eric Delgado  Procedure(s) Performed: LEFT TOTAL KNEE ARTHROPLASTY (Left Knee)     Patient location during evaluation: PACU Anesthesia Type: Spinal Level of consciousness: awake and alert, patient cooperative and oriented Pain management: pain level controlled Vital Signs Assessment: post-procedure vital signs reviewed and stable Respiratory status: nonlabored ventilation, spontaneous breathing and respiratory function stable Cardiovascular status: stable and blood pressure returned to baseline Postop Assessment: no apparent nausea or vomiting, spinal receding and patient able to bend at knees Anesthetic complications: no   No complications documented.  Last Vitals:  Vitals:   05/20/20 1408 05/20/20 1415  BP: 103/81   Pulse: (!) 57 (!) 56  Resp: 11 10  Temp:    SpO2: 96% 98%    Last Pain:  Vitals:   05/20/20 1415  TempSrc:   PainSc: Asleep                 Osmany Azer,E. Maniya Donovan

## 2020-05-20 NOTE — Op Note (Signed)
NAME: Eric, Delgado MEDICAL RECORD NO: [id number] ACCOUNT NO: [id number] DATE OF BIRTH: 1956/01/10 FACILITY: [facility] LOCATION: [facility] PHYSICIAN: Lind Guest. Ninfa Linden, MD  Operative Report   DATE OF PROCEDURE: 05/20/2020  PREOPERATIVE DIAGNOSIS:  Primary osteoarthritis and degenerative joint disease, left knee.  POSTOPERATIVE DIAGNOSIS:  Primary osteoarthritis and degenerative joint disease, left knee.  PROCEDURE:  Left total knee arthroplasty.  IMPLANTS:  Stryker Triathlon press-fit knee system with size 5 femur, size 4 tibial tray, 11 mm fixed bearing polyethylene insert, size 32 patellar button.  SURGEON:  Lind Guest. Ninfa Linden, MD  ASSISTANT:  Erskine Emery, PA-C  ANESTHESIA: 1.  Left lower extremity adductor canal block. 2.  Spinal.  TOURNIQUET TIME:  Less than 1 hour.  ANTIBIOTICS:  2 g IV Ancef.  BLOOD LOSS:  Less than 092 mL  COMPLICATIONS:  None.  INDICATIONS:  The patient is a 65 year old gentleman with well-documented debilitating arthritis involving his left knee.  He has had recurrent effusions and daily pain.  It is detrimentally affecting his mobility, his quality of life and his activities  of daily living to the point he does wish to proceed with a total knee arthroplasty.  We have recommended that to him as well.  We talked about the risk of acute blood loss anemia, nerve or vessel injury, fracture, infection, DVT, and implant failure.   We talked about our goals being decreased pain, improved mobility and overall improved quality of life.  DESCRIPTION OF PROCEDURE:  After informed consent was obtained, appropriate left knee was marked.  An adductor canal block of the left lower extremity was obtained in the holding room.  He was then brought to the operating room and sat up on the  operating table where spinal anesthesia was obtained, he was laid in supine position on the operating table.  Foley catheter was placed and a nonsterile tourniquet was placed  around his upper left thigh.  His left thigh, knee, leg, ankle and foot were  prepped and draped in DuraPrep and sterile drapes.  Sterile stockinette was placed as well.  A timeout was called and he was identified correct patient, correct left knee.  We then used an Esmarch to wrap that leg and tourniquet was inflated to 300 mm of  pressure.  We then made a direct midline incision over the patella and carried this proximally and distally.  We dissected down the knee joint and carried out a medial parapatellar arthrotomy, finding a very large joint effusion and significant  synovitis in the knee.  With the knee in a flexed position, we removed remnants of ACL, PCL, medial and lateral meniscus and periarticular osteophytes around the knee.  There was significant loss that was full thickness on the medial aspect of his knee  in the patellofemoral joint.  With the extramedullary cutting guide with the knee in a flexed position, we set our proximal tibia cut for taking 9 mm off the high side and correcting for varus and valgus and neutral slope.  We made this cut without  difficulty.  We then went back to the femur and used the intramedullary guide for distal femoral cut, setting this for a left knee at 5 degrees externally rotated and a 10 mm distal femoral cut.  We made this cut without difficulty, and brought the knee  back down to full extension and with a 9 mm extension block we achieved almost full extension.  We then went back to the femur and put our femoral sizing guide based  off the epicondylar axis.  Based off of this, we chose a size 5 femur.  We put a 4-in-1  cutting block for a size 5 femur and made our anterior and posterior cuts, followed by our chamfer cuts.  We then made our femoral box cut for a size 5 femur.  We then went back to the tibia and chose a size 4 tibial tray for coverage of the tibial  plateau setting the rotation off the tibial tubercle and the femur.  Based off of this, we chose  again the size 4 tibial trial and we did our keel punch off of this for again a press-fit knee system given the good quality of his bone and his young age.   With a size 4 tibial tray trial we trialed a size 5 left femur.  We trialed a 9 mm fixed bearing insert and then 11 mm insert and we felt good with stability, and range of motion with 11 mm insert.  We then made our patellar cut and drilled three holes  for a size 32 press-fit patellar button.  With all trials in placed in the knee I put him through several cycles of range of motion.  We were pleased with motion and stability.  We then removed all instrumentation from the knee and irrigated the knee  with normal saline solution using pulsatile lavage.  We dried the knee real well and then with the knee in a flexed position, placed our real Stryker Triathlon tibial tray, size 4.  It was press fit, followed by a press-fit left size 5 femur.  We  press-fit our patellar button and did put our polyethylene liner, which was the real 11 mm fixed bearing liner for the size 4 tibial tray and the knee.  We then put him through several cycles of motion.  We were pleased with range of motion and  stability.  The tourniquet was let down.  Hemostasis obtained with electrocautery.  We closed our arthrotomy with interrupted #1 Vicryl suture followed by 0 Vicryl in the deep tissue and 2-0 Vicryl in subcutaneous tissue.  The skin was closed with  staples.  A well-padded sterile dressing was applied.  He was taken to recovery room in stable condition with all final counts being correct.  No complications noted.  Of note, Benita Stabile, PA-C did assist during the entire case and his assistance was  crucial for facilitating all aspects of this case.   PUS D: 05/20/2020 12:25:36 pm T: 05/20/2020 4:00:00 pm  JOB: [id number]/ [id number]

## 2020-05-20 NOTE — H&P (Signed)
TOTAL KNEE ADMISSION H&P  Patient is being admitted for left total knee arthroplasty.  Subjective:  Chief Complaint:left knee pain.  HPI: Eric Delgado, 65 y.o. male, has a history of pain and functional disability in the left knee due to arthritis and has failed non-surgical conservative treatments for greater than 12 weeks to includeNSAID's and/or analgesics, corticosteriod injections, flexibility and strengthening excercises and activity modification.  Onset of symptoms was gradual, starting 3 years ago with gradually worsening course since that time. The patient noted no past surgery on the left knee(s).  Patient currently rates pain in the left knee(s) at 10 out of 10 with activity. Patient has night pain, worsening of pain with activity and weight bearing, pain that interferes with activities of daily living, pain with passive range of motion, crepitus and joint swelling.  Patient has evidence of subchondral sclerosis, periarticular osteophytes and joint space narrowing by imaging studies. There is no active infection.  Patient Active Problem List   Diagnosis Date Noted  . Unilateral primary osteoarthritis, left knee 04/07/2020  . Status post arthroscopy of left knee 12/12/2019  . Other tear of medial meniscus, current injury, left knee, subsequent encounter 11/14/2019  . Obesity, Class I, BMI 30-34.9 07/31/2016  . Chronic insomnia 07/31/2016  . Osteoarthritis, multiple sites 12/21/2013  . Status post total replacement of left hip 12/21/2013  . Routine health maintenance 10/16/2010  . Ex-smoker 10/16/2010  . HLD (hyperlipidemia) 12/17/2006  . Irritability 12/17/2006  . Essential hypertension 12/17/2006  . Carpal tunnel syndrome 12/17/2006   Past Medical History:  Diagnosis Date  . Arthritis   . Difficulty sleeping    takes xanax for sleep  . History of hiatal hernia   . Hyperlipidemia   . Hypertension   . SCIATICA, RIGHT 10/12/2007   Qualifier: Diagnosis of  By: Linda Hedges MD,  Heinz Knuckles     Past Surgical History:  Procedure Laterality Date  . APPENDECTOMY  1975  . CATARACT EXTRACTION Bilateral 02/2015   [facility]  . COLONOSCOPY  10/2010   WNL, rpt 10 yrs (Dr. Glennon [facility])  . ROTATOR CUFF REPAIR     left  . TOTAL HIP ARTHROPLASTY Right Spring '12   Dr. Alvan Dame - ceramic ball, fiber stem  . TOTAL HIP ARTHROPLASTY Left 12/21/2013   Procedure: LEFT TOTAL HIP ARTHROPLASTY ANTERIOR APPROACH;  Surgeon: Mcarthur Rossetti, MD;  Location: [facility];  Service: Orthopedics;  Laterality: Left;  Marland Kitchen VASECTOMY      Current Facility-Administered Medications  Medication Dose Route Frequency Provider Last Rate Last Admin  . acetaminophen (TYLENOL) tablet 1,000 mg  1,000 mg Oral Once Annye Asa, MD      . ceFAZolin (ANCEF) IVPB 2g/100 mL premix  2 g Intravenous On Call to OR Pete Pelt, PA-C      . fentaNYL (SUBLIMAZE) 100 MCG/2ML injection           . lactated ringers infusion   Intravenous Continuous Ellender, Karyl Kinnier, MD 10 mL/hr at 05/20/20 1025 Continued from Pre-op at 05/20/20 1025  . midazolam (VERSED) 2 MG/2ML injection           . tranexamic acid (CYKLOKAPRON) IVPB 1,000 mg  1,000 mg Intravenous To OR Pete Pelt, PA-C       Allergies  Allergen Reactions  . Prozac [Fluoxetine Hcl] Other (See Comments)    Didn't feel well with this  . Tramadol     Sick on Stomach + blood pressure goes up.  Social History   Tobacco Use  . Smoking status: Former Smoker    Packs/day: 0.50    Years: 30.00    Pack years: 15.00    Types: Cigarettes    Quit date: 01/15/2010    Years since quitting: 10.3  . Smokeless tobacco: Never Used  Substance Use Topics  . Alcohol use: Yes    Comment: occ    Family History  Problem Relation Age of Onset  . Macular degeneration Mother   . Hypertension Mother   . Hyperlipidemia Mother   . Cancer Mother 3       lung (small cell), smoker   . Stroke Maternal Aunt   . Diabetes Neg Hx    . CAD Neg Hx      Review of Systems  Musculoskeletal: Positive for gait problem and joint swelling.  All other systems reviewed and are negative.   Objective:  Physical Exam Vitals reviewed.  Constitutional:      Appearance: Normal appearance.  HENT:     Head: Normocephalic and atraumatic.  Eyes:     Extraocular Movements: Extraocular movements intact.     Pupils: Pupils are equal, round, and reactive to light.  Cardiovascular:     Rate and Rhythm: Normal rate.     Pulses: Normal pulses.  Pulmonary:     Effort: Pulmonary effort is normal.  Abdominal:     Palpations: Abdomen is soft.  Musculoskeletal:     Cervical back: Normal range of motion.     Left knee: Effusion and bony tenderness present. Decreased range of motion. Tenderness present over the medial joint line, lateral joint line and patellar tendon. Abnormal alignment and abnormal meniscus.  Neurological:     Mental Status: He is alert and oriented to person, place, and time.  Psychiatric:        Behavior: Behavior normal.     Vital signs in last 24 hours: Temp:  [98.1 F (36.7 C)] 98.1 F (36.7 C) (04/05 1017) Pulse Rate:  [85] 85 (04/05 1013) Resp:  [15] 15 (04/05 1013) BP: (144)/(72) 144/72 (04/05 1013) SpO2:  [97 %] 97 % (04/05 1017) Weight:  [95.3 kg] 95.3 kg (04/05 1017)  Labs:   Estimated body mass index is 31.01 kg/m as calculated from the following:   Height as of this encounter: 5\' 9"  (1.753 m).   Weight as of this encounter: 95.3 kg.   Imaging Review Plain radiographs demonstrate severe degenerative joint disease of the left knee(s). The overall alignment ismild varus. The bone quality appears to be excellent for age and reported activity level.      Assessment/Plan:  End stage arthritis, left knee   The patient history, physical examination, clinical judgment of the provider and imaging studies are consistent with end stage degenerative joint disease of the left knee(s) and total  knee arthroplasty is deemed medically necessary. The treatment options including medical management, injection therapy arthroscopy and arthroplasty were discussed at length. The risks and benefits of total knee arthroplasty were presented and reviewed. The risks due to aseptic loosening, infection, stiffness, patella tracking problems, thromboembolic complications and other imponderables were discussed. The patient acknowledged the explanation, agreed to proceed with the plan and consent was signed. Patient is being admitted for inpatient treatment for surgery, pain control, PT, OT, prophylactic antibiotics, VTE prophylaxis, progressive ambulation and ADL's and discharge planning. The patient is planning to be discharged home with home health services

## 2020-05-20 NOTE — Evaluation (Signed)
Physical Therapy Evaluation Patient Details Name: Eric Delgado MRN: [id number] DOB: 15-Mar-1955 Today's Date: 05/20/2020   History of Present Illness  Pt is a 65 y/o male s/p L TKA. PMH includes HTN and bilateral THA.  Clinical Impression  Pt admitted secondary to problem above with deficits below. Pt with effects of nerve block in BLE, so mobility limited to chair. Requiring min A for steadying to get to chair. Reviewed knee precautions with pt. Will continue to follow acutely.     Follow Up Recommendations Follow surgeon's recommendation for DC plan and follow-up therapies    Equipment Recommendations  None recommended by PT    Recommendations for Other Services       Precautions / Restrictions Precautions Precautions: Knee Precaution Booklet Issued: No Precaution Comments: Verbally reviewed knee precautions. Restrictions Weight Bearing Restrictions: Yes LLE Weight Bearing: Weight bearing as tolerated      Mobility  Bed Mobility Overal bed mobility: Needs Assistance Bed Mobility: Supine to Sit     Supine to sit: Min assist     General bed mobility comments: Min A for LLE assist.    Transfers Overall transfer level: Needs assistance Equipment used: Rolling walker (2 wheeled) Transfers: Sit to/from [facility] Sit to Stand: Min assist Stand pivot transfers: Min assist       General transfer comment: Min A for lift assist and steadying to stand and transfer to chair. Heavy reliance on BUE.  Pt with mild instability throughout BLE secondary to effects of block, so further mobility deferred.  Ambulation/Gait                Stairs            Wheelchair Mobility    Modified Rankin (Stroke Patients Only)       Balance Overall balance assessment: Needs assistance Sitting-balance support: No upper extremity supported;Feet supported Sitting balance-Leahy Scale: Fair     Standing balance support: Bilateral upper extremity  supported Standing balance-Leahy Scale: Poor Standing balance comment: Reliant on BUE support                             Pertinent Vitals/Pain Pain Assessment: Faces Faces Pain Scale: Hurts little more Pain Location: L knee Pain Descriptors / Indicators: Aching;Operative site guarding Pain Intervention(s): Limited activity within patient's tolerance;Monitored during session;Repositioned    Home Living Family/patient expects to be discharged to:: Private residence Living Arrangements: Spouse/significant other Available Help at Discharge: Family Type of Home: House Home Access: Stairs to enter Entrance Stairs-Rails: None Entrance Stairs-Number of Steps: 1 Home Layout: Two level;Able to live on main level with bedroom/bathroom Home Equipment: Gilford Rile - 2 wheels;Walker - 4 wheels;Cane - single point;Bedside commode      Prior Function Level of Independence: Independent               Hand Dominance        Extremity/Trunk Assessment   Upper Extremity Assessment Upper Extremity Assessment: Defer to OT evaluation    Lower Extremity Assessment Lower Extremity Assessment: LLE deficits/detail;RLE deficits/detail RLE Deficits / Details: Reporting tingling/numbness throughout RLE. was able to perform quad set LLE Deficits / Details: Deficits consistent with post op pain and weakness. Reporting numbness in LLE.    Cervical / Trunk Assessment Cervical / Trunk Assessment: Normal  Communication   Communication: No difficulties  Cognition Arousal/Alertness: Awake/alert Behavior During Therapy: WFL for tasks assessed/performed Overall Cognitive Status: Within Functional Limits for tasks assessed  General Comments      Exercises     Assessment/Plan    PT Assessment Patient needs continued PT services  PT Problem List Decreased strength;Decreased range of motion;Decreased activity tolerance;Decreased  balance;Decreased mobility;Impaired sensation;Decreased coordination;Decreased knowledge of use of DME;Decreased knowledge of precautions       PT Treatment Interventions DME instruction;Gait training;Functional mobility training;Stair training;Therapeutic activities;Therapeutic exercise;Balance training;Patient/family education    PT Goals (Current goals can be found in the Care Plan section)  Acute Rehab PT Goals Patient Stated Goal: to go home PT Goal Formulation: With patient Time For Goal Achievement: 06/03/20 Potential to Achieve Goals: Good    Frequency 7X/week   Barriers to discharge        Co-evaluation               AM-PAC PT "6 Clicks" Mobility  Outcome Measure Help needed turning from your back to your side while in a flat bed without using bedrails?: A Little Help needed moving from lying on your back to sitting on the side of a flat bed without using bedrails?: A Little Help needed moving to and from a bed to a chair (including a wheelchair)?: A Little Help needed standing up from a chair using your arms (e.g., wheelchair or bedside chair)?: A Little Help needed to walk in hospital room?: A Lot Help needed climbing 3-5 steps with a railing? : A Lot 6 Click Score: 16    End of Session Equipment Utilized During Treatment: Gait belt;Left knee immobilizer Activity Tolerance: Patient tolerated treatment well Patient left: in chair;with call bell/phone within reach Nurse Communication: Mobility status PT Visit Diagnosis: Muscle weakness (generalized) (M62.81);Other abnormalities of gait and mobility (R26.89);Pain Pain - Right/Left: Left Pain - part of body: Knee    Time: 1540-1600 PT Time Calculation (min) (ACUTE ONLY): 20 min   Charges:   PT Evaluation $PT Eval Low Complexity: 1 Low          Lou Miner, DPT  Acute Rehabilitation Services  Pager: [phone] Office: [phone]   Rudean Hitt 05/20/2020, 4:28 PM

## 2020-05-21 ENCOUNTER — Encounter (HOSPITAL_COMMUNITY): Payer: Self-pay | Admitting: Orthopaedic Surgery

## 2020-05-21 DIAGNOSIS — Z79899 Other long term (current) drug therapy: Secondary | ICD-10-CM | POA: Diagnosis not present

## 2020-05-21 DIAGNOSIS — Z87891 Personal history of nicotine dependence: Secondary | ICD-10-CM | POA: Diagnosis not present

## 2020-05-21 DIAGNOSIS — M1712 Unilateral primary osteoarthritis, left knee: Secondary | ICD-10-CM | POA: Diagnosis not present

## 2020-05-21 DIAGNOSIS — I10 Essential (primary) hypertension: Secondary | ICD-10-CM | POA: Diagnosis not present

## 2020-05-21 LAB — CBC
HCT: 33.8 % — ABNORMAL LOW (ref 39.0–52.0)
Hemoglobin: 11.8 g/dL — ABNORMAL LOW (ref 13.0–17.0)
MCH: 30.3 pg (ref 26.0–34.0)
MCHC: 34.9 g/dL (ref 30.0–36.0)
MCV: 86.7 fL (ref 80.0–100.0)
Platelets: 254 10*3/uL (ref 150–400)
RBC: 3.9 MIL/uL — ABNORMAL LOW (ref 4.22–5.81)
RDW: 13.6 % (ref 11.5–15.5)
WBC: 13.4 10*3/uL — ABNORMAL HIGH (ref 4.0–10.5)
nRBC: 0 % (ref 0.0–0.2)

## 2020-05-21 LAB — BASIC METABOLIC PANEL
Anion gap: 8 (ref 5–15)
BUN: 25 mg/dL — ABNORMAL HIGH (ref 8–23)
CO2: 26 mmol/L (ref 22–32)
Calcium: 9 mg/dL (ref 8.9–10.3)
Chloride: 97 mmol/L — ABNORMAL LOW (ref 98–111)
Creatinine, Ser: 1.09 mg/dL (ref 0.61–1.24)
GFR, Estimated: >60 mL/min (ref >60)
Glucose, Bld: 125 mg/dL — ABNORMAL HIGH (ref 70–99)
Potassium: 4 mmol/L (ref 3.5–5.1)
Sodium: 131 mmol/L — ABNORMAL LOW (ref 135–145)

## 2020-05-21 MED ORDER — TIZANIDINE HCL 4 MG PO TABS: 4.0000 mg | ORAL_TABLET | Freq: Four times a day (QID) | ORAL | 1 refills | Status: DC | PRN

## 2020-05-21 MED ORDER — ASPIRIN 81 MG PO CHEW: 81.0000 mg | CHEWABLE_TABLET | Freq: Two times a day (BID) | ORAL | 0 refills | Status: DC

## 2020-05-21 MED ORDER — HYDROMORPHONE HCL 2 MG PO TABS: 2.0000 mg | ORAL_TABLET | ORAL | 0 refills | Status: DC | PRN

## 2020-05-21 NOTE — Discharge Summary (Signed)
Patient ID: Eric Delgado MRN: [id number] DOB/AGE: 1955/03/12 65 y.o.  Admit date: 05/20/2020 Discharge date: 05/21/2020  Admission Diagnoses:  Principal Problem:   Unilateral primary osteoarthritis, left knee Active Problems:   Status post left knee replacement   Discharge Diagnoses:  Same  Past Medical History:  Diagnosis Date  . Arthritis   . Difficulty sleeping    takes xanax for sleep  . History of hiatal hernia   . Hyperlipidemia   . Hypertension   . SCIATICA, RIGHT 10/12/2007   Qualifier: Diagnosis of  By: Linda Hedges MD, Heinz Knuckles     Surgeries: Procedure(s): LEFT TOTAL KNEE ARTHROPLASTY on 05/20/2020   Consultants:   Discharged Condition: Improved  Hospital Course: Eric Delgado is an 65 y.o. male who was admitted 05/20/2020 for operative treatment ofUnilateral primary osteoarthritis, left knee. Patient has severe unremitting pain that affects sleep, daily activities, and work/hobbies. After pre-op clearance the patient was taken to the operating room on 05/20/2020 and underwent  Procedure(s): LEFT TOTAL KNEE ARTHROPLASTY.    Patient was given perioperative antibiotics:  Anti-infectives (From admission, onward)   Start     Dose/Rate Route Frequency Ordered Stop   05/20/20 1645  ceFAZolin (ANCEF) IVPB 1 g/50 mL premix        1 g 100 mL/hr over 30 Minutes Intravenous Every 6 hours 05/20/20 1509 05/20/20 2223   05/20/20 1000  ceFAZolin (ANCEF) IVPB 2g/100 mL premix        2 g 200 mL/hr over 30 Minutes Intravenous On call to O.R. 05/20/20 0959 05/20/20 1110       Patient was given sequential compression devices, early ambulation, and chemoprophylaxis to prevent DVT.  Patient benefited maximally from hospital stay and there were no complications.    Recent vital signs:  Patient Vitals for the past 24 hrs:  BP Temp Temp src Pulse Resp SpO2 Height Weight  05/21/20 0728 (!) 151/83 98.3 F (36.8 C) Oral 84 18 98 % -- --  05/21/20 0331 (!) 140/94 98 F (36.7 C) Oral  82 20 97 % -- --  05/21/20 0331 (!) 140/94 98 F (36.7 C) Oral 80 20 96 % -- --  05/20/20 2327 134/82 97.9 F (36.6 C) Oral 77 20 97 % -- --  05/20/20 1923 (!) 145/76 97.9 F (36.6 C) Oral 94 20 97 % -- --  05/20/20 1514 129/87 97.6 F (36.4 C) Oral 60 20 99 % -- --  05/20/20 1450 125/85 97.8 F (36.6 C) -- 67 11 98 % -- --  05/20/20 1438 115/71 -- -- (!) 58 13 97 % -- --  05/20/20 1423 107/76 -- -- (!) 56 12 93 % -- --  05/20/20 1415 -- -- -- (!) 56 10 98 % -- --  05/20/20 1408 103/81 -- -- (!) 57 11 96 % -- --  05/20/20 1353 119/69 -- -- (!) 56 (!) 9 96 % -- --  05/20/20 1350 -- -- -- 61 10 92 % -- --  05/20/20 1320 108/64 -- -- 63 13 98 % -- --  05/20/20 1252 113/68 97.6 F (36.4 C) -- 79 18 100 % -- --  05/20/20 1017 -- 98.1 F (36.7 C) Oral -- -- 97 % 5\' 9"  (1.753 m) 95.3 kg  05/20/20 1013 (!) 144/72 -- Oral 85 15 -- -- --     Recent laboratory studies:  Recent Labs    05/21/20 0612  WBC 13.4*  HGB 11.8*  HCT 33.8*  PLT 254  NA 131*  K 4.0  CL 97*  CO2 26  BUN 25*  CREATININE 1.09  GLUCOSE 125*  CALCIUM 9.0     Discharge Medications:   Allergies as of 05/21/2020      Reactions   Prozac [fluoxetine Hcl] Other (See Comments)   Didn't feel well with this   Tramadol    Sick on Stomach + blood pressure goes up.       Medication List    STOP taking these medications   aspirin EC 81 MG tablet Replaced by: aspirin 81 MG chewable tablet   HYDROcodone-acetaminophen 5-325 MG tablet Commonly known as: NORCO/VICODIN   ibuprofen 200 MG tablet Commonly known as: ADVIL   nabumetone 750 MG tablet Commonly known as: RELAFEN     TAKE these medications   amLODipine 5 MG tablet Commonly known as: NORVASC Take 1 tablet (5 mg total) by mouth daily.   aspirin 81 MG chewable tablet Chew 1 tablet (81 mg total) by mouth 2 (two) times daily. Replaces: aspirin EC 81 MG tablet   hydrochlorothiazide 25 MG tablet Commonly known as: HYDRODIURIL Take 1 tablet (25 mg  total) by mouth daily.   HYDROmorphone 2 MG tablet Commonly known as: DILAUDID Take 1 tablet (2 mg total) by mouth every 4 (four) hours as needed for severe pain (breakthrough pain).   lisinopril 40 MG tablet Commonly known as: ZESTRIL Take 1 tablet (40 mg total) by mouth daily.   multivitamin with minerals Tabs tablet Take 1 tablet by mouth every morning.   rosuvastatin 20 MG tablet Commonly known as: CRESTOR Take 1 tablet (20 mg total) by mouth at bedtime.   tiZANidine 4 MG tablet Commonly known as: Zanaflex Take 1 tablet (4 mg total) by mouth every 6 (six) hours as needed for muscle spasms.   traZODone 50 MG tablet Commonly known as: DESYREL TAKE 1/2 TO 1 TABLET (25-50 MG TOTAL) BY MOUTH AT BEDTIME AS NEEDED FOR SLEEP. What changed: See the new instructions.            Durable Medical Equipment  (From admission, onward)         Start     Ordered   05/20/20 1510  DME 3 n 1  Once        05/20/20 1509   05/20/20 1510  DME Walker rolling  Once       Question Answer Comment  Walker: With 5 Inch Wheels   Patient needs a walker to treat with the following condition Status post left knee replacement      05/20/20 1509          Diagnostic Studies: DG Knee Left Port  Result Date: 05/20/2020 CLINICAL DATA:  Post total knee replacement. EXAM: PORTABLE LEFT KNEE - 1-2 VIEW COMPARISON:  Preoperative imaging from April 07, 2020 FINDINGS: Skin staples overlie the surgical site along the anterior knee. Gas and swelling of soft tissues about the LEFT knee as expected post total knee arthroplasty. Lateral view mildly limited by obliquity. No immediate complication noted. IMPRESSION: Expected postoperative appearance of LEFT total knee arthroplasty. Electronically Signed   By: Zetta Bills M.D.   On: 05/20/2020 13:54    Disposition: Discharge disposition: 01-Home or [place]    Mcarthur Rossetti, MD Follow up in 2 week(s).    Specialty: Orthopedic Surgery Contact information: [address] [place] [address] [phone]  Signed: Mcarthur Rossetti 05/21/2020, 7:39 AM

## 2020-05-21 NOTE — Progress Notes (Signed)
Patient is discharged from room 3C02 at this time. Alert and in stable condition. IV site d/c'd and instructions read to patient and spouse with understanding verbalized and all questions answered. Left unit via wheelchair with all belongings at side.  ?

## 2020-05-21 NOTE — TOC Initial Note (Signed)
Transition of Care [facility]) - Initial/Assessment Note    Patient Details  Name: Eric Delgado MRN: [id number] Date of Birth: Jun 18, 1955  Transition of Care [facility]) CM/SW Contact:    Joanne Chars, LCSW Phone Number: 05/21/2020, 1:30 PM  Clinical Narrative: Pt is on list for prearranged [facility] services through [place].  CSW met briefly with pt and wife who are in agreement with this plan, had already been contacted yesterday by [facility].  CSW confirmed with [place] at [facility] that pt will DC today.                    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 CMS Medicare.gov Compare Post Acute Care list provided to::  ([place] already arranged through [facility])    Expected Discharge Plan and Services Expected Discharge Plan: [place] In-house Referral: Clinical Social Work     Living arrangements for the past 2 months: Single Family Home Expected Discharge Date: 05/21/20               DME Arranged: N/A (DME through[facility] staff)         HH Arranged: PT [place] Agency: [facility] [employer] Date [place]: 05/21/20 Time [place]: 20 Representative spoke with at [place]: [place]  Prior Living Arrangements/Services Living arrangements for the past 2 months: [place] with:: Spouse              Current home services: Other (comment) (none)    Activities of Daily Living      Permission Sought/Granted                  Emotional Assessment Appearance:: Appears stated age Attitude/Demeanor/Rapport: Engaged Affect (typically observed): Appropriate,Pleasant Orientation: : Oriented to Self,Oriented to Place,Oriented to  Time,Oriented to Situation      Admission diagnosis:  Status post  left knee replacement [Z96.652] Patient Active Problem List   Diagnosis Date Noted  . Status post left knee replacement 05/20/2020  . Unilateral primary osteoarthritis, left knee 04/07/2020  . Status post arthroscopy of left knee 12/12/2019  . Other tear of medial meniscus, current injury, left knee, subsequent encounter 11/14/2019  . Obesity, Class I, BMI 30-34.9 07/31/2016  . Chronic insomnia 07/31/2016  . Osteoarthritis, multiple sites 12/21/2013  . Status post total replacement of left hip 12/21/2013  . Routine health maintenance 10/16/2010  . Ex-smoker 10/16/2010  . HLD (hyperlipidemia) 12/17/2006  . Irritability 12/17/2006  . Essential hypertension 12/17/2006  . Carpal tunnel syndrome 12/17/2006   PCP:  Ria Bush, MD Pharmacy:   [facility], [place] [address], [place] [address] Phone: [phone] Fax: [phone]  [id number] [id number]: [phone]: [phone]    Social Determinants of Health (SDOH) Interventions    Readmission Risk Interventions No flowsheet data found.

## 2020-05-21 NOTE — Progress Notes (Signed)
Physical Therapy Treatment Patient Details Name: YOLANDA DOCKENDORF MRN: [id number] DOB: Jun 28, 1955 Today's Date: 05/21/2020    History of Present Illness Pt is a 65 y/o male s/p L TKA. PMH includes HTN and bilateral THA.    PT Comments    Continuing work on functional mobility and activity tolerance;  Session focused on gait and stari training in prep for dc home; Questions solicited and answered; OK for dc home from PT standpoint    Follow Up Recommendations  Follow surgeon's recommendation for DC plan and follow-up therapies     Equipment Recommendations  None recommended by PT    Recommendations for Other Services       Precautions / Restrictions Precautions Precautions: Knee Precaution Booklet Issued: Yes (comment) Precaution Comments: Verbally reviewed knee precautions.  Pt able to describe optimal knee positioning to his significant other correctly Required Braces or Orthoses:  (KI in room) Knee Immobilizer - Right: Other (comment) (does not need KI fo rknee stability) Restrictions LLE Weight Bearing: Weight bearing as tolerated    Mobility  Bed Mobility Overal bed mobility: Modified Independent Bed Mobility: Supine to Sit           General bed mobility comments: Slow moving and used rails, but di dnot need physical assist to get up; used REL to suport LLE coming off the bed    Transfers Overall transfer level: Needs assistance Equipment used: Rolling walker (2 wheeled) Transfers: Sit to/from [facility] Sit to Stand: Supervision         General transfer comment: Cues for hand placement; no need for physical assist  Ambulation/Gait Ambulation/Gait assistance: Min guard (with and without physical contact) Gait Distance (Feet): 120 Feet Assistive device: Rolling walker (2 wheeled) Gait Pattern/deviations: Step-through pattern     General Gait Details: Able to walk in the hallways without KI and no noteworthy L knee buckle in stance;  multimodal cues for posture, and to allow for knee flexion during L swing   Stairs Stairs: Yes Stairs assistance: Min assist Stair Management: No rails;Backwards;With walker Number of Stairs: 3 General stair comments: Pt's wife present to learn technqiue and provided correct assist   Wheelchair Mobility    Modified Rankin (Stroke Patients Only)       Balance     Sitting balance-Leahy Scale: Good       Standing balance-Leahy Scale: Poor Standing balance comment: Reliant on BUE support                            Cognition Arousal/Alertness: Awake/alert Behavior During Therapy: WFL for tasks assessed/performed Overall Cognitive Status: Within Functional Limits for tasks assessed                                        Exercises Total Joint Exercises Ankle Circles/Pumps: AROM;Both;5 reps Quad Sets: AROM;Right;Left;10 reps (notable hamstring co-contraction LLE) Short Arc Quad: AAROM;Left;10 reps Knee Flexion: Seated;AROM;Left;5 reps (Cues for ways to progress himself) Goniometric ROM: approx 4-60 degrees    General Comments General comments (skin integrity, edema, etc.): Lengthy discussion re: PT follow up; Pt does not have rides for Outpt PT follow up, so HHPT follow up is appropriate -- Per SW, pt is already setup; pt tell me he has a stationary bike at home; I encouraged him to start with it early with HHPT's direction  Pertinent Vitals/Pain Pain Assessment: Faces Faces Pain Scale: Hurts little more Pain Location: L knee Pain Descriptors / Indicators: Grimacing;Guarding Pain Intervention(s): Monitored during session    Home Living                      Prior Function            PT Goals (current goals can now be found in the care plan section) Acute Rehab PT Goals Patient Stated Goal: To move better and hurt less PT Goal Formulation: With patient Time For Goal Achievement: 06/03/20 Potential to Achieve Goals:  Good Progress towards PT goals: Progressing toward goals    Frequency    7X/week      PT Plan Current plan remains appropriate    Co-evaluation              AM-PAC PT "6 Clicks" Mobility   Outcome Measure  Help needed turning from your back to your side while in a flat bed without using bedrails?: None Help needed moving from lying on your back to sitting on the side of a flat bed without using bedrails?: None Help needed moving to and from a bed to a chair (including a wheelchair)?: A Little Help needed standing up from a chair using your arms (e.g., wheelchair or bedside chair)?: A Little Help needed to walk in hospital room?: None Help needed climbing 3-5 steps with a railing? : A Little 6 Click Score: 21    End of Session Equipment Utilized During Treatment: Gait belt Activity Tolerance: Patient tolerated treatment well Patient left: in bed;with call bell/phone within reach;with family/visitor present;Other (comment) (Preparing for discharge) Nurse Communication: Mobility status PT Visit Diagnosis: Muscle weakness (generalized) (M62.81);Other abnormalities of gait and mobility (R26.89);Pain Pain - Right/Left: Left Pain - part of body: Knee     Time: 1310-1335 PT Time Calculation (min) (ACUTE ONLY): 25 min  Charges:  $Gait Training: 23-37 mins $Therapeutic Exercise: 8-22 mins                     Roney Marion, PT  Acute Rehabilitation Services Pager [phone] Office [place] 05/21/2020, 2:13 PM

## 2020-05-21 NOTE — Progress Notes (Signed)
Subjective: 1 Day Post-Op Procedure(s) (LRB): LEFT TOTAL KNEE ARTHROPLASTY (Left) Patient reports pain as moderate.    Objective: Vital signs in last 24 hours: Temp:  [97.6 F (36.4 C)-98.3 F (36.8 C)] 98.3 F (36.8 C) (04/06 0728) Pulse Rate:  [56-94] 84 (04/06 0728) Resp:  [9-20] 18 (04/06 0728) BP: (103-151)/(64-94) 151/83 (04/06 0728) SpO2:  [92 %-100 %] 98 % (04/06 0728) Weight:  [95.3 kg] 95.3 kg (04/05 1017)  Intake/Output from previous day: 04/05 0701 - 04/06 0700 In: 1350 [I.V.:1150; IV Piggyback:200] Out: 2250 [Urine:2150; Blood:100] Intake/Output this shift: No intake/output data recorded.  Recent Labs    05/21/20 0612  HGB 11.8*   Recent Labs    05/21/20 0612  WBC 13.4*  RBC 3.90*  HCT 33.8*  PLT 254   Recent Labs    05/21/20 0612  NA 131*  K 4.0  CL 97*  CO2 26  BUN 25*  CREATININE 1.09  GLUCOSE 125*  CALCIUM 9.0   No results for input(s): LABPT, INR in the last 72 hours.  Sensation intact distally Intact pulses distally Dorsiflexion/Plantar flexion intact Incision: dressing C/D/I No cellulitis present Compartment soft   Assessment/Plan: 1 Day Post-Op Procedure(s) (LRB): LEFT TOTAL KNEE ARTHROPLASTY (Left) Up with therapy Discharge home with home health    Patient's anticipated LOS is less than 2 midnights, meeting these requirements: - Younger than 95 - Lives within 1 hour of care - Has a competent adult at home to recover with post-op recover - NO history of  - Chronic pain requiring opiods  - Diabetes  - Coronary Artery Disease  - Heart failure  - Heart attack  - Stroke  - DVT/VTE  - Cardiac arrhythmia  - Respiratory Failure/COPD  - Renal failure  - Anemia  - Advanced Liver disease       Mcarthur Rossetti 05/21/2020, 7:36 AM

## 2020-05-21 NOTE — Evaluation (Signed)
Occupational Therapy Evaluation Patient Details Name: Eric Delgado MRN: [id number] DOB: 1955/07/22 Today's Date: 05/21/2020    History of Present Illness Pt is a 65 y/o male s/p L TKA. PMH includes HTN and bilateral THA.   Clinical Impression   Patient admitted for the above diagnosis and surgery.  Patient stating MD advised no ROM to the L knee with immobilizer in place.  OT to confer with PT and try to follow up with MD to ensure of AROM requirements.  With the [place] on, he is having expected difficulties with LB ADL, but has the needed DME and assist to care for himself.  No further OT needs in the acute setting.  Patient will need some from of PT post acute to advance his ROM and mobility.      Follow Up Recommendations  No OT follow up    Equipment Recommendations  Other (comment) (LH Sponge)    Recommendations for Other Services       Precautions / Restrictions Precautions Precautions: Knee Precaution Booklet Issued: No Required Braces or Orthoses: Knee Immobilizer - Right Knee Immobilizer - Right: Other (comment) (Patient stating he is not to move the knee) Restrictions Weight Bearing Restrictions: Yes LLE Weight Bearing: Weight bearing as tolerated      Mobility Bed Mobility Overal bed mobility: Modified Independent               Patient Response: Anxious  Transfers Overall transfer level: Modified independent Equipment used: Rolling walker (2 wheeled)                  Balance Overall balance assessment: Needs assistance Sitting-balance support: No upper extremity supported;Feet supported Sitting balance-Leahy Scale: Good     Standing balance support: Bilateral upper extremity supported Standing balance-Leahy Scale: Poor Standing balance comment: Reliant on BUE support                           ADL either performed or assessed with clinical judgement   ADL                                         General ADL  Comments: Patient is requiring minimal assist to donn items over his L foot.  He has a LH Reacher and understandsits use.  He also will have assist as needed from his spouse.     Vision Baseline Vision/History: No visual deficits Patient Visual Report: No change from baseline       Perception     Praxis      Pertinent Vitals/Pain Faces Pain Scale: Hurts whole lot Pain Location: L knee Pain Descriptors / Indicators: Aching;Operative site guarding;Tightness;Sharp Pain Intervention(s): Monitored during session     Hand Dominance Right   Extremity/Trunk Assessment Upper Extremity Assessment Upper Extremity Assessment: Overall WFL for tasks assessed   Lower Extremity Assessment Lower Extremity Assessment: Defer to PT evaluation   Cervical / Trunk Assessment Cervical / Trunk Assessment: Normal   Communication Communication Communication: No difficulties   Cognition Arousal/Alertness: Awake/alert Behavior During Therapy: WFL for tasks assessed/performed Overall Cognitive Status: Within Functional Limits for tasks assessed  Home Living Family/patient expects to be discharged to:: Private residence Living Arrangements: Spouse/significant other Available Help at Discharge: Family Type of Home: House Home Access: Stairs to enter [employer] of Steps: 1 Entrance Stairs-Rails: None Home Layout: Two level;Able to live on main level with bedroom/bathroom     Bathroom Shower/Tub: Occupational psychologist: Standard     Home Equipment: Environmental consultant - 2 wheels;Walker - 4 wheels;Cane - single point;Bedside commode          Prior Functioning/Environment Level of Independence: Independent                 OT Problem List: Pain      OT Treatment/Interventions:      OT Goals(Current goals can be found in the care plan section) Acute Rehab OT Goals Patient Stated Goal: To move  better and hurt less OT Goal Formulation: With patient Time For Goal Achievement: 05/21/20 Potential to Achieve Goals: Good  OT Frequency:     Barriers to D/C:   none noted         Co-evaluation              AM-PAC OT "6 Clicks" Daily Activity     Outcome Measure Help from another person eating meals?: None Help from another person taking care of personal grooming?: None Help from another person toileting, which includes using toliet, bedpan, or urinal?: None Help from another person bathing (including washing, rinsing, drying)?: A Little Help from another person to put on and taking off regular upper body clothing?: None Help from another person to put on and taking off regular lower body clothing?: A Little 6 Click Score: 22   End of Session Equipment Utilized During Treatment: Rolling walker;Left knee immobilizer Nurse Communication: Patient requests pain meds  Activity Tolerance: Patient limited by pain Patient left: in bed  OT Visit Diagnosis: Pain Pain - Right/Left: Left Pain - part of body: Leg                Time: 0932-6712 OT Time Calculation (min): 22 min Charges:  OT General Charges $OT Visit: 1 Visit OT Evaluation $OT Eval Moderate Complexity: 1 Mod  05/21/2020  [place], OTR/L  Acute Rehabilitation Services  Office:  [place] 05/21/2020, 8:48 AM

## 2020-05-21 NOTE — Progress Notes (Signed)
Physical Therapy Treatment Patient Details Name: Eric Delgado MRN: [id number] DOB: Dec 02, 1955 Today's Date: 05/21/2020    History of Present Illness Pt is a 65 y/o male s/p L TKA. PMH includes HTN and bilateral THA.    PT Comments    Continuing work on functional mobility and activity tolerance;  Session focused on assessing knee control in stance to allow for amb without bracing, and on therex to encourage knee flexion and extension; Lots of education re: positioning at rest for early achievement of full extnsion; Very nice progressive walk in the hallways without notable L knee buckle in stance; on track for dc ohme today; Plan to return for stair training at around 1 pm   Follow Up Recommendations  Follow surgeon's recommendation for DC plan and follow-up therapies     Equipment Recommendations  None recommended by PT    Recommendations for Other Services       Precautions / Restrictions Precautions Precautions: Knee Precaution Booklet Issued: Yes (comment) Precaution Comments: Verbally reviewed knee precautions. Required Braces or Orthoses: Knee Immobilizer - Right Knee Immobilizer - Right: Other (comment) (KI in room, not in order set; educated pt on the goal to be able to control and stabilize his knee without KI, but with his own musculature) Restrictions Weight Bearing Restrictions: Yes LLE Weight Bearing: Weight bearing as tolerated    Mobility  Bed Mobility Overal bed mobility: Modified Independent Bed Mobility: Supine to Sit           General bed mobility comments: Slow moving and used rails, but di dnot need physical assist to get up; used REL to suport LLE coming off the bed    Transfers Overall transfer level: Needs assistance Equipment used: Rolling walker (2 wheeled) Transfers: Sit to/from [facility] Sit to Stand: Supervision         General transfer comment: Cues for hand placement options and safety; Discussed prepositioning  LLE for comfort with sit<>stand transitions, AND that as his pain wanes, it is beneficial to allow for his knee to bend generously with thise transitions  Ambulation/Gait Ambulation/Gait assistance: Min guard Gait Distance (Feet): 70 Feet Assistive device: Rolling walker (2 wheeled) Gait Pattern/deviations: Step-through pattern (emerging)     General Gait Details: Able to walk in the hallways without KI and no noteworthy L knee buckle in stance; multimodal cues for posture, and to allow for knee flexion during L swing   Stairs             Wheelchair Mobility    Modified Rankin (Stroke Patients Only)       Balance Overall balance assessment: Needs assistance Sitting-balance support: No upper extremity supported;Feet supported Sitting balance-Leahy Scale: Good     Standing balance support: Bilateral upper extremity supported Standing balance-Leahy Scale: Poor Standing balance comment: Reliant on BUE support                            Cognition Arousal/Alertness: Awake/alert Behavior During Therapy: WFL for tasks assessed/performed Overall Cognitive Status: Within Functional Limits for tasks assessed                                        Exercises Total Joint Exercises Ankle Circles/Pumps: AROM;Both;5 reps Quad Sets: AROM;Right;Left;10 reps (notable hamstring co-contraction LLE) Short Arc Quad: AAROM;Left;10 reps Knee Flexion: Seated;AROM;Left;5 reps (Cues for ways to progress  himself) Goniometric ROM: approx 4-60 degrees    General Comments General comments (skin integrity, edema, etc.): Lengthy discussion re: PT follow up; Pt does not have rides for Outpt PT follow up, so HHPT follow up is appropriate -- Per SW, pt is already setup; pt tell me he has a stationary bike at home; I encouraged him to start with it early with HHPT's direction      Pertinent Vitals/Pain Pain Assessment: Faces Faces Pain Scale: Hurts whole lot Pain  Location: L knee with teherex, in particualr flexion Pain Descriptors / Indicators: Grimacing;Guarding Pain Intervention(s): Premedicated before session    Home Living Family/patient expects to be discharged to:: Private residence Living Arrangements: Spouse/significant other Available Help at Discharge: Family Type of Home: House Home Access: Stairs to enter Entrance Stairs-Rails: None Home Layout: Two level;Able to live on main level with bedroom/bathroom Home Equipment: Gilford Rile - 2 wheels;Walker - 4 wheels;Cane - single point;Bedside commode      Prior Function Level of Independence: Independent          PT Goals (current goals can now be found in the care plan section) Acute Rehab PT Goals Patient Stated Goal: To move better and hurt less PT Goal Formulation: With patient Time For Goal Achievement: 06/03/20 Potential to Achieve Goals: Good Progress towards PT goals: Progressing toward goals    Frequency    7X/week      PT Plan Current plan remains appropriate    Co-evaluation              AM-PAC PT "6 Clicks" Mobility   Outcome Measure  Help needed turning from your back to your side while in a flat bed without using bedrails?: A Little Help needed moving from lying on your back to sitting on the side of a flat bed without using bedrails?: A Little Help needed moving to and from a bed to a chair (including a wheelchair)?: A Little Help needed standing up from a chair using your arms (e.g., wheelchair or bedside chair)?: A Little Help needed to walk in hospital room?: A Little Help needed climbing 3-5 steps with a railing? : A Little 6 Click Score: 18    End of Session Equipment Utilized During Treatment: Gait belt Activity Tolerance: Patient tolerated treatment well Patient left: in chair;with call bell/phone within reach Nurse Communication: Mobility status PT Visit Diagnosis: Muscle weakness (generalized) (M62.81);Other abnormalities of gait and  mobility (R26.89);Pain Pain - Right/Left: Left Pain - part of body: Knee     Time: 1030-1105 PT Time Calculation (min) (ACUTE ONLY): 35 min  Charges:  $Gait Training: 8-22 mins $Therapeutic Exercise: 8-22 mins                     Roney Marion, PT  Acute Rehabilitation Services Pager [phone] Office [place] 05/21/2020, 11:43 AM

## 2020-05-21 NOTE — Discharge Instructions (Signed)

## 2020-05-23 ENCOUNTER — Other Ambulatory Visit: Payer: Self-pay | Admitting: Orthopaedic Surgery

## 2020-05-23 ENCOUNTER — Telehealth: Payer: Self-pay

## 2020-05-23 ENCOUNTER — Telehealth: Payer: Self-pay | Admitting: Orthopaedic Surgery

## 2020-05-23 DIAGNOSIS — G5600 Carpal tunnel syndrome, unspecified upper limb: Secondary | ICD-10-CM | POA: Diagnosis not present

## 2020-05-23 DIAGNOSIS — K449 Diaphragmatic hernia without obstruction or gangrene: Secondary | ICD-10-CM | POA: Diagnosis not present

## 2020-05-23 DIAGNOSIS — Z96642 Presence of left artificial hip joint: Secondary | ICD-10-CM | POA: Diagnosis not present

## 2020-05-23 DIAGNOSIS — Z87891 Personal history of nicotine dependence: Secondary | ICD-10-CM | POA: Diagnosis not present

## 2020-05-23 DIAGNOSIS — E669 Obesity, unspecified: Secondary | ICD-10-CM | POA: Diagnosis not present

## 2020-05-23 DIAGNOSIS — Z471 Aftercare following joint replacement surgery: Secondary | ICD-10-CM | POA: Diagnosis not present

## 2020-05-23 DIAGNOSIS — Z96652 Presence of left artificial knee joint: Secondary | ICD-10-CM | POA: Diagnosis not present

## 2020-05-23 DIAGNOSIS — I10 Essential (primary) hypertension: Secondary | ICD-10-CM | POA: Diagnosis not present

## 2020-05-23 DIAGNOSIS — Z7982 Long term (current) use of aspirin: Secondary | ICD-10-CM | POA: Diagnosis not present

## 2020-05-23 DIAGNOSIS — F5104 Psychophysiologic insomnia: Secondary | ICD-10-CM | POA: Diagnosis not present

## 2020-05-23 DIAGNOSIS — M5431 Sciatica, right side: Secondary | ICD-10-CM | POA: Diagnosis not present

## 2020-05-23 DIAGNOSIS — M159 Polyosteoarthritis, unspecified: Secondary | ICD-10-CM | POA: Diagnosis not present

## 2020-05-23 DIAGNOSIS — K5900 Constipation, unspecified: Secondary | ICD-10-CM | POA: Diagnosis not present

## 2020-05-23 DIAGNOSIS — E785 Hyperlipidemia, unspecified: Secondary | ICD-10-CM | POA: Diagnosis not present

## 2020-05-23 DIAGNOSIS — Z6831 Body mass index (BMI) 31.0-31.9, adult: Secondary | ICD-10-CM | POA: Diagnosis not present

## 2020-05-23 MED ORDER — HYDROMORPHONE HCL 2 MG PO TABS: 2.0000 mg | ORAL_TABLET | ORAL | 0 refills | Status: DC | PRN

## 2020-05-23 NOTE — Telephone Encounter (Signed)
This needs to come through ortho.

## 2020-05-23 NOTE — Telephone Encounter (Signed)
Patient would like a Rx refill on his pain medication?  Stated that he will be out of medication on Sunday. Patient had left knee surgery on 05/20/2020.  Cb# [phone].  Please advise.  Thank you.

## 2020-05-23 NOTE — Telephone Encounter (Signed)
My chart request for refill on hydromorphone 2 mg; per med list and I spoke with Lennette Bihari pharmacist at [facility] and Dr Santiago Bumpers did send in hydromorphone 2 mg # 30 on 05/21/20 and pt did pick up medication per Lennette Bihari. Sending note to Dr Danise Mina.

## 2020-05-23 NOTE — Telephone Encounter (Signed)
Spoke with pt relaying Dr. Synthia Innocent message.  Pt verbalizes understanding and agrees.  Says he's not sure why it was sent to Dr. Darnell Level but pt will contact the pharmacy.

## 2020-05-26 DIAGNOSIS — F5104 Psychophysiologic insomnia: Secondary | ICD-10-CM | POA: Diagnosis not present

## 2020-05-26 DIAGNOSIS — Z96652 Presence of left artificial knee joint: Secondary | ICD-10-CM | POA: Diagnosis not present

## 2020-05-26 DIAGNOSIS — Z471 Aftercare following joint replacement surgery: Secondary | ICD-10-CM | POA: Diagnosis not present

## 2020-05-26 DIAGNOSIS — Z6831 Body mass index (BMI) 31.0-31.9, adult: Secondary | ICD-10-CM | POA: Diagnosis not present

## 2020-05-26 DIAGNOSIS — I10 Essential (primary) hypertension: Secondary | ICD-10-CM | POA: Diagnosis not present

## 2020-05-26 DIAGNOSIS — M159 Polyosteoarthritis, unspecified: Secondary | ICD-10-CM | POA: Diagnosis not present

## 2020-05-26 DIAGNOSIS — Z87891 Personal history of nicotine dependence: Secondary | ICD-10-CM | POA: Diagnosis not present

## 2020-05-26 DIAGNOSIS — G5600 Carpal tunnel syndrome, unspecified upper limb: Secondary | ICD-10-CM | POA: Diagnosis not present

## 2020-05-26 DIAGNOSIS — M5431 Sciatica, right side: Secondary | ICD-10-CM | POA: Diagnosis not present

## 2020-05-26 DIAGNOSIS — K5900 Constipation, unspecified: Secondary | ICD-10-CM | POA: Diagnosis not present

## 2020-05-26 DIAGNOSIS — K449 Diaphragmatic hernia without obstruction or gangrene: Secondary | ICD-10-CM | POA: Diagnosis not present

## 2020-05-26 DIAGNOSIS — E669 Obesity, unspecified: Secondary | ICD-10-CM | POA: Diagnosis not present

## 2020-05-26 DIAGNOSIS — Z96642 Presence of left artificial hip joint: Secondary | ICD-10-CM | POA: Diagnosis not present

## 2020-05-26 DIAGNOSIS — E785 Hyperlipidemia, unspecified: Secondary | ICD-10-CM | POA: Diagnosis not present

## 2020-05-26 DIAGNOSIS — Z7982 Long term (current) use of aspirin: Secondary | ICD-10-CM | POA: Diagnosis not present

## 2020-05-27 DIAGNOSIS — Z6831 Body mass index (BMI) 31.0-31.9, adult: Secondary | ICD-10-CM | POA: Diagnosis not present

## 2020-05-27 DIAGNOSIS — M159 Polyosteoarthritis, unspecified: Secondary | ICD-10-CM | POA: Diagnosis not present

## 2020-05-27 DIAGNOSIS — Z471 Aftercare following joint replacement surgery: Secondary | ICD-10-CM | POA: Diagnosis not present

## 2020-05-27 DIAGNOSIS — G5600 Carpal tunnel syndrome, unspecified upper limb: Secondary | ICD-10-CM | POA: Diagnosis not present

## 2020-05-27 DIAGNOSIS — F5104 Psychophysiologic insomnia: Secondary | ICD-10-CM | POA: Diagnosis not present

## 2020-05-27 DIAGNOSIS — Z7982 Long term (current) use of aspirin: Secondary | ICD-10-CM | POA: Diagnosis not present

## 2020-05-27 DIAGNOSIS — Z96652 Presence of left artificial knee joint: Secondary | ICD-10-CM | POA: Diagnosis not present

## 2020-05-27 DIAGNOSIS — I10 Essential (primary) hypertension: Secondary | ICD-10-CM | POA: Diagnosis not present

## 2020-05-27 DIAGNOSIS — K449 Diaphragmatic hernia without obstruction or gangrene: Secondary | ICD-10-CM | POA: Diagnosis not present

## 2020-05-27 DIAGNOSIS — E669 Obesity, unspecified: Secondary | ICD-10-CM | POA: Diagnosis not present

## 2020-05-27 DIAGNOSIS — Z96642 Presence of left artificial hip joint: Secondary | ICD-10-CM | POA: Diagnosis not present

## 2020-05-27 DIAGNOSIS — Z87891 Personal history of nicotine dependence: Secondary | ICD-10-CM | POA: Diagnosis not present

## 2020-05-27 DIAGNOSIS — E785 Hyperlipidemia, unspecified: Secondary | ICD-10-CM | POA: Diagnosis not present

## 2020-05-27 DIAGNOSIS — M5431 Sciatica, right side: Secondary | ICD-10-CM | POA: Diagnosis not present

## 2020-05-27 DIAGNOSIS — K5900 Constipation, unspecified: Secondary | ICD-10-CM | POA: Diagnosis not present

## 2020-05-29 DIAGNOSIS — F5104 Psychophysiologic insomnia: Secondary | ICD-10-CM | POA: Diagnosis not present

## 2020-05-29 DIAGNOSIS — Z6831 Body mass index (BMI) 31.0-31.9, adult: Secondary | ICD-10-CM | POA: Diagnosis not present

## 2020-05-29 DIAGNOSIS — M159 Polyosteoarthritis, unspecified: Secondary | ICD-10-CM | POA: Diagnosis not present

## 2020-05-29 DIAGNOSIS — E785 Hyperlipidemia, unspecified: Secondary | ICD-10-CM | POA: Diagnosis not present

## 2020-05-29 DIAGNOSIS — Z471 Aftercare following joint replacement surgery: Secondary | ICD-10-CM | POA: Diagnosis not present

## 2020-05-29 DIAGNOSIS — Z7982 Long term (current) use of aspirin: Secondary | ICD-10-CM | POA: Diagnosis not present

## 2020-05-29 DIAGNOSIS — G5600 Carpal tunnel syndrome, unspecified upper limb: Secondary | ICD-10-CM | POA: Diagnosis not present

## 2020-05-29 DIAGNOSIS — K449 Diaphragmatic hernia without obstruction or gangrene: Secondary | ICD-10-CM | POA: Diagnosis not present

## 2020-05-29 DIAGNOSIS — M5431 Sciatica, right side: Secondary | ICD-10-CM | POA: Diagnosis not present

## 2020-05-29 DIAGNOSIS — Z96642 Presence of left artificial hip joint: Secondary | ICD-10-CM | POA: Diagnosis not present

## 2020-05-29 DIAGNOSIS — I10 Essential (primary) hypertension: Secondary | ICD-10-CM | POA: Diagnosis not present

## 2020-05-29 DIAGNOSIS — E669 Obesity, unspecified: Secondary | ICD-10-CM | POA: Diagnosis not present

## 2020-05-29 DIAGNOSIS — Z96652 Presence of left artificial knee joint: Secondary | ICD-10-CM | POA: Diagnosis not present

## 2020-05-29 DIAGNOSIS — K5900 Constipation, unspecified: Secondary | ICD-10-CM | POA: Diagnosis not present

## 2020-05-29 DIAGNOSIS — Z87891 Personal history of nicotine dependence: Secondary | ICD-10-CM | POA: Diagnosis not present

## 2020-05-30 DIAGNOSIS — E669 Obesity, unspecified: Secondary | ICD-10-CM | POA: Diagnosis not present

## 2020-05-30 DIAGNOSIS — K5900 Constipation, unspecified: Secondary | ICD-10-CM | POA: Diagnosis not present

## 2020-05-30 DIAGNOSIS — K449 Diaphragmatic hernia without obstruction or gangrene: Secondary | ICD-10-CM | POA: Diagnosis not present

## 2020-05-30 DIAGNOSIS — Z471 Aftercare following joint replacement surgery: Secondary | ICD-10-CM | POA: Diagnosis not present

## 2020-05-30 DIAGNOSIS — G5600 Carpal tunnel syndrome, unspecified upper limb: Secondary | ICD-10-CM | POA: Diagnosis not present

## 2020-05-30 DIAGNOSIS — M159 Polyosteoarthritis, unspecified: Secondary | ICD-10-CM | POA: Diagnosis not present

## 2020-05-30 DIAGNOSIS — Z96652 Presence of left artificial knee joint: Secondary | ICD-10-CM | POA: Diagnosis not present

## 2020-05-30 DIAGNOSIS — M5431 Sciatica, right side: Secondary | ICD-10-CM | POA: Diagnosis not present

## 2020-05-30 DIAGNOSIS — Z87891 Personal history of nicotine dependence: Secondary | ICD-10-CM | POA: Diagnosis not present

## 2020-05-30 DIAGNOSIS — Z7982 Long term (current) use of aspirin: Secondary | ICD-10-CM | POA: Diagnosis not present

## 2020-05-30 DIAGNOSIS — Z96642 Presence of left artificial hip joint: Secondary | ICD-10-CM | POA: Diagnosis not present

## 2020-05-30 DIAGNOSIS — E785 Hyperlipidemia, unspecified: Secondary | ICD-10-CM | POA: Diagnosis not present

## 2020-05-30 DIAGNOSIS — I10 Essential (primary) hypertension: Secondary | ICD-10-CM | POA: Diagnosis not present

## 2020-05-30 DIAGNOSIS — Z6831 Body mass index (BMI) 31.0-31.9, adult: Secondary | ICD-10-CM | POA: Diagnosis not present

## 2020-05-30 DIAGNOSIS — F5104 Psychophysiologic insomnia: Secondary | ICD-10-CM | POA: Diagnosis not present

## 2020-06-03 ENCOUNTER — Encounter: Payer: Self-pay | Admitting: Orthopaedic Surgery

## 2020-06-03 ENCOUNTER — Other Ambulatory Visit: Payer: Self-pay

## 2020-06-03 ENCOUNTER — Ambulatory Visit (INDEPENDENT_AMBULATORY_CARE_PROVIDER_SITE_OTHER): Payer: BC Managed Care – PPO | Admitting: Orthopaedic Surgery

## 2020-06-03 DIAGNOSIS — Z96652 Presence of left artificial knee joint: Secondary | ICD-10-CM

## 2020-06-03 MED ORDER — HYDROMORPHONE HCL 2 MG PO TABS: 2.0000 mg | ORAL_TABLET | ORAL | 0 refills | Status: DC | PRN

## 2020-06-03 NOTE — Progress Notes (Signed)
The patient is a 65 year old gentleman who is 2-week status post a left total knee arthroplasty.  He is working on increasing his range of motion and strength through home therapy.  He is even mowed his yard.  He says he cannot sleep well.  He is on Dilaudid for pain.  On exam the staples are removed and Steri-Strips placed.  There is moderate knee swelling to be expected.  His calf is soft.  He has been on a baby aspirin twice a day.  He will go back to his daily aspirin at this standpoint.  He has almost full extension but I can flex him to just about 85 degrees.  I stressed the importance of outpatient physical therapy.  He wants to try to do this on his own but I told him that is very difficult to do.  We will work on setting him up for outpatient therapy and he can decide if he wants to do that.  I will refill his Dilaudid and encouraged him to try to wean from that as appropriate.  We will see him back in 4 weeks for repeat exam.

## 2020-06-04 ENCOUNTER — Ambulatory Visit: Payer: Self-pay | Admitting: Physical Therapy

## 2020-06-04 DIAGNOSIS — F5104 Psychophysiologic insomnia: Secondary | ICD-10-CM | POA: Diagnosis not present

## 2020-06-04 DIAGNOSIS — Z471 Aftercare following joint replacement surgery: Secondary | ICD-10-CM | POA: Diagnosis not present

## 2020-06-04 DIAGNOSIS — K449 Diaphragmatic hernia without obstruction or gangrene: Secondary | ICD-10-CM | POA: Diagnosis not present

## 2020-06-04 DIAGNOSIS — Z6831 Body mass index (BMI) 31.0-31.9, adult: Secondary | ICD-10-CM | POA: Diagnosis not present

## 2020-06-04 DIAGNOSIS — K5900 Constipation, unspecified: Secondary | ICD-10-CM | POA: Diagnosis not present

## 2020-06-04 DIAGNOSIS — M159 Polyosteoarthritis, unspecified: Secondary | ICD-10-CM | POA: Diagnosis not present

## 2020-06-04 DIAGNOSIS — Z87891 Personal history of nicotine dependence: Secondary | ICD-10-CM | POA: Diagnosis not present

## 2020-06-04 DIAGNOSIS — E669 Obesity, unspecified: Secondary | ICD-10-CM | POA: Diagnosis not present

## 2020-06-04 DIAGNOSIS — M5431 Sciatica, right side: Secondary | ICD-10-CM | POA: Diagnosis not present

## 2020-06-04 DIAGNOSIS — Z96652 Presence of left artificial knee joint: Secondary | ICD-10-CM | POA: Diagnosis not present

## 2020-06-04 DIAGNOSIS — Z96642 Presence of left artificial hip joint: Secondary | ICD-10-CM | POA: Diagnosis not present

## 2020-06-04 DIAGNOSIS — G5600 Carpal tunnel syndrome, unspecified upper limb: Secondary | ICD-10-CM | POA: Diagnosis not present

## 2020-06-04 DIAGNOSIS — E785 Hyperlipidemia, unspecified: Secondary | ICD-10-CM | POA: Diagnosis not present

## 2020-06-04 DIAGNOSIS — I10 Essential (primary) hypertension: Secondary | ICD-10-CM | POA: Diagnosis not present

## 2020-06-04 DIAGNOSIS — Z7982 Long term (current) use of aspirin: Secondary | ICD-10-CM | POA: Diagnosis not present

## 2020-06-05 DIAGNOSIS — Z87891 Personal history of nicotine dependence: Secondary | ICD-10-CM | POA: Diagnosis not present

## 2020-06-05 DIAGNOSIS — M159 Polyosteoarthritis, unspecified: Secondary | ICD-10-CM | POA: Diagnosis not present

## 2020-06-05 DIAGNOSIS — G5600 Carpal tunnel syndrome, unspecified upper limb: Secondary | ICD-10-CM | POA: Diagnosis not present

## 2020-06-05 DIAGNOSIS — K5900 Constipation, unspecified: Secondary | ICD-10-CM | POA: Diagnosis not present

## 2020-06-05 DIAGNOSIS — E785 Hyperlipidemia, unspecified: Secondary | ICD-10-CM | POA: Diagnosis not present

## 2020-06-05 DIAGNOSIS — Z96652 Presence of left artificial knee joint: Secondary | ICD-10-CM | POA: Diagnosis not present

## 2020-06-05 DIAGNOSIS — Z7982 Long term (current) use of aspirin: Secondary | ICD-10-CM | POA: Diagnosis not present

## 2020-06-05 DIAGNOSIS — I10 Essential (primary) hypertension: Secondary | ICD-10-CM | POA: Diagnosis not present

## 2020-06-05 DIAGNOSIS — E669 Obesity, unspecified: Secondary | ICD-10-CM | POA: Diagnosis not present

## 2020-06-05 DIAGNOSIS — Z471 Aftercare following joint replacement surgery: Secondary | ICD-10-CM | POA: Diagnosis not present

## 2020-06-05 DIAGNOSIS — Z6831 Body mass index (BMI) 31.0-31.9, adult: Secondary | ICD-10-CM | POA: Diagnosis not present

## 2020-06-05 DIAGNOSIS — F5104 Psychophysiologic insomnia: Secondary | ICD-10-CM | POA: Diagnosis not present

## 2020-06-05 DIAGNOSIS — Z96642 Presence of left artificial hip joint: Secondary | ICD-10-CM | POA: Diagnosis not present

## 2020-06-05 DIAGNOSIS — K449 Diaphragmatic hernia without obstruction or gangrene: Secondary | ICD-10-CM | POA: Diagnosis not present

## 2020-06-05 DIAGNOSIS — M5431 Sciatica, right side: Secondary | ICD-10-CM | POA: Diagnosis not present

## 2020-06-06 DIAGNOSIS — F5104 Psychophysiologic insomnia: Secondary | ICD-10-CM | POA: Diagnosis not present

## 2020-06-06 DIAGNOSIS — E785 Hyperlipidemia, unspecified: Secondary | ICD-10-CM | POA: Diagnosis not present

## 2020-06-06 DIAGNOSIS — Z96642 Presence of left artificial hip joint: Secondary | ICD-10-CM | POA: Diagnosis not present

## 2020-06-06 DIAGNOSIS — E669 Obesity, unspecified: Secondary | ICD-10-CM | POA: Diagnosis not present

## 2020-06-06 DIAGNOSIS — I10 Essential (primary) hypertension: Secondary | ICD-10-CM | POA: Diagnosis not present

## 2020-06-06 DIAGNOSIS — K449 Diaphragmatic hernia without obstruction or gangrene: Secondary | ICD-10-CM | POA: Diagnosis not present

## 2020-06-06 DIAGNOSIS — Z6831 Body mass index (BMI) 31.0-31.9, adult: Secondary | ICD-10-CM | POA: Diagnosis not present

## 2020-06-06 DIAGNOSIS — Z96652 Presence of left artificial knee joint: Secondary | ICD-10-CM | POA: Diagnosis not present

## 2020-06-06 DIAGNOSIS — Z7982 Long term (current) use of aspirin: Secondary | ICD-10-CM | POA: Diagnosis not present

## 2020-06-06 DIAGNOSIS — Z87891 Personal history of nicotine dependence: Secondary | ICD-10-CM | POA: Diagnosis not present

## 2020-06-06 DIAGNOSIS — M159 Polyosteoarthritis, unspecified: Secondary | ICD-10-CM | POA: Diagnosis not present

## 2020-06-06 DIAGNOSIS — G5600 Carpal tunnel syndrome, unspecified upper limb: Secondary | ICD-10-CM | POA: Diagnosis not present

## 2020-06-06 DIAGNOSIS — Z471 Aftercare following joint replacement surgery: Secondary | ICD-10-CM | POA: Diagnosis not present

## 2020-06-06 DIAGNOSIS — M5431 Sciatica, right side: Secondary | ICD-10-CM | POA: Diagnosis not present

## 2020-06-06 DIAGNOSIS — K5900 Constipation, unspecified: Secondary | ICD-10-CM | POA: Diagnosis not present

## 2020-06-09 ENCOUNTER — Other Ambulatory Visit: Payer: Self-pay | Admitting: Orthopaedic Surgery

## 2020-06-09 ENCOUNTER — Telehealth: Payer: Self-pay

## 2020-06-09 MED ORDER — HYDROCODONE-ACETAMINOPHEN 7.5-325 MG PO TABS: 1.0000 | ORAL_TABLET | Freq: Three times a day (TID) | ORAL | 0 refills | Status: DC | PRN

## 2020-06-09 NOTE — Telephone Encounter (Signed)
Pt called in and would like some different pain medication. Pt states he is building up a tolerance to the morphine and he is trying to come off of it but he still needs it at night.

## 2020-07-01 ENCOUNTER — Ambulatory Visit: Payer: BC Managed Care – PPO | Admitting: Orthopaedic Surgery

## 2020-07-02 ENCOUNTER — Encounter: Payer: Self-pay | Admitting: Physician Assistant

## 2020-07-02 ENCOUNTER — Ambulatory Visit (INDEPENDENT_AMBULATORY_CARE_PROVIDER_SITE_OTHER): Payer: BC Managed Care – PPO | Admitting: Physician Assistant

## 2020-07-02 DIAGNOSIS — Z96652 Presence of left artificial knee joint: Secondary | ICD-10-CM

## 2020-07-02 MED ORDER — OXYCODONE-ACETAMINOPHEN 5-325 MG PO TABS: 1.0000 | ORAL_TABLET | ORAL | 0 refills | Status: DC | PRN

## 2020-07-02 NOTE — Progress Notes (Signed)
HPI: Ms. Fauble returns now 6 weeks status post left total knee arthroplasty.  He is doing his own therapy.  States he is having pain mostly at night and is taking pain medicine at night and splinting refill on oxycodone.  He states that the hydrocodone does not give him any relief.  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  He does note that he has clicking in the knee at times.  Physical exam: Left knee full extension flexion to 110 degrees.  No abnormal warmth erythema or edema.  Calf supple nontender, dorsiflexion plantarflexion ankle intact.  Surgical incisions healing well no signs of infection.  No significant instability with valgus varus stressing.  Impression: Status post left total knee arthroplasty 05/20/2020  Plan: He will continue to work on scar tissue mobilization.  Continue to work on range of motion strengthening the knee.  He is shown quad strengthening exercises particularly to work on the [place].  Did refill his oxycodone he is advised not to drive or operate heavy equipment while taking this medication.  He states that he will just mainly take it at night.  See him back in 6 months with for an AP and lateral view of the left knee sooner if there is any questions concerns.  Questions were encouraged and answered.  He is given a note to return to work full duties.

## 2020-07-06 ENCOUNTER — Other Ambulatory Visit: Payer: Self-pay | Admitting: Orthopaedic Surgery

## 2020-07-06 ENCOUNTER — Telehealth: Payer: Self-pay | Admitting: Family Medicine

## 2020-07-06 DIAGNOSIS — E785 Hyperlipidemia, unspecified: Secondary | ICD-10-CM

## 2020-07-06 DIAGNOSIS — Z125 Encounter for screening for malignant neoplasm of prostate: Secondary | ICD-10-CM

## 2020-07-06 DIAGNOSIS — I10 Essential (primary) hypertension: Secondary | ICD-10-CM

## 2020-07-08 ENCOUNTER — Telehealth: Payer: Self-pay | Admitting: Orthopaedic Surgery

## 2020-07-08 NOTE — Telephone Encounter (Signed)
Received call from pt. He wanted to know the first date he was seen. I advised, and he had more questions about his visits. I advised he should get a copy of his records. He will come in and sign release. Once I receive that, I will call him when ready.

## 2020-07-08 NOTE — Telephone Encounter (Signed)
E-scribed refill.  Plz schedule lab and cpe visits.  

## 2020-07-09 NOTE — Telephone Encounter (Signed)
Patient is wanting to know if he will need to do lab work being he had a lot of labs 6 weeks ago while in the hospital. He is schedule for 09/12/20 for cpe. Please advise we can use those labs or if he will need to have them done.   Also,  LAST APPOINTMENT DATE: 05/23/2020   NEXT APPOINTMENT DATE:@7 /29/2022  MEDICATION: amlodipine 5 mg and lisinopril 40 mg   PHARMACY: [facility]   Medication got wet and needs a refill. He has only a week left.   Let patient know to contact pharmacy at the end of the day to make sure medication is ready.  Please notify patient to allow 48-72 hours to process  Encourage patient to contact the pharmacy for refills or they can request refills through [facility]:   LAST REFILL:  QTY:  REFILL DATE:    OTHER COMMENTS:    Okay for refill?  Please advise

## 2020-07-09 NOTE — Telephone Encounter (Signed)
Lvm asking pt to call back.  Need to inform pt, according to our records, he has enough lisinopril refills through 11/2020 at [facility].  He just needs to call and speak with someone letting them know what happened so he can get more pills.  Now, he may have to pay out of pocket if he recently picked up a refill because insurance may not pay for it.  Also, we may need fasting labs.  Does not look like that was done.

## 2020-07-10 NOTE — Telephone Encounter (Signed)
Lvm asking pt to call back.  Need to inform pt, according to our records, he has enough lisinopril refills through 11/2020 at [facility].  He just needs to call and speak with someone letting them know what happened so he can get more pills.  Now, he may have to pay out of pocket if he recently picked up a refill because insurance may not pay for it.  Also, we may need fasting labs.  Does not look like that was done.

## 2020-07-11 NOTE — Telephone Encounter (Signed)
Lvm asking pt to call back.  Need to inform pt, according to our records, he has enough lisinopril refills through 11/2020 at [facility].  He just needs to call and speak with someone letting them know what happened so he can get more pills.  Now, he may have to pay out of pocket if he recently picked up a refill because insurance may not pay for it.  Also, we may need fasting labs.  Does not look like that was done.

## 2020-07-15 NOTE — Telephone Encounter (Addendum)
Spoke with pt relaying message.  Pt verbalizes understanding and says he paid for 10 pills at the pharmacy.    Also, pt reports he found out today he was being laid off soon.  Not sure if he will have insurance at time of CPE, so he declined to schedule fasting labs at this time.

## 2020-07-18 NOTE — Telephone Encounter (Signed)
Patient called back and scheduled fasting labs for this Monday 07/21/20-please place orders.  CPE rescheduled for June 21st-worked him in since he will not have insurance after June. Let me know if that is not ok. Thank you

## 2020-07-18 NOTE — Telephone Encounter (Addendum)
Labs ordered.

## 2020-07-18 NOTE — Addendum Note (Signed)
Addended by: Ria Bush on: 07/18/2020 12:08 PM   Modules accepted: Orders

## 2020-07-21 ENCOUNTER — Other Ambulatory Visit (INDEPENDENT_AMBULATORY_CARE_PROVIDER_SITE_OTHER): Payer: BC Managed Care – PPO

## 2020-07-21 ENCOUNTER — Other Ambulatory Visit: Payer: Self-pay

## 2020-07-21 DIAGNOSIS — E785 Hyperlipidemia, unspecified: Secondary | ICD-10-CM

## 2020-07-21 DIAGNOSIS — Z125 Encounter for screening for malignant neoplasm of prostate: Secondary | ICD-10-CM | POA: Diagnosis not present

## 2020-07-21 LAB — LIPID PANEL
Cholesterol: 209 mg/dL — ABNORMAL HIGH (ref 0–200)
HDL: 67.1 mg/dL (ref >39.00)
LDL Cholesterol: 108 mg/dL — ABNORMAL HIGH (ref 0–99)
NonHDL: 142.03
Total CHOL/HDL Ratio: 3
Triglycerides: 171 mg/dL — ABNORMAL HIGH (ref 0.0–149.0)
VLDL: 34.2 mg/dL (ref 0.0–40.0)

## 2020-07-21 LAB — COMPREHENSIVE METABOLIC PANEL
ALT: 18 U/L (ref 0–53)
AST: 17 U/L (ref 0–37)
Albumin: 4.7 g/dL (ref 3.5–5.2)
Alkaline Phosphatase: 43 U/L (ref 39–117)
BUN: 25 mg/dL — ABNORMAL HIGH (ref 6–23)
CO2: 27 mEq/L (ref 19–32)
Calcium: 9.8 mg/dL (ref 8.4–10.5)
Chloride: 98 mEq/L (ref 96–112)
Creatinine, Ser: 1.03 mg/dL (ref 0.40–1.50)
GFR: 77.16 mL/min (ref >60.00)
Glucose, Bld: 105 mg/dL — ABNORMAL HIGH (ref 70–99)
Potassium: 4.7 mEq/L (ref 3.5–5.1)
Sodium: 133 mEq/L — ABNORMAL LOW (ref 135–145)
Total Bilirubin: 0.5 mg/dL (ref 0.2–1.2)
Total Protein: 7.3 g/dL (ref 6.0–8.3)

## 2020-07-21 LAB — PSA: PSA: 0.81 ng/mL (ref 0.10–4.00)

## 2020-07-21 LAB — TSH: TSH: 3.88 u[IU]/mL (ref 0.35–4.50)

## 2020-08-05 ENCOUNTER — Other Ambulatory Visit: Payer: Self-pay

## 2020-08-05 ENCOUNTER — Ambulatory Visit (INDEPENDENT_AMBULATORY_CARE_PROVIDER_SITE_OTHER): Payer: BC Managed Care – PPO | Admitting: Family Medicine

## 2020-08-05 ENCOUNTER — Encounter: Payer: Self-pay | Admitting: Family Medicine

## 2020-08-05 VITALS — BP 140/70 | HR 86 | Temp 97.7°F | Ht 68.25 in | Wt 218.3 lb

## 2020-08-05 DIAGNOSIS — Z87891 Personal history of nicotine dependence: Secondary | ICD-10-CM

## 2020-08-05 DIAGNOSIS — Z96652 Presence of left artificial knee joint: Secondary | ICD-10-CM

## 2020-08-05 DIAGNOSIS — M159 Polyosteoarthritis, unspecified: Secondary | ICD-10-CM

## 2020-08-05 DIAGNOSIS — Z0001 Encounter for general adult medical examination with abnormal findings: Secondary | ICD-10-CM

## 2020-08-05 DIAGNOSIS — I10 Essential (primary) hypertension: Secondary | ICD-10-CM

## 2020-08-05 DIAGNOSIS — H9192 Unspecified hearing loss, left ear: Secondary | ICD-10-CM

## 2020-08-05 DIAGNOSIS — M8949 Other hypertrophic osteoarthropathy, multiple sites: Secondary | ICD-10-CM

## 2020-08-05 DIAGNOSIS — R454 Irritability and anger: Secondary | ICD-10-CM

## 2020-08-05 DIAGNOSIS — F5104 Psychophysiologic insomnia: Secondary | ICD-10-CM

## 2020-08-05 DIAGNOSIS — E785 Hyperlipidemia, unspecified: Secondary | ICD-10-CM | POA: Diagnosis not present

## 2020-08-05 DIAGNOSIS — K439 Ventral hernia without obstruction or gangrene: Secondary | ICD-10-CM | POA: Insufficient documentation

## 2020-08-05 DIAGNOSIS — Z96643 Presence of artificial hip joint, bilateral: Secondary | ICD-10-CM

## 2020-08-05 DIAGNOSIS — E669 Obesity, unspecified: Secondary | ICD-10-CM

## 2020-08-05 MED ORDER — ROSUVASTATIN CALCIUM 20 MG PO TABS: 20.0000 mg | ORAL_TABLET | Freq: Every day | ORAL | 3 refills | Status: DC

## 2020-08-05 MED ORDER — HYDROCHLOROTHIAZIDE 25 MG PO TABS: 25.0000 mg | ORAL_TABLET | Freq: Every day | ORAL | 3 refills | Status: DC

## 2020-08-05 MED ORDER — LISINOPRIL 40 MG PO TABS: 40.0000 mg | ORAL_TABLET | Freq: Every day | ORAL | 3 refills | Status: DC

## 2020-08-05 MED ORDER — TRAZODONE HCL 50 MG PO TABS: ORAL_TABLET | ORAL | 3 refills | Status: DC

## 2020-08-05 MED ORDER — AMLODIPINE BESYLATE 10 MG PO TABS: 10.0000 mg | ORAL_TABLET | Freq: Every day | ORAL | 3 refills | Status: DC

## 2020-08-05 NOTE — Assessment & Plan Note (Signed)
Large asxs, with small umbilical hernia also asxs

## 2020-08-05 NOTE — Assessment & Plan Note (Signed)
Stable period on trazodone 25-50mg  nightly - continue.

## 2020-08-05 NOTE — Assessment & Plan Note (Signed)
Chronic, stable period on crestor 20mg  daily - continue. Reviewed diet choices to improve LDL control.  The 10-year ASCVD risk score Mikey Bussing DC Brooke Bonito., et al., 2013) is: 12.7%   Values used to calculate the score:     Age: 65 years     Sex: Male     Is Non-Hispanic African American: No     Diabetic: No     Tobacco smoker: No     Systolic Blood Pressure: 233 mmHg     Is BP treated: Yes     HDL Cholesterol: 67.1 mg/dL     Total Cholesterol: 209 mg/dL

## 2020-08-05 NOTE — Assessment & Plan Note (Signed)
Quit smoking 2011. ~30 PY hx.  Did not return calls for lung cancer screening last year.  Reviewed indication for lung cancer screening program, as well as with fmhx small cell in mother.  He will consider but asks to defer given upcoming changes to insurance coverage.

## 2020-08-05 NOTE — Assessment & Plan Note (Addendum)
S/p multiple joint replacements (bilateral hips, left knee).  Hands with OA deformity and heberden's nodes on exam. Suspect R 3rd digit stenosing tenosynovitis (triggering).  He asks about disability based on significant osteoarthritis - advised I don't do disability evaluations but I did provide him with info to look into applying.  Discussed trial of glucosamine or turmeric for joints.

## 2020-08-05 NOTE — Assessment & Plan Note (Signed)
Preventative protocols reviewed and updated unless pt declined. Discussed healthy diet and lifestyle.  

## 2020-08-05 NOTE — Assessment & Plan Note (Addendum)
Chronic, mildly elevated in office however pt states he consistently gets higher readings at home - will increase amlodipine to 10mg  daily, monitoring for ankle edema. Continue lisinopril and hctz.  Monitor hyponatremia likely due to HCTZ

## 2020-08-05 NOTE — Assessment & Plan Note (Signed)
Deteriorated in setting of recently being laid off.  Doing well on trazodone for sleep.

## 2020-08-05 NOTE — Progress Notes (Signed)
Patient ID: Eric Delgado, male    DOB: 1955/10/29, 65 y.o.   MRN: [id number]  This visit was conducted in person.  BP 140/70   Pulse 86   Temp 97.7 F (36.5 C) (Temporal)   Ht 5' 8.25" (1.734 m)   Wt 218 lb 5 oz (99 kg)   SpO2 96%   BMI 32.95 kg/m   148/78 on repeat testing  CC: CPE Subjective:   HPI: Eric Delgado is a 65 y.o. male presenting on 08/05/2020 for Annual Exam   About to lose insurance. Lost job after knee replacement surgery.  Known significant diffuse osteoarthritis even noted throughout fingers. Interested in looking into long term disability.  DOT CPE with [facility] (12/2015).   Chronic sleep maintenance insomnia - stable period on trazodone 25-50mg  nightly.  S/p L knee replacement 05/2020 Eric Delgado) - rough recovery.   HTN - BP remaining elevated despite amlodipine 5mg  daily, lisinopril 40mg  daily, hctz 25mg  daily. Regularly takes nabumetone or ibuprofen 800mg  PRN. Limits salt in diet.   Progressive L hearing loss ongoing for years.   Preventative: COLONOSCOPY Date: 10/2010 WNL, rpt 10 yrs (Dr. [facility])  [facility]oscopy [facility]) 2 polyps, rpt 5 yrs - records requested today  Prostate cancer screening - yearly with PSA Lung cancer screening - 30 PY hx  Flu shot yearly [place] 08/2019, 09/2019  Td 2009, Tdap 11/2018 Shingrix 09/2018, 11/2018 Seat belt use discussed  Sunscreen use discussed. No changing moles on skin.  Ex smoker - quit 2011. Second hand smoke at work. 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 Interim medical history since  our last visit reviewed. Allergies and medications reviewed and updated. Outpatient Medications Prior to Visit  Medication Sig Dispense Refill   aspirin 81 MG chewable tablet Chew 1 tablet (81 mg total) by mouth 2 (two) times daily. 30 tablet 0   Multiple Vitamin (MULTIVITAMIN WITH MINERALS) TABS tablet Take 1 tablet by mouth every morning.     nabumetone (RELAFEN) 750 MG tablet TAKE 1 TABLET BY MOUTH 2 TIMES DAILY AS NEEDED. 60 tablet 1   amLODipine (NORVASC) 5 MG tablet Take 1 tablet (5 mg total) by mouth daily. 90 tablet 3   hydrochlorothiazide (HYDRODIURIL) 25 MG tablet Take 1 tablet (25 mg total) by mouth daily. 90 tablet 3   lisinopril (ZESTRIL) 40 MG tablet Take 1 tablet (40 mg total) by mouth daily. 90 tablet 3   rosuvastatin (CRESTOR) 20 MG tablet Take 1 tablet (20 mg total) by mouth at bedtime. 90 tablet 3   traZODone (DESYREL) 50 MG tablet TAKE 1/2 TO 1 TABLET (25-50 MG TOTAL) BY MOUTH AT BEDTIME AS NEEDED FOR SLEEP. 90 tablet 0   oxyCODONE-acetaminophen (PERCOCET/ROXICET) 5-325 MG tablet Take 1-2 tablets by mouth every 4 (four) hours as needed for severe pain. 30 tablet 0   tiZANidine (ZANAFLEX) 4 MG tablet Take 1 tablet (4 mg total) by mouth every 6 (six) hours as needed for muscle spasms. 40 tablet 1   No facility-administered medications prior to visit.     Per HPI unless specifically indicated in ROS section below  Review of Systems  Constitutional:  Negative for activity change, appetite change, chills, fatigue, fever and unexpected weight change.  HENT:  Positive for postnasal drip. Negative for hearing loss.   Eyes:  Negative for visual disturbance.  Respiratory:  Positive for shortness of breath (normal exertional). Negative for cough, chest tightness and wheezing.   Cardiovascular:  Positive for leg swelling (L sided after surgery). Negative for chest pain and palpitations.  Gastrointestinal:  Negative for abdominal distention, abdominal pain, blood in stool,  constipation, diarrhea, nausea and vomiting.  Genitourinary:  Negative for difficulty urinating and hematuria.  Musculoskeletal:  Negative for arthralgias, myalgias and neck pain.  Skin:  Negative for rash.  Neurological:  Negative for dizziness, seizures, syncope and headaches.  Hematological:  Negative for adenopathy. Does not bruise/bleed easily.  Psychiatric/Behavioral:  Positive for dysphoric mood. The patient is nervous/anxious.   Objective:  BP 140/70   Pulse 86   Temp 97.7 F (36.5 C) (Temporal)   Ht 5' 8.25" (1.734 m)   Wt 218 lb 5 oz (99 kg)   SpO2 96%   BMI 32.95 kg/m   Wt Readings from Last 3 Encounters:  08/05/20 218 lb 5 oz (99 kg)  05/20/20 210 lb (95.3 kg)  05/16/20 221 lb 12.8 oz (100.6 kg)      Physical Exam Vitals and nursing note reviewed.  Constitutional:      General: He is not in acute distress.    Appearance: Normal appearance. He is well-developed. He is not ill-appearing.  HENT:     Head: Normocephalic and atraumatic.     Right Ear: Hearing, tympanic membrane, ear canal and external ear normal.     Left Ear: Hearing, tympanic membrane, ear canal and external ear normal.  Eyes:     General: No scleral icterus.    Extraocular Movements: Extraocular movements intact.     Conjunctiva/sclera: Conjunctivae normal.     Pupils: Pupils are equal, round, and reactive to light.  Neck:     Thyroid: No thyroid mass or thyromegaly.  Cardiovascular:     Rate and Rhythm: Normal rate and regular rhythm.     Pulses: Normal pulses.          Radial pulses are 2+ on the right side and 2+ on the left side.     Heart sounds: Normal heart sounds. No murmur heard. Pulmonary:     Effort: Pulmonary effort is normal. No respiratory distress.     Breath sounds: Normal breath sounds. No wheezing, rhonchi or rales.  Abdominal:     General: Bowel sounds are normal. There is no distension.     Palpations: Abdomen is soft. There is no mass.     Tenderness: There is no  abdominal tenderness. There is no guarding or rebound.     Hernia: A hernia is present. Hernia is present in the umbilical area (tiny) and ventral area (large, with diastasis recti).  Musculoskeletal:        General: Tenderness and deformity (hypertrophic deformity to most DIPs bilateral hands) present. Normal range of motion.     Cervical back: Normal range of motion and neck supple.     Right lower leg: No edema.     Left lower leg: No edema.     Comments: Tender to palpation possible nodule mid R palm at 3rd metacarpal   Lymphadenopathy:     Cervical: No cervical adenopathy.  Skin:    General: Skin is warm and dry.     Findings: No  rash.  Neurological:     General: No focal deficit present.     Mental Status: He is alert and oriented to person, place, and time.  Psychiatric:        Mood and Affect: Mood normal.        Behavior: Behavior normal.        Thought Content: Thought content normal.        Judgment: Judgment normal.      Results for orders placed or performed in visit on 07/21/20  TSH  Result Value Ref Range   TSH 3.88 0.35 - 4.50 uIU/mL  PSA  Result Value Ref Range   PSA 0.81 0.10 - 4.00 ng/mL  Comprehensive metabolic panel  Result Value Ref Range   Sodium 133 (L) 135 - 145 mEq/L   Potassium 4.7 3.5 - 5.1 mEq/L   Chloride 98 96 - 112 mEq/L   CO2 27 19 - 32 mEq/L   Glucose, Bld 105 (H) 70 - 99 mg/dL   BUN 25 (H) 6 - 23 mg/dL   Creatinine, Ser 1.03 0.40 - 1.50 mg/dL   Total Bilirubin 0.5 0.2 - 1.2 mg/dL   Alkaline Phosphatase 43 39 - 117 U/L   AST 17 0 - 37 U/L   ALT 18 0 - 53 U/L   Total Protein 7.3 6.0 - 8.3 g/dL   Albumin 4.7 3.5 - 5.2 g/dL   GFR 77.16 >60.00 mL/min   Calcium 9.8 8.4 - 10.5 mg/dL  Lipid panel  Result Value Ref Range   Cholesterol 209 (H) 0 - 200 mg/dL   Triglycerides 171.0 (H) 0.0 - 149.0 mg/dL   HDL 67.10 >39.00 mg/dL   VLDL 34.2 0.0 - 40.0 mg/dL   LDL Cholesterol 108 (H) 0 - 99 mg/dL   Total CHOL/HDL Ratio 3    NonHDL 142.03     Depression screen [facility] 2/9 08/05/2020 12/22/2018 07/30/2016  Decreased Interest 3 2 0  Down, Depressed, Hopeless 0 0 0  PHQ - 2 Score 3 2 0  Altered sleeping 3 3 -  Tired, decreased energy 2 3 -  Change in appetite 3 3 -  Feeling bad or failure about yourself  1 1 -  Trouble concentrating 2 2 -  Moving slowly or fidgety/restless 0 0 -  Suicidal thoughts 0 0 -  PHQ-9 Score 14 14 -    GAD 7 : Generalized Anxiety Score 08/05/2020 12/22/2018  Nervous, Anxious, on Edge 3 2  Control/stop worrying 2 3  Worry too much - different things 3 3  Trouble relaxing 2 3  Restless 3 3  Easily annoyed or irritable 2 3  Afraid - awful might happen 0 0  Total GAD 7 Score 15 17   Assessment & Plan:  This visit occurred during the SARS-CoV-2 public health emergency.  Safety protocols were in place, including screening questions prior to the visit, additional usage of staff PPE, and extensive cleaning of exam room while observing appropriate contact time as indicated for disinfecting solutions.   Problem List Items Addressed This Visit     HLD (hyperlipidemia)    Chronic, stable period on crestor 20mg  daily - continue. Reviewed diet choices to improve LDL control.  The 10-year ASCVD risk score Mikey Bussing DC Jr., et al., 2013) is: 12.7%   Values used to calculate the score:     Age: 54 years     Sex: Male     Is Non-Hispanic African American: No     Diabetic: No  Tobacco smoker: No     Systolic Blood Pressure: 301 mmHg     Is BP treated: Yes     HDL Cholesterol: 67.1 mg/dL     Total Cholesterol: 209 mg/dL        Relevant Medications   hydrochlorothiazide (HYDRODIURIL) 25 MG tablet   lisinopril (ZESTRIL) 40 MG tablet   rosuvastatin (CRESTOR) 20 MG tablet   amLODipine (NORVASC) 10 MG tablet   Irritability    Deteriorated in setting of recently being laid off.  Doing well on trazodone for sleep.        Essential hypertension    Chronic, mildly elevated in office however pt states he  consistently gets higher readings at home - will increase amlodipine to 10mg  daily, monitoring for ankle edema. Continue lisinopril and hctz.  Monitor hyponatremia likely due to HCTZ       Relevant Medications   hydrochlorothiazide (HYDRODIURIL) 25 MG tablet   lisinopril (ZESTRIL) 40 MG tablet   rosuvastatin (CRESTOR) 20 MG tablet   amLODipine (NORVASC) 10 MG tablet   Encounter for general adult medical examination with abnormal findings - Primary    Preventative protocols reviewed and updated unless pt declined. Discussed healthy diet and lifestyle.        Ex-smoker    Quit smoking 2011. ~30 PY hx.  Did not return calls for lung cancer screening last year.  Reviewed indication for lung cancer screening program, as well as with fmhx small cell in mother.  He will consider but asks to defer given upcoming changes to insurance coverage.        Osteoarthritis, multiple sites    S/p multiple joint replacements (bilateral hips, left knee).  Hands with OA deformity and heberden's nodes on exam. Suspect R 3rd digit stenosing tenosynovitis (triggering).  He asks about disability based on significant osteoarthritis - advised I don't do disability evaluations but I did provide him with info to look into applying.  Discussed trial of glucosamine or turmeric for joints.        Status post hip replacement, bilateral   Obesity, Class I, BMI 30-34.9    Encouraged healthy diet and lifestyle choices to affect sustainable weight loss, reviewed how this would benefit chronic osteoarthritis pain.        Chronic insomnia    Stable period on trazodone 25-50mg  nightly - continue.        Status post left knee replacement   Left ear hearing loss    Longstanding chronic left sided hearing loss  Offered ENT evaluation - declined for now.        Ventral hernia    Large asxs, with small umbilical hernia also asxs         Meds ordered this encounter  Medications   hydrochlorothiazide  (HYDRODIURIL) 25 MG tablet    Sig: Take 1 tablet (25 mg total) by mouth daily.    Dispense:  90 tablet    Refill:  3   lisinopril (ZESTRIL) 40 MG tablet    Sig: Take 1 tablet (40 mg total) by mouth daily.    Dispense:  90 tablet    Refill:  3   rosuvastatin (CRESTOR) 20 MG tablet    Sig: Take 1 tablet (20 mg total) by mouth at bedtime.    Dispense:  90 tablet    Refill:  3   traZODone (DESYREL) 50 MG tablet    Sig: TAKE 1/2 TO 1 TABLET (25-50 MG TOTAL) BY MOUTH AT BEDTIME AS NEEDED FOR SLEEP.  Dispense:  90 tablet    Refill:  3   amLODipine (NORVASC) 10 MG tablet    Sig: Take 1 tablet (10 mg total) by mouth daily.    Dispense:  90 tablet    Refill:  3    No orders of the defined types were placed in this encounter.   Patient instructions: We will request records from [facility] for latest colonoscopy 05/2020 [facility]).  Look into disability evaluation at [email or web address] or call for general information: [phone]. You will likely need a consultative examination (CE) Consider lung cancer screening program.  Consider ENT evaluation for left sided hearing loss.  Consider trial of glucosamine or turmeric for joints -likely osteoarthritis related pain. Blood pressure is staying elevated - increase amlodipine to 10mg  daily - watch for ankle swelling on higher dose. Work on [employer].  Good to see you today. Call us with questions.  Return as needed or in 6 months for blood pressure follow up visit   Follow up plan: Return in about 6 months (around 02/04/2021), or if symptoms worsen or fail to improve, for follow up visit.  Ria Bush, MD

## 2020-08-05 NOTE — Patient Instructions (Signed)
We will request records from Dr [facility] for latest colonoscopy 05/2020 [facility]).  Look into disability evaluation at [email or web address] or call for general information: [phone]. You will likely need a consultative examination (CE) Consider lung cancer screening program.  Consider ENT evaluation for left sided hearing loss.  Consider trial of glucosamine or turmeric for joints -likely osteoarthritis related pain. Blood pressure is staying elevated - increase amlodipine to 10mg  daily - watch for ankle swelling on higher dose. Work on [employer].  Good to see you today. Call us with questions.  Return as needed or in 6 months for blood pressure follow up visit   [email or web address]">  DASH Eating Plan DASH stands for Dietary Approaches to Stop Hypertension. The DASH eating plan is a healthy eating plan that has been shown to: Reduce high blood pressure (hypertension). Reduce your risk for type 2 diabetes, heart disease, and stroke. Help with weight loss. What are tips for following this plan? Reading food labels Check food labels for the amount of salt (sodium) per serving. Choose foods with less than 5 percent of the Daily Value of sodium. Generally, foods with less than 300 milligrams (mg) of sodium per serving fit into this eating plan. To find whole grains, look for the word "whole" as the first word in the ingredient list. Shopping Buy products labeled as "low-sodium" or "no salt added." Buy fresh foods. Avoid canned foods and pre-made or frozen meals. Cooking Avoid adding salt when cooking. Use salt-free seasonings or herbs instead of table salt or sea salt. Check with your health care provider or pharmacist before using salt substitutes. Do not fry foods. Cook foods using healthy methods such as baking, boiling, grilling, roasting, and broiling instead. Cook with heart-healthy oils, such as  olive, canola, avocado, soybean, or sunflower oil. Meal planning  Eat a balanced diet that includes: 4 or more servings of fruits and 4 or more servings of vegetables each day. Try to fill one-half of your plate with fruits and vegetables. 6-8 servings of whole grains each day. Less than 6 oz (170 g) of lean meat, poultry, or fish each day. A 3-oz (85-g) serving of meat is about the same size as a deck of cards. One egg equals 1 oz (28 g). 2-3 servings of low-fat dairy each day. One serving is 1 cup (237 mL). 1 serving of nuts, seeds, or beans 5 times each week. 2-3 servings of heart-healthy fats. Healthy fats called omega-3 fatty acids are found in foods such as walnuts, flaxseeds, fortified milks, and eggs. These fats are also found in cold-water fish, such as sardines, salmon, and mackerel. Limit how much you eat of: Canned or prepackaged foods. Food that is high in trans fat, such as some fried foods. Food that is high in saturated fat, such as fatty meat. Desserts and other sweets, sugary drinks, and other foods with added sugar. Full-fat dairy products. Do not salt foods before eating. Do not eat more than 4 egg yolks a week. Try to eat at least 2 vegetarian meals a week. Eat more home-cooked food and less restaurant, buffet, and fast food.  Lifestyle When eating at a restaurant, ask that your food be prepared with less salt or no salt, if possible. If you drink alcohol: Limit how much you use to: 0-1 drink a day for women who are not pregnant. 0-2 drinks a day for men. Be aware of how much alcohol is in your drink. In the [place] one drink equals one 12 oz bottle  of beer (355 mL), one 5 oz glass of wine (148 mL), or one 1 oz glass of hard liquor (44 mL). General information Avoid eating more than 2,300 mg of salt a day. If you have hypertension, you may need to reduce your sodium intake to 1,500 mg a day. Work with your health care provider to maintain a healthy body weight or to  lose weight. Ask what an ideal weight is for you. Get at least 30 minutes of exercise that causes your heart to beat faster (aerobic exercise) most days of the week. Activities may include walking, swimming, or biking. Work with your health care provider or dietitian to adjust your eating plan to your individual calorie needs. What foods should I eat? Fruits All fresh, dried, or frozen fruit. Canned fruit in natural juice (without addedsugar). Vegetables Fresh or frozen vegetables (raw, steamed, roasted, or grilled). Low-sodium or reduced-sodium tomato and vegetable juice. Low-sodium or reduced-sodium tomatosauce and tomato paste. Low-sodium or reduced-sodium canned vegetables. Grains Whole-grain or whole-wheat bread. Whole-grain or whole-wheat pasta. Brown rice. [place]. Bulgur. Whole-grain and low-sodium cereals. Pita bread.Low-fat, low-sodium crackers. Whole-wheat flour tortillas. Meats and other proteins Skinless chicken or [place]. Ground chicken or [place]. Pork with fat trimmed off. Fish and seafood. Egg whites. Dried beans, peas, or lentils. Unsalted nuts, nut butters, and seeds. Unsalted canned beans. Lean cuts of beef with fat trimmed off. Low-sodium, lean precooked or cured meat, such as sausages or meatloaves. Dairy Low-fat (1%) or fat-free (skim) milk. Reduced-fat, low-fat, or fat-free cheeses. Nonfat, low-sodium ricotta or cottage cheese. Low-fat or nonfatyogurt. Low-fat, low-sodium cheese. Fats and oils Soft margarine without trans fats. Vegetable oil. Reduced-fat, low-fat, or light mayonnaise and salad dressings (reduced-sodium). Canola, safflower, olive, avocado, soybean, andsunflower oils. Avocado. Seasonings and condiments Herbs. Spices. Seasoning mixes without salt. Other foods Unsalted popcorn and pretzels. Fat-free sweets. The items listed above may not be a complete list of foods and beverages you can eat. Contact a dietitian for more information. What foods should I  avoid? Fruits Canned fruit in a light or heavy syrup. Fried fruit. Fruit in cream or buttersauce. Vegetables Creamed or fried vegetables. Vegetables in a cheese sauce. Regular canned vegetables (not low-sodium or reduced-sodium). Regular canned tomato sauce and paste (not low-sodium or reduced-sodium). Regular tomato and vegetable juice(not low-sodium or reduced-sodium). Angie Fava. Olives. Grains Baked goods made with fat, such as croissants, muffins, or some breads. Drypasta or rice meal packs. Meats and other proteins Fatty cuts of meat. Ribs. Fried meat. Berniece Salines. Bologna, salami, and other precooked or cured meats, such as sausages or meat loaves. Fat from the back of a pig (fatback). Bratwurst. Salted nuts and seeds. Canned beans with added salt. Canned orsmoked fish. Whole eggs or egg yolks. Chicken or [place] with skin. Dairy Whole or 2% milk, cream, and half-and-half. Whole or full-fat cream cheese. Whole-fat or sweetened yogurt. Full-fat cheese. Nondairy creamers. Whippedtoppings. Processed cheese and cheese spreads. Fats and oils Butter. Stick margarine. Lard. Shortening. Ghee. Bacon fat. Tropical oils, suchas coconut, palm kernel, or palm oil. Seasonings and condiments Onion salt, garlic salt, seasoned salt, table salt, and sea salt. Worcestershire sauce. Tartar sauce. Barbecue sauce. Teriyaki sauce. Soy sauce, including reduced-sodium. Steak sauce. Canned and packaged gravies. Fish sauce. Oyster sauce. Cocktail sauce. Store-bought horseradish. Ketchup. Mustard. Meat flavorings and tenderizers. Bouillon cubes. Hot sauces. Pre-made or packaged marinades. Pre-made or packaged taco seasonings. Relishes. Regular saladdressings. Other foods Salted popcorn and pretzels. The items listed above may not be a complete list  of foods and beverages you should avoid. Contact a dietitian for more information. Where to find more information National Heart, Lung, and Blood Institute:  https://wilson-eaton.com/ American Heart Association: www.heart.org Academy of Nutrition and Dietetics: www.eatright.[place]: www.kidney.org Summary The DASH eating plan is a healthy eating plan that has been shown to reduce high blood pressure (hypertension). It may also reduce your risk for type 2 diabetes, heart disease, and stroke. When on the DASH eating plan, aim to eat more fresh fruits and vegetables, whole grains, lean proteins, low-fat dairy, and heart-healthy fats. With the DASH eating plan, you should limit salt (sodium) intake to 2,300 mg a day. If you have hypertension, you may need to reduce your sodium intake to 1,500 mg a day. Work with your health care provider or dietitian to adjust your eating plan to your individual calorie needs. This information is not intended to replace advice given to you by your health care provider. Make sure you discuss any questions you have with your healthcare provider. Document Revised: 01/05/2019 Document Reviewed: 01/05/2019 Elsevier Patient Education  2022 [employer].

## 2020-08-05 NOTE — Assessment & Plan Note (Signed)
Encouraged healthy diet and lifestyle choices to affect sustainable weight loss, reviewed how this would benefit chronic osteoarthritis pain.

## 2020-08-05 NOTE — Assessment & Plan Note (Signed)
Longstanding chronic left sided hearing loss  Offered ENT evaluation - declined for now.

## 2020-08-22 ENCOUNTER — Encounter: Payer: Self-pay | Admitting: Family Medicine

## 2020-08-28 ENCOUNTER — Telehealth: Payer: Self-pay

## 2020-08-28 DIAGNOSIS — I10 Essential (primary) hypertension: Secondary | ICD-10-CM

## 2020-08-28 NOTE — Telephone Encounter (Signed)
Plz get update on how he's feeling on Friday.  Agree with cutting amlodipine back to 5mg  daily, elevating legs, push water, limiting salt/sodium.  Would offer OV for further evaluation of new symptoms. If BP remains elevated, would recommend starting metoprolol 25mg  bid.

## 2020-08-28 NOTE — Telephone Encounter (Signed)
I suspect the swelling is from a higher dose of amlodipine.  He may be able to tolerate a low dose of metoprolol given his pulse of 86 at the last OV, but I will defer to PCP.  Thanks.

## 2020-08-28 NOTE — Telephone Encounter (Addendum)
Pt left v/m pt was seen 08/05/20 and amlodipine was increased to 10 mg daily. Pt was to call if legs started swelling and today legs feel tight and cannot see ankles due to swelling. Pt said "swollen heavy". Pt said has been taking lisinopril 40 mg daily and HCTZ 25 mg daily as well. Pt did not take the amlodipine today. Pt said starting 08/25/20 thru today pt has been working outside on tin roof of a house. Pt said he has been climbing up and down a ladder also. Both pts feet feel like the tops of the feet are burning, pt has mid calf to feet swelling bilaterally. Both ankles are so swollen you cannot see the ankles. No redness but feet hurt. Pt is lightheaded on and off all wk and SOB on and off.pt had H/A this morning. Pt also has new symptoms of cloudy vision. Pt said he feels like hands are swelling and feel stiff;pt denies by saying not really any weakness in legs or arms and no difficulty in walking. Has take BP x 3 today BP 159/79 P 82 147/70 P 82 and 169/80 P 82. Pt wants to know if should continue taking the amlodipine or stop it. Sending note to Dr Darnell Level who is out of office and Dr Damita Dunnings and Janett Billow CMA. I spoke with Dr Barnett Abu said taking amlodipine 5 mg did not have any real swelling, pt said has had worse swelling the last 2 days and pt did not think his swelling in lower leg and feet went down last night. Dr Damita Dunnings said could start amlodipine 5 mg on 08/29/20 if any SOB at rest or laying down flat has SOB or CP; pt should go to ED. Pt is going to rest and keep his feet up and stay hydrated.pt will wait to hear back on 08/29/20 further instructions.

## 2020-08-29 NOTE — Telephone Encounter (Signed)
Pt said he hasn't taken amlodipine today and didn't take it yesterday. Pt said the swelling is still there but it has improved a lot. He is able to see his ankles he hasn't checked his BP recently but he will resume taking the 5 mg dose and do what Dr. Darnell Level instructed to try and help and keep an eye on BP. If sxs don't continue to improve or if BP increases he will call back and let us know so we can proceed with next stepts (OV/ Metoprolol med). But he said overall he feels fine.

## 2020-09-12 ENCOUNTER — Encounter: Payer: BC Managed Care – PPO | Admitting: Family Medicine

## 2021-01-01 ENCOUNTER — Ambulatory Visit: Payer: BC Managed Care – PPO | Admitting: Orthopaedic Surgery

## 2021-04-13 DIAGNOSIS — M79641 Pain in right hand: Secondary | ICD-10-CM | POA: Insufficient documentation

## 2021-04-20 ENCOUNTER — Telehealth: Payer: Self-pay | Admitting: Family Medicine

## 2021-04-20 DIAGNOSIS — E785 Hyperlipidemia, unspecified: Secondary | ICD-10-CM

## 2021-04-20 NOTE — Telephone Encounter (Signed)
Pt called stating that he would like an order for blood work. Please advise. ?

## 2021-04-20 NOTE — Telephone Encounter (Signed)
Spoke to patient by telephone and was advised that his insurance is going to end in about two weeks. Patient stated that he will not have medicare until probably some time in September. Patient stated that he wants to have his cholesterol and PSA checked while he still has insurance. Patient was advised that he had his last physical in June of last year. Patient stated that he will not be able to do a physical until after his Medicare starts.  ?

## 2021-04-20 NOTE — Telephone Encounter (Addendum)
Labs ordered for fasting labs (lipids, comprehensive metabolic panel).  ?Latest PSA was very stable (07/2020). This may wait until he gets medicare.  ?Plz schedule lab visit at his convenience. ?

## 2021-04-20 NOTE — Addendum Note (Signed)
Addended by: Ria Bush on: 04/20/2021 05:25 PM ? ? Modules accepted: Orders ? ?

## 2021-04-21 NOTE — Telephone Encounter (Signed)
Spoke with pt relaying Dr. Synthia Innocent message.  Pt verbalizes understanding and wants to come in on 04/23/21 at 8:00 for fasting labs.   ? ?Sent Staff Msg to Terri to add pt to schedule.  ?

## 2021-04-23 ENCOUNTER — Other Ambulatory Visit: Payer: Self-pay

## 2021-04-23 ENCOUNTER — Other Ambulatory Visit (INDEPENDENT_AMBULATORY_CARE_PROVIDER_SITE_OTHER): Payer: 59

## 2021-04-23 DIAGNOSIS — E785 Hyperlipidemia, unspecified: Secondary | ICD-10-CM | POA: Diagnosis not present

## 2021-04-23 LAB — COMPREHENSIVE METABOLIC PANEL
ALT: 20 U/L (ref 0–53)
AST: 19 U/L (ref 0–37)
Albumin: 4.7 g/dL (ref 3.5–5.2)
Alkaline Phosphatase: 35 U/L — ABNORMAL LOW (ref 39–117)
BUN: 21 mg/dL (ref 6–23)
CO2: 27 mEq/L (ref 19–32)
Calcium: 9.8 mg/dL (ref 8.4–10.5)
Chloride: 97 mEq/L (ref 96–112)
Creatinine, Ser: 1.1 mg/dL (ref 0.40–1.50)
GFR: 70.93 mL/min (ref >60.00)
Glucose, Bld: 107 mg/dL — ABNORMAL HIGH (ref 70–99)
Potassium: 4.6 mEq/L (ref 3.5–5.1)
Sodium: 132 mEq/L — ABNORMAL LOW (ref 135–145)
Total Bilirubin: 0.5 mg/dL (ref 0.2–1.2)
Total Protein: 7.5 g/dL (ref 6.0–8.3)

## 2021-04-23 LAB — LIPID PANEL
Cholesterol: 188 mg/dL (ref 0–200)
HDL: 65.4 mg/dL (ref >39.00)
LDL Cholesterol: 95 mg/dL (ref 0–99)
NonHDL: 122.48
Total CHOL/HDL Ratio: 3
Triglycerides: 135 mg/dL (ref 0.0–149.0)
VLDL: 27 mg/dL (ref 0.0–40.0)

## 2021-06-01 DIAGNOSIS — M19049 Primary osteoarthritis, unspecified hand: Secondary | ICD-10-CM | POA: Insufficient documentation

## 2021-06-23 ENCOUNTER — Encounter: Payer: Self-pay | Admitting: Family Medicine

## 2021-06-23 DIAGNOSIS — D039 Melanoma in situ, unspecified: Secondary | ICD-10-CM | POA: Insufficient documentation

## 2021-07-16 DIAGNOSIS — M65949 Unspecified synovitis and tenosynovitis, unspecified hand: Secondary | ICD-10-CM | POA: Insufficient documentation

## 2021-08-07 ENCOUNTER — Other Ambulatory Visit: Payer: Self-pay | Admitting: Family Medicine

## 2021-11-20 ENCOUNTER — Ambulatory Visit (INDEPENDENT_AMBULATORY_CARE_PROVIDER_SITE_OTHER): Payer: Medicare HMO | Admitting: Family Medicine

## 2021-11-20 ENCOUNTER — Encounter (HOSPITAL_BASED_OUTPATIENT_CLINIC_OR_DEPARTMENT_OTHER): Payer: Self-pay | Admitting: Family Medicine

## 2021-11-20 VITALS — BP 130/69 | HR 78 | Temp 97.9°F | Ht 68.25 in | Wt 227.8 lb

## 2021-11-20 DIAGNOSIS — F5104 Psychophysiologic insomnia: Secondary | ICD-10-CM | POA: Diagnosis not present

## 2021-11-20 DIAGNOSIS — E785 Hyperlipidemia, unspecified: Secondary | ICD-10-CM

## 2021-11-20 DIAGNOSIS — I10 Essential (primary) hypertension: Secondary | ICD-10-CM

## 2021-11-20 DIAGNOSIS — F1090 Alcohol use, unspecified, uncomplicated: Secondary | ICD-10-CM

## 2021-11-20 MED ORDER — HYDROCHLOROTHIAZIDE 25 MG PO TABS: 25.0000 mg | ORAL_TABLET | Freq: Every day | ORAL | 0 refills | Status: DC

## 2021-11-20 MED ORDER — TRAZODONE HCL 50 MG PO TABS: ORAL_TABLET | ORAL | 0 refills | Status: DC

## 2021-11-20 MED ORDER — LISINOPRIL 40 MG PO TABS
40.0000 mg | ORAL_TABLET | Freq: Every day | ORAL | 0 refills | Status: DC
Start: 2021-11-20 — End: 2022-03-05

## 2021-11-20 MED ORDER — NALTREXONE HCL 50 MG PO TABS: 50.0000 mg | ORAL_TABLET | Freq: Every day | ORAL | 1 refills | Status: DC

## 2021-11-20 MED ORDER — AMLODIPINE BESYLATE 5 MG PO TABS: 5.0000 mg | ORAL_TABLET | Freq: Every day | ORAL | 0 refills | Status: DC

## 2021-11-20 MED ORDER — ROSUVASTATIN CALCIUM 20 MG PO TABS: 20.0000 mg | ORAL_TABLET | Freq: Every day | ORAL | 0 refills | Status: DC

## 2021-11-20 NOTE — Assessment & Plan Note (Signed)
Moderate alcohol use at present with at least 3-4 drinks daily.  He has quit in the past, would be interested in quitting again and requests assistance with pharmacotherapy to aid in cessation and abstinence at this time.  He has looked into naltrexone as an option Feel would be reasonable to begin with naltrexone to aid in abstinence, prescription sent to pharmacy on file, discussed medication, administration, risk and benefits We will plan to follow-up closely in about 4 to 6 weeks to assess progress.  We will plan to also check liver enzymes at that time

## 2021-11-20 NOTE — Progress Notes (Signed)
New Patient Office Visit  Subjective    Patient ID: Eric Delgado, male    DOB: 02/14/1956  Age: 66 y.o. MRN: [id number]  CC:  Chief Complaint  Patient presents with   New Patient (Initial Visit)    Pt here to establish new care, pt need refills on his medications     HPI Eric Delgado presents to establish care Last PCP - Dr. Danise Mina, last visit more than 1 year ago  Past medical history significant for hypertension, hyperlipidemia, insomnia.  Hypertension: Current medication includes amlodipine 5 mg once daily.  Also takes hydrochlorothiazide 25 mg daily and lisinopril 40 mg daily.  Denies any current issues with chest pain or headaches.  Requesting refill of medications today  Hyperlipidemia: Currently taking rosuvastatin 20 mg daily.  No reported issues with myalgias.  Requesting refill of medications today  Alcohol use: He does report moderate alcohol use with at least 2-3 beers daily and 1 mixed drink daily.  He has tried quitting in the past, however currently is wondering about any pharmacotherapy which can assist with quitting at this time.  Generally, he indicates that he ends up drinking more so out of habit  Erectile dysfunction: Reports intermittent issues with this, did not actually seem to be too concerned by symptoms.  Was unsure if will be something that may warrant pharmacotherapy or if there may be other considerations  Patient is originally from [place]. Patient works putting in Sales executive. Outside of work, he enjoys relaxing.  Outpatient Encounter Medications as of 11/20/2021  Medication Sig   aspirin 81 MG chewable tablet Chew 1 tablet (81 mg total) by mouth 2 (two) times daily.   Multiple Vitamin (MULTIVITAMIN WITH MINERALS) TABS tablet Take 1 tablet by mouth every morning.   nabumetone (RELAFEN) 750 MG tablet TAKE 1 TABLET BY MOUTH 2 TIMES DAILY AS NEEDED.   naltrexone (DEPADE) 50 MG tablet Take 1 tablet (50 mg total) by mouth  daily.   [DISCONTINUED] amLODipine (NORVASC) 5 MG tablet Take 1 tablet by mouth once daily   [DISCONTINUED] hydrochlorothiazide (HYDRODIURIL) 25 MG tablet Take 1 tablet by mouth once daily   [DISCONTINUED] lisinopril (ZESTRIL) 40 MG tablet Take 1 tablet by mouth once daily   [DISCONTINUED] rosuvastatin (CRESTOR) 20 MG tablet TAKE 1 TABLET BY MOUTH EVERY DAY AT BEDTIME   [DISCONTINUED] traZODone (DESYREL) 50 MG tablet TAKE 1/2 TO 1 TABLET AT BEDTIME AS NEEDED FOR SLEEP   amLODipine (NORVASC) 5 MG tablet Take 1 tablet (5 mg total) by mouth daily.   hydrochlorothiazide (HYDRODIURIL) 25 MG tablet Take 1 tablet (25 mg total) by mouth daily.   lisinopril (ZESTRIL) 40 MG tablet Take 1 tablet (40 mg total) by mouth daily.   rosuvastatin (CRESTOR) 20 MG tablet Take 1 tablet (20 mg total) by mouth at bedtime.   traZODone (DESYREL) 50 MG tablet TAKE 1/2 TO 1 TABLET AT BEDTIME AS NEEDED FOR SLEEP   [DISCONTINUED] amLODipine (NORVASC) 10 MG tablet Take 0.5 tablets (5 mg total) by mouth daily.   No facility-administered encounter medications on file as of 11/20/2021.    Past Medical History:  Diagnosis Date   Difficulty sleeping    takes xanax for sleep   History of hiatal hernia    Hyperlipidemia    Hypertension    Osteoarthritis, multiple sites 12/21/2013   SCIATICA, RIGHT 10/12/2007   Qualifier: Diagnosis of  By: Linda Hedges MD, Heinz Knuckles     Past Surgical History:  Procedure Laterality Date  APPENDECTOMY  1975   CATARACT EXTRACTION Bilateral 02/2015   [facility]   COLONOSCOPY  10/2010   WNL, rpt 10 yrs (Dr. [facility])   COLONOSCOPY  04/2020   TA, SSP, diverticulosis, rpt 5 yrs ([facility])   ROTATOR CUFF REPAIR     left   TOTAL HIP ARTHROPLASTY Right Spring '12   Dr. Alvan Dame - ceramic ball, fiber stem   TOTAL HIP ARTHROPLASTY Left 12/21/2013   Procedure: LEFT TOTAL HIP ARTHROPLASTY ANTERIOR APPROACH;  Surgeon: Mcarthur Rossetti, MD;  Location: [facility];   Service: Orthopedics;  Laterality: Left;   TOTAL KNEE ARTHROPLASTY Left 05/20/2020   Procedure: LEFT TOTAL KNEE ARTHROPLASTY;  Surgeon: Mcarthur Rossetti, MD;  Location: [facility];  Service: Orthopedics;  Laterality: Left;   VASECTOMY      Family History  Problem Relation Age of Onset   Macular degeneration Mother    Hypertension Mother    Hyperlipidemia Mother    Cancer Mother 27       lung (small cell), smoker    Stroke Maternal Aunt    Diabetes Neg Hx    CAD Neg Hx     Social History   Socioeconomic History   Marital status: Married    Spouse name: Not on file   Number of children: Not on file   Years of education: Not on file   Highest education level: Not on file  Occupational History   Not on file  Tobacco Use   Smoking status: Former    Packs/day: 0.50    Years: 30.00    Total pack years: 15.00    Types: Cigarettes    Quit date: 01/15/2010    Years since quitting: 11.8   Smokeless tobacco: Never  Vaping Use   Vaping Use: Some days  Substance and Sexual Activity   Alcohol use: Yes    Comment: occ   Drug use: No   Sexual activity: Yes    Partners: Female  Other Topics Concern   Not on file  Social History Narrative   Lives with wife    Divorced   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   Activity: no regular exercise    Diet: good water, salads daily, red meat 3x/week   Social Determinants of Radio broadcast assistant Strain: Not on file  Food Insecurity: Not on file  Transportation Needs: Not on file  Physical Activity: Not on file  Stress: Not on file  Social Connections: Not on file  Intimate Partner Violence: Not on file    Objective    BP 130/69   Pulse 78   Temp 97.9 F (36.6 C) (Oral)   Ht 5' 8.25" (1.734 m)   Wt 227 lb 12.8 oz (103.3 kg)   SpO2 100%   BMI 34.38 kg/m   Physical Exam  66 year old male in no acute distress Cardiovascular exam regular rate and rhythm, no murmur  appreciated Lungs clear to auscultation bilaterally  Assessment & Plan:   Problem List Items Addressed This Visit       Cardiovascular and Mediastinum   Essential hypertension - Primary    Blood pressure at goal in office today, recommend continuing with amlodipine at current dose, hydrochlorothiazide, lisinopril Recommend intermittent monitoring of blood pressure at home, DASH diet      Relevant Medications   amLODipine (NORVASC) 5 MG tablet   hydrochlorothiazide (HYDRODIURIL) 25 MG tablet   lisinopril (ZESTRIL) 40 MG  tablet   rosuvastatin (CRESTOR) 20 MG tablet     Other   HLD (hyperlipidemia)    Currently tolerating rosuvastatin and present dose, recommend continuing with this medication      Relevant Medications   amLODipine (NORVASC) 5 MG tablet   hydrochlorothiazide (HYDRODIURIL) 25 MG tablet   lisinopril (ZESTRIL) 40 MG tablet   rosuvastatin (CRESTOR) 20 MG tablet   Chronic insomnia    Manages with use of trazodone, typically takes full tablet of 50 mg tablet at night to help with sleep      Alcohol use disorder    Moderate alcohol use at present with at least 3-4 drinks daily.  He has quit in the past, would be interested in quitting again and requests assistance with pharmacotherapy to aid in cessation and abstinence at this time.  He has looked into naltrexone as an option Feel would be reasonable to begin with naltrexone to aid in abstinence, prescription sent to pharmacy on file, discussed medication, administration, risk and benefits We will plan to follow-up closely in about 4 to 6 weeks to assess progress.  We will plan to also check liver enzymes at that time     Discussed that current issues with erectile dysfunction may also be largely contributed to by alcohol consumption as this can notably affect erectile issues.  For now we will monitor progress with expected alcohol abstinence  Return in about 4 weeks (around 12/18/2021) for Med check.  Given BMI  chronic medical issues, would also plan to check hemoglobin A1c at time of next visit/lab draw  Brittini Brubeck J [place], MD

## 2021-11-20 NOTE — Assessment & Plan Note (Signed)
Currently tolerating rosuvastatin and present dose, recommend continuing with this medication

## 2021-11-20 NOTE — Patient Instructions (Signed)
  Medication Instructions:  Your physician recommends that you continue on your current medications as directed. Please refer to the Current Medication list given to you today. --If you need a refill on any your medications before your next appointment, please call your pharmacy first. If no refills are authorized on file call the office.-- Lab Work: Your physician has recommended that you have lab work today: No If you have labs (blood work) drawn today and your tests are completely normal, you will receive your results via [employer] a phone call from our staff.  Please ensure you check your voicemail in the event that you authorized detailed messages to be left on a delegated number. If you have any lab test that is abnormal or we need to change your treatment, we will call you to review the results.  Referrals/Procedures/Imaging: No  Follow-Up: Your next appointment:   Your physician recommends that you schedule a follow-up appointment in: 4-6 weeks follow-up with Dr. de [place].  You will receive a text message or e-mail with a link to a survey about your care and experience with [place] today! We would greatly appreciate your feedback!   Thanks for letting us be apart of your health journey!!  Primary Care and Sports Medicine   Dr. Arlina Robes [place]   We encourage you to activate your patient portal called "MyChart".  Sign up information is provided on this After Visit Summary.  MyChart is used to connect with patients for Virtual Visits (Telemedicine).  Patients are able to view lab/test results, encounter notes, upcoming appointments, etc.  Non-urgent messages can be sent to your provider as well. To learn more about what you can do with MyChart, please visit --  [email or web address].

## 2021-11-20 NOTE — Assessment & Plan Note (Signed)
Manages with use of trazodone, typically takes full tablet of 50 mg tablet at night to help with sleep

## 2021-11-20 NOTE — Assessment & Plan Note (Signed)
Blood pressure at goal in office today, recommend continuing with amlodipine at current dose, hydrochlorothiazide, lisinopril Recommend intermittent monitoring of blood pressure at home, DASH diet

## 2021-12-24 DIAGNOSIS — L821 Other seborrheic keratosis: Secondary | ICD-10-CM | POA: Diagnosis not present

## 2021-12-24 DIAGNOSIS — Z86006 Personal history of melanoma in-situ: Secondary | ICD-10-CM | POA: Diagnosis not present

## 2021-12-24 DIAGNOSIS — L814 Other melanin hyperpigmentation: Secondary | ICD-10-CM | POA: Diagnosis not present

## 2021-12-24 DIAGNOSIS — Z08 Encounter for follow-up examination after completed treatment for malignant neoplasm: Secondary | ICD-10-CM | POA: Diagnosis not present

## 2021-12-24 DIAGNOSIS — Z85820 Personal history of malignant melanoma of skin: Secondary | ICD-10-CM | POA: Diagnosis not present

## 2021-12-24 DIAGNOSIS — D225 Melanocytic nevi of trunk: Secondary | ICD-10-CM | POA: Diagnosis not present

## 2021-12-25 DIAGNOSIS — Z961 Presence of intraocular lens: Secondary | ICD-10-CM | POA: Diagnosis not present

## 2022-01-22 ENCOUNTER — Ambulatory Visit (HOSPITAL_BASED_OUTPATIENT_CLINIC_OR_DEPARTMENT_OTHER): Payer: Medicare HMO | Admitting: Family Medicine

## 2022-03-05 ENCOUNTER — Other Ambulatory Visit (HOSPITAL_BASED_OUTPATIENT_CLINIC_OR_DEPARTMENT_OTHER): Payer: Self-pay | Admitting: Family Medicine

## 2022-03-05 MED ORDER — ROSUVASTATIN CALCIUM 20 MG PO TABS: 20.0000 mg | ORAL_TABLET | Freq: Every day | ORAL | 0 refills | Status: DC

## 2022-03-05 MED ORDER — LISINOPRIL 40 MG PO TABS: 40.0000 mg | ORAL_TABLET | Freq: Every day | ORAL | 0 refills | Status: DC

## 2022-03-05 MED ORDER — AMLODIPINE BESYLATE 5 MG PO TABS: 5.0000 mg | ORAL_TABLET | Freq: Every day | ORAL | 0 refills | Status: DC

## 2022-03-05 MED ORDER — TRAZODONE HCL 50 MG PO TABS: ORAL_TABLET | ORAL | 0 refills | Status: DC

## 2022-03-05 MED ORDER — HYDROCHLOROTHIAZIDE 25 MG PO TABS: 25.0000 mg | ORAL_TABLET | Freq: Every day | ORAL | 0 refills | Status: DC

## 2022-03-19 ENCOUNTER — Telehealth (HOSPITAL_BASED_OUTPATIENT_CLINIC_OR_DEPARTMENT_OTHER): Payer: Self-pay

## 2022-03-19 NOTE — Telephone Encounter (Signed)
Pt was called back in regards to medications. Pt did get refills on 03/05/2022. He said he had enough medication to last until his next appointment. But Pt was not happy because he said he called multiple times to get his appointment rescheduled from December. He showed up today to the office on 03/19/2022 all because no one answered the phone to get him rescheduled.

## 2022-04-26 ENCOUNTER — Telehealth (HOSPITAL_BASED_OUTPATIENT_CLINIC_OR_DEPARTMENT_OTHER): Payer: Self-pay | Admitting: Family Medicine

## 2022-04-26 NOTE — Telephone Encounter (Signed)
Contacted Eric Delgado to schedule their annual wellness visit. Welcome to Medicare visit Due by 01/16/2023.  Thank you,  [place] Direct dial  [phone]

## 2022-05-07 ENCOUNTER — Ambulatory Visit (INDEPENDENT_AMBULATORY_CARE_PROVIDER_SITE_OTHER): Payer: Medicare HMO | Admitting: Family Medicine

## 2022-05-07 ENCOUNTER — Encounter (HOSPITAL_BASED_OUTPATIENT_CLINIC_OR_DEPARTMENT_OTHER): Payer: Self-pay | Admitting: Family Medicine

## 2022-05-07 VITALS — BP 137/73 | HR 90 | Ht 68.25 in | Wt 223.0 lb

## 2022-05-07 DIAGNOSIS — Z87891 Personal history of nicotine dependence: Secondary | ICD-10-CM | POA: Diagnosis not present

## 2022-05-07 DIAGNOSIS — Z125 Encounter for screening for malignant neoplasm of prostate: Secondary | ICD-10-CM

## 2022-05-07 DIAGNOSIS — F1090 Alcohol use, unspecified, uncomplicated: Secondary | ICD-10-CM | POA: Diagnosis not present

## 2022-05-07 DIAGNOSIS — I10 Essential (primary) hypertension: Secondary | ICD-10-CM | POA: Diagnosis not present

## 2022-05-07 DIAGNOSIS — R079 Chest pain, unspecified: Secondary | ICD-10-CM | POA: Diagnosis not present

## 2022-05-07 DIAGNOSIS — Z0000 Encounter for general adult medical examination without abnormal findings: Secondary | ICD-10-CM

## 2022-05-07 MED ORDER — LISINOPRIL 40 MG PO TABS: 40.0000 mg | ORAL_TABLET | Freq: Every day | ORAL | 0 refills | Status: DC

## 2022-05-07 MED ORDER — TRAZODONE HCL 50 MG PO TABS: ORAL_TABLET | ORAL | 0 refills | Status: DC

## 2022-05-07 MED ORDER — HYDROCHLOROTHIAZIDE 25 MG PO TABS: 25.0000 mg | ORAL_TABLET | Freq: Every day | ORAL | 0 refills | Status: DC

## 2022-05-07 MED ORDER — ASPIRIN 81 MG PO CHEW: 81.0000 mg | CHEWABLE_TABLET | Freq: Two times a day (BID) | ORAL | 0 refills | Status: AC

## 2022-05-07 MED ORDER — ROSUVASTATIN CALCIUM 20 MG PO TABS: 20.0000 mg | ORAL_TABLET | Freq: Every day | ORAL | 0 refills | Status: DC

## 2022-05-07 MED ORDER — AMLODIPINE BESYLATE 5 MG PO TABS: 5.0000 mg | ORAL_TABLET | Freq: Every day | ORAL | 0 refills | Status: DC

## 2022-05-07 NOTE — Progress Notes (Unsigned)
    Procedures performed today:    None.  Independent interpretation of notes and tests performed by another provider:   None.  Brief History, Exam, Impression, and Recommendations:    BP 137/73 (BP Location: Right Arm, Patient Position: Sitting, Cuff Size: Large)   Pulse 90   Ht 5' 8.25" (1.734 m)   Wt 223 lb (101.2 kg)   SpO2 100%   BMI 33.66 kg/m   Patient presents for follow-up of chronic medical problems.  Last seen in October with recommended 1 month follow-up.  Essential hypertension Blood pressure at goal in office today.  Denies any current concerns related to chest pain, headaches, vision changes.  Not checking blood pressure regularly at currently.  Can continue with low-dose of amlodipine 5 mg, hydrochlorothiazide recommend intermittent monitoring of blood pressure at home, DASH diet.  Alcohol use disorder Patient did not note much benefit with use of naltrexone and discontinued medication.  We will be planning to recheck labs in relation to upcoming physical.  Patient is still interested in working towards abstinence.  At present, he would prefer to do so on his own without any specific pharmacotherapy  Exertional chest pain Patient reports that he has been experiencing some exertional chest pain over the past couple months.  Since first noticing, does not feel that symptoms have been progressive.  No other associated symptoms with chest pain, denies any issues with shortness of breath, lightheadedness, dizziness.  Symptoms of chest pain are fairly short-lived and resolved spontaneously. On exam, vital signs stable, patient is in no acute distress.  Cardiovascular exam with regular rate and rhythm, lungs clear to auscultation bilaterally. Discussed concerns given risk factors for patient including history of smoking, hypertension, moderate alcohol use.  Given this, feel that further evaluation with cardiology and likely additional testing would be recommended.  Patient in  agreement, referral placed today.  Advised that if chest pain does recur prior to evaluation with cardiology, would recommend evaluation in the emergency department for any acute management which would be warranted  Return in about 2 months (around 07/07/2022).   ___________________________________________ Lekeith Wulf [place], MD, ABFM, CAQSM Primary Care and Sports Medicine [facility]

## 2022-05-17 ENCOUNTER — Other Ambulatory Visit (HOSPITAL_BASED_OUTPATIENT_CLINIC_OR_DEPARTMENT_OTHER): Payer: Medicare HMO

## 2022-05-17 DIAGNOSIS — Z0000 Encounter for general adult medical examination without abnormal findings: Secondary | ICD-10-CM | POA: Diagnosis not present

## 2022-05-17 DIAGNOSIS — Z125 Encounter for screening for malignant neoplasm of prostate: Secondary | ICD-10-CM | POA: Diagnosis not present

## 2022-05-18 LAB — CBC WITH DIFFERENTIAL/PLATELET
Basophils Absolute: 0 10*3/uL (ref 0.0–0.2)
Basos: 1 %
EOS (ABSOLUTE): 0.3 10*3/uL (ref 0.0–0.4)
Eos: 4 %
Hematocrit: 39.1 % (ref 37.5–51.0)
Hemoglobin: 12.8 g/dL — ABNORMAL LOW (ref 13.0–17.7)
Immature Grans (Abs): 0 10*3/uL (ref 0.0–0.1)
Immature Granulocytes: 0 %
Lymphocytes Absolute: 1.3 10*3/uL (ref 0.7–3.1)
Lymphs: 20 %
MCH: 29.2 pg (ref 26.6–33.0)
MCHC: 32.7 g/dL (ref 31.5–35.7)
MCV: 89 fL (ref 79–97)
Monocytes Absolute: 0.8 10*3/uL (ref 0.1–0.9)
Monocytes: 13 %
Neutrophils Absolute: 4.1 10*3/uL (ref 1.4–7.0)
Neutrophils: 62 %
Platelets: 225 10*3/uL (ref 150–450)
RBC: 4.39 x10E6/uL (ref 4.14–5.80)
RDW: 13.2 % (ref 11.6–15.4)
WBC: 6.5 10*3/uL (ref 3.4–10.8)

## 2022-05-18 LAB — COMPREHENSIVE METABOLIC PANEL
ALT: 26 IU/L (ref 0–44)
AST: 20 IU/L (ref 0–40)
Albumin/Globulin Ratio: 2 (ref 1.2–2.2)
Albumin: 4.7 g/dL (ref 3.9–4.9)
Alkaline Phosphatase: 46 IU/L (ref 44–121)
BUN/Creatinine Ratio: 22 (ref 10–24)
BUN: 32 mg/dL — ABNORMAL HIGH (ref 8–27)
Bilirubin Total: 0.3 mg/dL (ref 0.0–1.2)
CO2: 20 mmol/L (ref 20–29)
Calcium: 9.6 mg/dL (ref 8.6–10.2)
Chloride: 101 mmol/L (ref 96–106)
Creatinine, Ser: 1.47 mg/dL — ABNORMAL HIGH (ref 0.76–1.27)
Globulin, Total: 2.4 g/dL (ref 1.5–4.5)
Glucose: 110 mg/dL — ABNORMAL HIGH (ref 70–99)
Potassium: 5 mmol/L (ref 3.5–5.2)
Sodium: 138 mmol/L (ref 134–144)
Total Protein: 7.1 g/dL (ref 6.0–8.5)
eGFR: 53 mL/min/{1.73_m2} — ABNORMAL LOW (ref >59)

## 2022-05-18 LAB — PSA: Prostate Specific Ag, Serum: 1 ng/mL (ref 0.0–4.0)

## 2022-05-18 LAB — LIPID PANEL
Chol/HDL Ratio: 3.2 ratio (ref 0.0–5.0)
Cholesterol, Total: 198 mg/dL (ref 100–199)
HDL: 61 mg/dL (ref >39)
LDL Chol Calc (NIH): 117 mg/dL — ABNORMAL HIGH (ref 0–99)
Triglycerides: 113 mg/dL (ref 0–149)
VLDL Cholesterol Cal: 20 mg/dL (ref 5–40)

## 2022-05-18 LAB — HEMOGLOBIN A1C
Est. average glucose Bld gHb Est-mCnc: 117 mg/dL
Hgb A1c MFr Bld: 5.7 % — ABNORMAL HIGH (ref 4.8–5.6)

## 2022-05-31 DIAGNOSIS — N179 Acute kidney failure, unspecified: Secondary | ICD-10-CM | POA: Insufficient documentation

## 2022-05-31 DIAGNOSIS — Z0000 Encounter for general adult medical examination without abnormal findings: Secondary | ICD-10-CM | POA: Insufficient documentation

## 2022-05-31 NOTE — Progress Notes (Unsigned)
Complete physical exam  Patient: Eric Delgado   DOB: April 08, 1955   67 y.o. Male  MRN: [id number]  Subjective:    Eric Delgado is a 67 y.o. male who presents today for a complete physical exam. He reports consuming a {diet types:17450} diet. {types:19826} He generally feels {DESC; WELL/FAIRLY WELL/POORLY:18703}. He reports sleeping {DESC; WELL/FAIRLY WELL/POORLY:18703}. He {does/does not:200015} have additional problems to discuss today.   CBC normal. Slight dec in Hgb  CMP- recent serum cr 1.47 & EGFR 53 Recheck CMP on 5/1, along with thyroid function. HIV screen?? A1c- 5.7 pre-DM Lipids- elevated LDL 117 PSA normal   Due for pneumonia (PPSV23) vaccine  Currently smoke?  Depression screenings:    05/07/2022    9:06 AM 11/20/2021    9:17 AM 08/05/2020    1:56 PM  Depression screen PHQ 2/9  Decreased Interest 0 0 3  Down, Depressed, Hopeless 0 0 0  PHQ - 2 Score 0 0 3  Altered sleeping  0 3  Tired, decreased energy  2 2  Change in appetite  2 3  Feeling bad or failure about yourself   1 1  Trouble concentrating  1 2  Moving slowly or fidgety/restless  0 0  Suicidal thoughts  0 0  PHQ-9 Score  6 14  Difficult doing work/chores  Not difficult at all     Anxiety screenings:    05/07/2022    9:06 AM 11/20/2021    9:18 AM 08/05/2020    1:57 PM 12/22/2018    7:47 AM  GAD 7 : Generalized Anxiety Score  Nervous, Anxious, on Edge 0 0 3 2  Control/stop worrying 0 0 2 3  Worry too much - different things 0 0 3 3  Trouble relaxing 0 0 2 3  Restless 0 Easily annoyed or irritable 0 Afraid - awful might happen 0 0 0 0  Total GAD 7 Score 0 Anxiety Difficulty Not difficult at all Not difficult at all       {VISON DENTAL STD PSA (Optional):27386}  Patient Care Team: Alyson Reedy, FNP as PCP - General (Family Medicine) Durene Romans, MD (Orthopedic Surgery)   Outpatient Medications Prior to Visit  Medication Sig   amLODipine (NORVASC) 5 MG  tablet Take 1 tablet (5 mg total) by mouth daily.   aspirin 81 MG chewable tablet Chew 1 tablet (81 mg total) by mouth 2 (two) times daily.   hydrochlorothiazide (HYDRODIURIL) 25 MG tablet Take 1 tablet (25 mg total) by mouth daily.   lisinopril (ZESTRIL) 40 MG tablet Take 1 tablet (40 mg total) by mouth daily.   Multiple Vitamin (MULTIVITAMIN WITH MINERALS) TABS tablet Take 1 tablet by mouth every morning.   nabumetone (RELAFEN) 750 MG tablet TAKE 1 TABLET BY MOUTH 2 TIMES DAILY AS NEEDED.   naltrexone (DEPADE) 50 MG tablet Take 1 tablet (50 mg total) by mouth daily. (Patient not taking: Reported on 05/07/2022)   rosuvastatin (CRESTOR) 20 MG tablet Take 1 tablet (20 mg total) by mouth at bedtime.   traZODone (DESYREL) 50 MG tablet TAKE 1/2 TO 1 TABLET AT BEDTIME AS NEEDED FOR SLEEP   No facility-administered medications prior to visit.    ROS     Objective:     There were no vitals taken for this visit. BP Readings from Last 3 Encounters:  05/07/22 137/73  11/20/21 130/69  08/05/20 140/70     Physical Exam  Assessment & Plan:    Routine Health Maintenance and Physical Exam  Health Maintenance  Topic Date Due   Medicare Annual Wellness Visit  Never done   Pneumonia Vaccine (1 of 2 - PCV) Never done   HIV Screening  Never done   COVID-19 Vaccine (3 - 2023-24 season) 10/16/2021   Flu Shot  09/16/2022   DTaP/Tdap/Td vaccine (3 - Td or Tdap) 11/24/2028   Colon Cancer Screening  05/15/2030   Hepatitis C Screening: USPSTF Recommendation to screen - Ages 18-79 yo.  Completed   Zoster (Shingles) Vaccine  Completed   HPV Vaccine  Aged Out    There are no diagnoses linked to this encounter.   No follow-ups on file.  Due for AWV.   Alyson Reedy, FNP

## 2022-06-01 ENCOUNTER — Encounter (HOSPITAL_BASED_OUTPATIENT_CLINIC_OR_DEPARTMENT_OTHER): Payer: Self-pay | Admitting: Family Medicine

## 2022-06-01 ENCOUNTER — Ambulatory Visit (INDEPENDENT_AMBULATORY_CARE_PROVIDER_SITE_OTHER): Payer: Medicare HMO | Admitting: Family Medicine

## 2022-06-01 VITALS — BP 131/76 | HR 83 | Ht 68.0 in | Wt 229.0 lb

## 2022-06-01 DIAGNOSIS — E785 Hyperlipidemia, unspecified: Secondary | ICD-10-CM

## 2022-06-01 DIAGNOSIS — R7303 Prediabetes: Secondary | ICD-10-CM

## 2022-06-01 DIAGNOSIS — I10 Essential (primary) hypertension: Secondary | ICD-10-CM

## 2022-06-01 DIAGNOSIS — Z0001 Encounter for general adult medical examination with abnormal findings: Secondary | ICD-10-CM

## 2022-06-01 DIAGNOSIS — N179 Acute kidney failure, unspecified: Secondary | ICD-10-CM

## 2022-06-01 DIAGNOSIS — Z0000 Encounter for general adult medical examination without abnormal findings: Secondary | ICD-10-CM

## 2022-06-01 DIAGNOSIS — N1831 Chronic kidney disease, stage 3a: Secondary | ICD-10-CM | POA: Diagnosis not present

## 2022-06-08 NOTE — Assessment & Plan Note (Signed)
Blood pressure at goal in office today.  Denies any current concerns related to chest pain, headaches, vision changes.  Not checking blood pressure regularly at currently.  Can continue with low-dose of amlodipine 5 mg, hydrochlorothiazide recommend intermittent monitoring of blood pressure at home, DASH diet.

## 2022-06-08 NOTE — Assessment & Plan Note (Signed)
Patient did not note much benefit with use of naltrexone and discontinued medication.  We will be planning to recheck labs in relation to upcoming physical.  Patient is still interested in working towards abstinence.  At present, he would prefer to do so on his own without any specific pharmacotherapy

## 2022-06-08 NOTE — Assessment & Plan Note (Signed)
Patient reports that he has been experiencing some exertional chest pain over the past couple months.  Since first noticing, does not feel that symptoms have been progressive.  No other associated symptoms with chest pain, denies any issues with shortness of breath, lightheadedness, dizziness.  Symptoms of chest pain are fairly short-lived and resolved spontaneously. On exam, vital signs stable, patient is in no acute distress.  Cardiovascular exam with regular rate and rhythm, lungs clear to auscultation bilaterally. Discussed concerns given risk factors for patient including history of smoking, hypertension, moderate alcohol use.  Given this, feel that further evaluation with cardiology and likely additional testing would be recommended.  Patient in agreement, referral placed today.  Advised that if chest pain does recur prior to evaluation with cardiology, would recommend evaluation in the emergency department for any acute management which would be warranted

## 2022-06-14 ENCOUNTER — Other Ambulatory Visit (HOSPITAL_BASED_OUTPATIENT_CLINIC_OR_DEPARTMENT_OTHER): Payer: Self-pay

## 2022-06-14 DIAGNOSIS — Z0000 Encounter for general adult medical examination without abnormal findings: Secondary | ICD-10-CM

## 2022-06-14 DIAGNOSIS — N1831 Chronic kidney disease, stage 3a: Secondary | ICD-10-CM

## 2022-06-14 NOTE — Addendum Note (Signed)
Addended by: Katharine Look on: 06/14/2022 02:34 PM   Modules accepted: Orders

## 2022-06-15 LAB — COMPREHENSIVE METABOLIC PANEL
ALT: 22 IU/L (ref 0–44)
AST: 25 IU/L (ref 0–40)
Albumin/Globulin Ratio: 2 (ref 1.2–2.2)
Albumin: 4.6 g/dL (ref 3.9–4.9)
Alkaline Phosphatase: 46 IU/L (ref 44–121)
BUN/Creatinine Ratio: 20 (ref 10–24)
BUN: 23 mg/dL (ref 8–27)
Bilirubin Total: 0.4 mg/dL (ref 0.0–1.2)
CO2: 20 mmol/L (ref 20–29)
Calcium: 10 mg/dL (ref 8.6–10.2)
Chloride: 98 mmol/L (ref 96–106)
Creatinine, Ser: 1.14 mg/dL (ref 0.76–1.27)
Globulin, Total: 2.3 g/dL (ref 1.5–4.5)
Glucose: 133 mg/dL — ABNORMAL HIGH (ref 70–99)
Potassium: 4.2 mmol/L (ref 3.5–5.2)
Sodium: 135 mmol/L (ref 134–144)
Total Protein: 6.9 g/dL (ref 6.0–8.5)
eGFR: 71 mL/min/{1.73_m2} (ref >59)

## 2022-06-15 LAB — TSH RFX ON ABNORMAL TO FREE T4: TSH: 3.04 u[IU]/mL (ref 0.450–4.500)

## 2022-06-18 ENCOUNTER — Other Ambulatory Visit (HOSPITAL_BASED_OUTPATIENT_CLINIC_OR_DEPARTMENT_OTHER): Payer: Medicare HMO

## 2022-07-29 ENCOUNTER — Telehealth (HOSPITAL_BASED_OUTPATIENT_CLINIC_OR_DEPARTMENT_OTHER): Payer: Self-pay | Admitting: Family Medicine

## 2022-07-29 NOTE — Telephone Encounter (Signed)
Patient called in response to message from My chart to schedule AWV.  Patient couldn't schedule AWV because he works full time from 6:30 am to 5:30 pm.

## 2022-07-30 ENCOUNTER — Encounter (HOSPITAL_BASED_OUTPATIENT_CLINIC_OR_DEPARTMENT_OTHER): Payer: Self-pay | Admitting: Family Medicine

## 2022-07-30 ENCOUNTER — Ambulatory Visit (INDEPENDENT_AMBULATORY_CARE_PROVIDER_SITE_OTHER): Payer: Medicare HMO | Admitting: Family Medicine

## 2022-07-30 VITALS — BP 129/80 | HR 79 | Ht 68.0 in | Wt 227.7 lb

## 2022-07-30 DIAGNOSIS — I10 Essential (primary) hypertension: Secondary | ICD-10-CM

## 2022-07-30 DIAGNOSIS — R7303 Prediabetes: Secondary | ICD-10-CM

## 2022-07-30 DIAGNOSIS — E785 Hyperlipidemia, unspecified: Secondary | ICD-10-CM

## 2022-07-30 DIAGNOSIS — F17290 Nicotine dependence, other tobacco product, uncomplicated: Secondary | ICD-10-CM | POA: Insufficient documentation

## 2022-07-30 DIAGNOSIS — N1831 Chronic kidney disease, stage 3a: Secondary | ICD-10-CM

## 2022-07-30 MED ORDER — VARENICLINE TARTRATE 1 MG PO TABS: ORAL_TABLET | ORAL | 0 refills | Status: AC

## 2022-07-30 NOTE — Progress Notes (Signed)
    Procedures performed today:    None.  Independent interpretation of notes and tests performed by another provider:   None.  Brief History, Exam, Impression, and Recommendations:    BP 129/80   Pulse 79   Ht 5\' 8"  (1.727 m)   Wt 227 lb 11.2 oz (103.3 kg)   SpO2 97%   BMI 34.62 kg/m   Essential hypertension Assessment & Plan: Blood pressure borderline in office today, did improve on recheck.  No issues with chest pain, headaches or dizziness.  Patient continues with hydrochlorothiazide and lisinopril given good control of blood pressure, can continue with current medication regimen, no changes made today Recommend intermittent monitoring of blood pressure, DASH diet   Pre-diabetes Assessment & Plan: Most recent labs with hemoglobin A1c of 5.7%.  This has generally remained stable and on reviewing prior labs through care everywhere, patient did have hemoglobin A1c of 5.9% in 2019.  He has questions today about what can be done related to lifestyle modifications or any medications or supplements which can be beneficial We discussed role of lifestyle modifications today.  Discussed specific questions related to over-the-counter supplements and difficulty with accurately evaluating these.  Primarily recommend focusing on lifestyle modifications to help with controlling blood sugars, reducing risk of progression to diabetes   Hyperlipidemia, unspecified hyperlipidemia type Assessment & Plan: Patient has been utilizing rosuvastatin, denies any issues with medication at this time.  Last lipid panel was a couple months ago and was noted to still have slightly elevated LDL, although this is lower compared to prior readings which indicated significantly elevated LDL in the past At this time, can continue with rosuvastatin.  Monitor lipid panel in the future.  If LDL continues to be above goal, may need to consider further evaluation with cardiology/lipid clinic   Vaping nicotine  dependence, tobacco product Assessment & Plan: Patient a former smoker who recently started vaping.  Indicates interest in quitting and has utilized Chantix in the past with good response.  We discussed this as possible aid in smoking cessation.  He would be interested in proceeding with this.  He tolerated medication well in the past.  We again reviewed potential risk, side effects, adverse reactions.  Instructed on proper use.  Prescription sent to pharmacy on file   Other orders -     Varenicline Tartrate; Take 0.5 tablets (0.5 mg total) by mouth daily for 3 days, THEN 0.5 tablets (0.5 mg total) 2 (two) times daily for 4 days, THEN 1 tablet (1 mg total) 2 (two) times daily for 28 days. Start medication 1 week before planned quit date..  Dispense: 62 tablet; Refill: 0  Return in about 2 months (around 09/29/2022) for hypertension, prediabetes.  Spent 34 minutes on this patient encounter, including preparation, chart review, face-to-face counseling with patient and coordination of care, and documentation of encounter   ___________________________________________ Ferman Basilio de Peru, MD, ABFM, [facility] Primary Care and Sports Medicine [facility]

## 2022-07-30 NOTE — Patient Instructions (Signed)
  Medication Instructions:  Your physician recommends that you continue on your current medications as directed. Please refer to the Current Medication list given to you today. --If you need a refill on any your medications before your next appointment, please call your pharmacy first. If no refills are authorized on file call the office.-- Lab Work: Your physician has recommended that you have lab work today: No If you have labs (blood work) drawn today and your tests are completely normal, you will receive your results via MyChart message OR a phone call from our staff.  Please ensure you check your voicemail in the event that you authorized detailed messages to be left on a delegated number. If you have any lab test that is abnormal or we need to change your treatment, we will call you to review the results.  Referrals/Procedures/Imaging: No  Follow-Up: Your next appointment:   Your physician recommends that you schedule a follow-up appointment in 2 months with Dr. de Cuba  You will receive a text message or e-mail with a link to a survey about your care and experience with us today! We would greatly appreciate your feedback!   Thanks for letting us be apart of your health journey!!  Primary Care and Sports Medicine   Dr. Raymond de Cuba   We encourage you to activate your patient portal called "MyChart".  Sign up information is provided on this After Visit Summary.  MyChart is used to connect with patients for Virtual Visits (Telemedicine).  Patients are able to view lab/test results, encounter notes, upcoming appointments, etc.  Non-urgent messages can be sent to your provider as well. To learn more about what you can do with MyChart, please visit --  [email or web address]    

## 2022-07-30 NOTE — Assessment & Plan Note (Signed)
Blood pressure borderline in office today, did improve on recheck.  No issues with chest pain, headaches or dizziness.  Patient continues with hydrochlorothiazide and lisinopril given good control of blood pressure, can continue with current medication regimen, no changes made today Recommend intermittent monitoring of blood pressure, DASH diet

## 2022-07-30 NOTE — Assessment & Plan Note (Signed)
Patient a former smoker who recently started vaping.  Indicates interest in quitting and has utilized Chantix in the past with good response.  We discussed this as possible aid in smoking cessation.  He would be interested in proceeding with this.  He tolerated medication well in the past.  We again reviewed potential risk, side effects, adverse reactions.  Instructed on proper use.  Prescription sent to pharmacy on file

## 2022-07-30 NOTE — Assessment & Plan Note (Signed)
Most recent labs with hemoglobin A1c of 5.7%.  This has generally remained stable and on reviewing prior labs through care everywhere, patient did have hemoglobin A1c of 5.9% in 2019.  He has questions today about what can be done related to lifestyle modifications or any medications or supplements which can be beneficial We discussed role of lifestyle modifications today.  Discussed specific questions related to over-the-counter supplements and difficulty with accurately evaluating these.  Primarily recommend focusing on lifestyle modifications to help with controlling blood sugars, reducing risk of progression to diabetes

## 2022-07-30 NOTE — Assessment & Plan Note (Signed)
Patient has been utilizing rosuvastatin, denies any issues with medication at this time.  Last lipid panel was a couple months ago and was noted to still have slightly elevated LDL, although this is lower compared to prior readings which indicated significantly elevated LDL in the past At this time, can continue with rosuvastatin.  Monitor lipid panel in the future.  If LDL continues to be above goal, may need to consider further evaluation with cardiology/lipid clinic

## 2022-08-06 ENCOUNTER — Encounter (HOSPITAL_BASED_OUTPATIENT_CLINIC_OR_DEPARTMENT_OTHER): Payer: Medicare HMO | Admitting: Family Medicine

## 2022-08-13 ENCOUNTER — Ambulatory Visit (INDEPENDENT_AMBULATORY_CARE_PROVIDER_SITE_OTHER): Payer: Medicare HMO | Admitting: Family Medicine

## 2022-08-13 ENCOUNTER — Encounter (HOSPITAL_BASED_OUTPATIENT_CLINIC_OR_DEPARTMENT_OTHER): Payer: Self-pay | Admitting: Cardiology

## 2022-08-13 ENCOUNTER — Encounter (HOSPITAL_BASED_OUTPATIENT_CLINIC_OR_DEPARTMENT_OTHER): Payer: Self-pay | Admitting: Family Medicine

## 2022-08-13 ENCOUNTER — Ambulatory Visit (INDEPENDENT_AMBULATORY_CARE_PROVIDER_SITE_OTHER): Payer: Medicare HMO | Admitting: Cardiology

## 2022-08-13 VITALS — BP 139/92 | HR 73 | Ht 68.0 in | Wt 230.0 lb

## 2022-08-13 VITALS — BP 132/74 | HR 94 | Ht 68.0 in | Wt 230.0 lb

## 2022-08-13 DIAGNOSIS — Z7189 Other specified counseling: Secondary | ICD-10-CM

## 2022-08-13 DIAGNOSIS — Z0000 Encounter for general adult medical examination without abnormal findings: Secondary | ICD-10-CM

## 2022-08-13 DIAGNOSIS — R079 Chest pain, unspecified: Secondary | ICD-10-CM | POA: Diagnosis not present

## 2022-08-13 DIAGNOSIS — E7800 Pure hypercholesterolemia, unspecified: Secondary | ICD-10-CM

## 2022-08-13 DIAGNOSIS — I10 Essential (primary) hypertension: Secondary | ICD-10-CM | POA: Diagnosis not present

## 2022-08-13 DIAGNOSIS — R072 Precordial pain: Secondary | ICD-10-CM | POA: Diagnosis not present

## 2022-08-13 NOTE — Progress Notes (Signed)
Cardiology Office Note:  .   Date:  08/13/2022  ID:  Eric Delgado, DOB 06/02/1955, MRN [id number] PCP: de Peru, Raymond J, MD  [facility] Providers Cardiologist:  Jodelle Red, MD {  History of Present Illness: .   Eric Delgado is a 67 y.o. male with Pmh hypertension, nicotine use, hyperlipidemia who is seen in consultation today as a new patient at the request of Dr. De Peru for exertional chest pain. Notes reviewed today.   Chest pain: Had an episode when [place] and then climb three flights of stairs. Had left sided chest pressure with shortness of breath. Lasted about about 1-2 minutes. Has not recurred since. Does walk but does not push himself routinely.   -Prior cardiac history: no prior cardiac history -Prior workup: had prior stress test 10-12 years ago, denies heart cath -Alcohol: 2 beers and a margarita during the weekdays, can be 3 beers and 2 margaritas on the weekend -Tobacco: working to stop vaping, on Chantix. Quit smoking >10 years ago -diet: drinks a lot of water, tries to eat salads at lunch but dinner is a struggle. -Comorbidities: hypertension (on meds for years), hyperlipidemia (on rosuvastatin for years). Had leg swelling on amlodipine. -Family history: no heart history that he is aware of   ROS: ROS otherwise negative except as noted.   Studies Reviewed: Marland Kitchen    EKG:     he decline ECG today  Physical Exam:   VS:  BP 132/74   Pulse 94   Ht 5\' 8"  (1.727 m)   Wt 230 lb (104.3 kg)   SpO2 96%   BMI 34.97 kg/m    Wt Readings from Last 3 Encounters:  08/13/22 230 lb (104.3 kg)  07/30/22 227 lb 11.2 oz (103.3 kg)  06/01/22 229 lb (103.9 kg)    GEN: Well nourished, well developed in no acute distress HEENT: Normal, moist mucous membranes NECK: No JVD CARDIAC: regular rhythm, normal S1 and S2, no rubs or gallops. No murmur. VASCULAR: Radial and DP pulses 2+ bilaterally. No carotid bruits RESPIRATORY:   Clear to auscultation without rales, wheezing or rhonchi  ABDOMEN: Soft, non-tender, non-distended MUSCULOSKELETAL:  Ambulates independently SKIN: Warm and dry, no edema NEUROLOGIC:  Alert and oriented x 3. No focal neuro deficits noted. PSYCHIATRIC:  Normal affect    ASSESSMENT AND PLAN: .   Chest pain -discussed treadmill stress, nuclear stress/lexiscan, and CT coronary angiography. Discussed pros and cons of each, including but not limited to false positive/false negative risk, radiation risk, and risk of IV contrast dye. Based on shared decision making, decision was made to pursue treadmill and then CT coronary angiography if treadmill abnormal. -if needs CT, Last Cr 1.1 but has been 1.47 in the past -reviewed red flag warning signs that need immediate medical attention   Hypertension -had leg swelling on amlodipine in the past -on lisinopril, hydrochlorothiazide -watches his salt  Hyperlipidemia -on aspirin, rosuvastatin  Nicotine use -working to quit vaping, on chantix  CV risk counseling and prevention -recommend heart healthy/Mediterranean diet, with whole grains, fruits, vegetable, fish, lean meats, nuts, and olive oil. Limit salt. -recommend moderate walking, 3-5 times/week for 30-50 minutes each session. Aim for at least 150 minutes.week. Goal should be pace of 3 miles/hours, or walking 1.5 miles in 30 minutes -recommend avoidance of tobacco products. Avoid excess alcohol. -ASCVD risk score: The 10-year ASCVD risk score (Arnett DK, et al., 2019) is: 13.8%   Values used to calculate  the score:     Age: 89 years     Sex: Male     Is Non-Hispanic African American: No     Diabetic: No     Tobacco smoker: No     Systolic Blood Pressure: 132 mmHg     Is BP treated: Yes     HDL Cholesterol: 61 mg/dL     Total Cholesterol: 198 mg/dL    Dispo: to be determined based on results of stress test  Informed Consent   Shared Decision Making/Informed Consent The risks [chest  pain, shortness of breath, cardiac arrhythmias, dizziness, blood pressure fluctuations, myocardial infarction, stroke/transient ischemic attack, and life-threatening complications (estimated to be 1 in 10,000)], benefits (risk stratification, diagnosing coronary artery disease, treatment guidance) and alternatives of an exercise tolerance test were discussed in detail with [name] and he agrees to proceed.      Signed, Jodelle Red, MD   Jodelle Red, MD, PhD, [facility]  [facility]    Heart & Vascular at [facility] at [facility] [address], [address] [place], [place] [address] [phone]

## 2022-08-13 NOTE — Patient Instructions (Addendum)
  Medication Instructions:  Your physician recommends that you continue on your current medications as directed. Please refer to the Current Medication list given to you today. --If you need a refill on any your medications before your next appointment, please call your pharmacy first. If no refills are authorized on file call the office.-- Lab Work: Your physician has recommended that you have lab work today: No If you have labs (blood work) drawn today and your tests are completely normal, you will receive your results via MyChart message OR a phone call from our staff.  Please ensure you check your voicemail in the event that you authorized detailed messages to be left on a delegated number. If you have any lab test that is abnormal or we need to change your treatment, we will call you to review the results.  Referrals/Procedures/Imaging: No  Follow-Up: Your next appointment:   Your physician recommends that you schedule a follow-up appointment in: August 16 as scheduled with Dr. de Peru  You will receive a text message or e-mail with a link to a survey about your care and experience with [place] today! We would greatly appreciate your feedback!   Thanks for letting us be apart of your health journey!!  Primary Care and Sports Medicine   Dr. Ceasar Mons Peru   We encourage you to activate your patient portal called "MyChart".  Sign up information is provided on this After Visit Summary.  MyChart is used to connect with patients for Virtual Visits (Telemedicine).  Patients are able to view lab/test results, encounter notes, upcoming appointments, etc.  Non-urgent messages can be sent to your provider as well. To learn more about what you can do with MyChart, please visit --  [email or web address].

## 2022-08-13 NOTE — Assessment & Plan Note (Signed)
IPPE completed today as above. Discussed screening EKG, patient would like to proceed with this, completed in office today He does have upcoming evaluation with cardiology, likely plan to do stress test after obtaining EKG today.  Advised on maintaining close contact with cardiology to continue with recommended testing and evaluation

## 2022-08-13 NOTE — Progress Notes (Signed)
Annual Wellness Visit   Patient: Eric Delgado, Male    DOB: 04/07/1955, 67 y.o.   MRN: [id number]  Subjective  Chief Complaint  Patient presents with   Medicare Wellness    Pt is here today for his welcome to medicare visit.    Eric Delgado is a 67 y.o. male who presents today for his Annual Wellness Visit. He reports consuming a general diet. Exercise is limited by self. He generally feels fairly well. He reports sleeping fairly well. He does not have additional problems to discuss today.  Past Medical History:  Diagnosis Date   Difficulty sleeping    takes xanax for sleep   History of hiatal hernia    Hyperlipidemia    Hypertension    Osteoarthritis, multiple sites 12/21/2013   SCIATICA, RIGHT 10/12/2007   Qualifier: Diagnosis of  By: Debby Bud MD, Rosalyn Gess    Past Surgical History:  Procedure Laterality Date   APPENDECTOMY  1975   CATARACT EXTRACTION Bilateral 02/2015   [facility]   COLONOSCOPY  10/2010   WNL, rpt 10 yrs ([facility]   COLONOSCOPY  04/2020   TA, SSP, diverticulosis, rpt 5 yrs ([facility])   ROTATOR CUFF REPAIR     left   TOTAL HIP ARTHROPLASTY Right Spring '12   Dr. Charlann Boxer - ceramic ball, fiber stem   TOTAL HIP ARTHROPLASTY Left 12/21/2013   Procedure: LEFT TOTAL HIP ARTHROPLASTY ANTERIOR APPROACH;  Surgeon: Kathryne Hitch, MD;  Location: [facility];  Service: Orthopedics;  Laterality: Left;   TOTAL KNEE ARTHROPLASTY Left 05/20/2020   Procedure: LEFT TOTAL KNEE ARTHROPLASTY;  Surgeon: Kathryne Hitch, MD;  Location: [facility] OR;  Service: Orthopedics;  Laterality: Left;   VASECTOMY     Social History   Tobacco Use   Smoking status: Former    Packs/day: 0.50    Years: 30.00    Additional pack years: 0.00    Total pack years: 15.00    Types: Cigarettes    Quit date: 01/15/2010    Years since quitting: 12.5   Smokeless tobacco: Never  Vaping Use   Vaping Use: Some days  Substance Use Topics    Alcohol use: Yes    Comment: 3 beers and 1 mixed drink each night   Drug use: No   Family History  Problem Relation Age of Onset   Macular degeneration Mother    Hypertension Mother    Hyperlipidemia Mother    Cancer Mother 73       lung (small cell), smoker    Stroke Maternal Aunt    Diabetes Neg Hx    CAD Neg Hx    Allergies  Allergen Reactions   Prozac [Fluoxetine Hcl] Other (See Comments)    Didn't feel well with this   Tramadol     Sick on Stomach + blood pressure goes up.     Medications: Outpatient Medications Prior to Visit  Medication Sig   aspirin 81 MG chewable tablet Chew 1 tablet (81 mg total) by mouth 2 (two) times daily. (Patient taking differently: Chew 81 mg by mouth. Every 3 weeks)   hydrochlorothiazide (HYDRODIURIL) 25 MG tablet Take 1 tablet (25 mg total) by mouth daily.   lisinopril (ZESTRIL) 40 MG tablet Take 1 tablet (40 mg total) by mouth daily.   Multiple Vitamin (MULTIVITAMIN WITH MINERALS) TABS tablet Take 1 tablet by mouth every morning.   rosuvastatin (CRESTOR) 20 MG tablet Take 1 tablet (20 mg total) by  mouth at bedtime.   traZODone (DESYREL) 50 MG tablet TAKE 1/2 TO 1 TABLET AT BEDTIME AS NEEDED FOR SLEEP   varenicline (CHANTIX) 1 MG tablet Take 0.5 tablets (0.5 mg total) by mouth daily for 3 days, THEN 0.5 tablets (0.5 mg total) 2 (two) times daily for 4 days, THEN 1 tablet (1 mg total) 2 (two) times daily for 28 days. Start medication 1 week before planned quit date..   No facility-administered medications prior to visit.    Allergies  Allergen Reactions   Prozac [Fluoxetine Hcl] Other (See Comments)    Didn't feel well with this   Tramadol     Sick on Stomach + blood pressure goes up.     Patient Care Team: de Peru, Buren Kos, MD as PCP - General (Family Medicine) Jodelle Red, MD as PCP - Cardiology (Cardiology) Durene Romans, MD (Orthopedic Surgery)   Objective  BP (!) 139/92 (BP Location: Left Arm, Patient Position:  Sitting, Cuff Size: Large)   Pulse 73   Ht 5\' 8"  (1.727 m)   Wt 230 lb (104.3 kg)   SpO2 98%   BMI 34.97 kg/m     Most recent functional status assessment:    08/13/2022    9:54 AM  In your present state of health, do you have any difficulty performing the following activities:  Hearing? 1  Comment Has hearing aids but does not wear them.  Vision? 0  Difficulty concentrating or making decisions? 1  Walking or climbing stairs? 0  Dressing or bathing? 0  Doing errands, shopping? 0   Most recent fall risk assessment:    08/13/2022    9:57 AM  Fall Risk   Falls in the past year? 0  Number falls in past yr: 0  Injury with Fall? 0  Risk for fall due to : No Fall Risks  Follow up Falls evaluation completed    Most recent depression screenings:    08/13/2022    9:57 AM 07/30/2022    8:44 AM  PHQ 2/9 Scores  PHQ - 2 Score 0 0  PHQ- 9 Score 6 6   Most recent cognitive screening:    08/13/2022    9:55 AM  6CIT Screen  What Year? 0 points  What month? 0 points  What time? 0 points  Count back from 20 0 points  Months in reverse 0 points  Repeat phrase 0 points  Total Score 0 points   Most recent Audit-C alcohol use screening    07/30/2022    8:39 AM  Alcohol Use Disorder Test (AUDIT)  1. How often do you have a drink containing alcohol? 4  2. How many drinks containing alcohol do you have on a typical day when you are drinking? 1  3. How often do you have six or more drinks on one occasion? 0  AUDIT-C Score 5  4. How often during the last year have you found that you were not able to stop drinking once you had started? 1  5. How often during the last year have you failed to do what was normally expected from you because of drinking? 0  6. How often during the last year have you needed a first drink in the morning to get yourself going after a heavy drinking session? 0  7. How often during the last year have you had a feeling of guilt of remorse after drinking? 4  8.  How often during the last year have you been unable to  remember what happened the night before because you had been drinking? 0  9. Have you or someone else been injured as a result of your drinking? 0  10. Has a relative or friend or a doctor or another health worker been concerned about your drinking or suggested you cut down? 4  Alcohol Use Disorder Identification Test Final Score (AUDIT) 14   A score of 3 or more in women, and 4 or more in men indicates increased risk for alcohol abuse, EXCEPT if all of the points are from question 1   Vision/Hearing Screen: No results found.  No results found for any visits on 08/13/22.    Assessment & Plan   Annual wellness visit done today including the all of the following: Reviewed patient's Family Medical History Reviewed and updated list of patient's medical providers Assessment of cognitive impairment was done Assessed patient's functional ability Established a written schedule for health screening services Health Risk Assessent Completed and Reviewed Has Power of Attorney and Will in place. We discussed considerations related to resuscitation status - he would like to remain full code at this time.  Exercise Activities and Dietary recommendations  Goals   None     Immunization History  Administered Date(s) Administered   Influenza Inj Mdck Quad Pf 12/24/2016, 12/09/2017   Influenza,inj,Quad PF,6+ Mos 03/21/2015, 12/24/2016, 12/09/2017   Influenza-Unspecified 11/29/2013, 12/23/2018   PFIZER(Purple Top)SARS-COV-2 Vaccination 09/14/2019, 10/05/2019   Td 01/18/2008   Tdap 11/25/2018   Zoster Recombinat (Shingrix) 09/22/2018, 11/25/2018    Health Maintenance  Topic Date Due   COVID-19 Vaccine (3 - 2023-24 season) 09/27/2022 (Originally 10/16/2021)   HIV Screening  06/01/2023 (Originally 10/10/1970)   Pneumonia Vaccine 59+ Years old (1 of 2 - PCV) 06/15/2023 (Originally 10/10/1962)   INFLUENZA VACCINE  09/16/2022   Medicare Annual  Wellness (AWV)  08/13/2023   DTaP/Tdap/Td (3 - Td or Tdap) 11/24/2028   Colonoscopy  05/15/2030   Hepatitis C Screening  Completed   Zoster Vaccines- Shingrix  Completed   HPV VACCINES  Aged Out   Discussed health benefits of physical activity, and encouraged him to engage in regular exercise appropriate for his age and condition.   Problem List Items Addressed This Visit       Other   Encounter for Medicare annual wellness exam - Primary    IPPE completed today as above. Discussed screening EKG, patient would like to proceed with this, completed in office today He does have upcoming evaluation with cardiology, likely plan to do stress test after obtaining EKG today.  Advised on maintaining close contact with cardiology to continue with recommended testing and evaluation       Return if symptoms worsen or fail to improve, for already scheduled.     Edwyna Dangerfield J De Peru, MD

## 2022-08-13 NOTE — Patient Instructions (Signed)
Medication Instructions:  Your physician recommends that you continue on your current medications as directed. Please refer to the Current Medication list given to you today.  *If you need a refill on your cardiac medications before your next appointment, please call your pharmacy*   Testing/Procedures: Your physician has requested that you have an exercise tolerance test. For further information please visit [email or web address] Please also follow instruction sheet, as given.   Follow-Up: At [facility], you and your health needs are our priority.  As part of our continuing mission to provide you with exceptional heart care, we have created designated Provider Care Teams.  These Care Teams include your primary Cardiologist (physician) and Advanced Practice Providers (APPs -  Physician Assistants and Nurse Practitioners) who all work together to provide you with the care you need, when you need it.   Your next appointment:   To be determined following stress test   Provider:   Jodelle Red, MD

## 2022-08-20 ENCOUNTER — Ambulatory Visit (HOSPITAL_BASED_OUTPATIENT_CLINIC_OR_DEPARTMENT_OTHER): Payer: Medicare HMO | Admitting: Family Medicine

## 2022-09-10 ENCOUNTER — Other Ambulatory Visit (HOSPITAL_BASED_OUTPATIENT_CLINIC_OR_DEPARTMENT_OTHER): Payer: Self-pay | Admitting: Family Medicine

## 2022-09-13 ENCOUNTER — Telehealth (HOSPITAL_BASED_OUTPATIENT_CLINIC_OR_DEPARTMENT_OTHER): Payer: Self-pay | Admitting: Family Medicine

## 2022-09-13 ENCOUNTER — Other Ambulatory Visit (HOSPITAL_BASED_OUTPATIENT_CLINIC_OR_DEPARTMENT_OTHER): Payer: Self-pay

## 2022-09-13 MED ORDER — LISINOPRIL 40 MG PO TABS: 40.0000 mg | ORAL_TABLET | Freq: Every day | ORAL | 0 refills | Status: DC

## 2022-09-13 MED ORDER — HYDROCHLOROTHIAZIDE 25 MG PO TABS: 25.0000 mg | ORAL_TABLET | Freq: Every day | ORAL | 0 refills | Status: DC

## 2022-09-13 NOTE — Telephone Encounter (Signed)
Meds have been sent in  

## 2022-09-13 NOTE — Telephone Encounter (Signed)
Please call in Blood Pressure mediation --he has already called the pharmacy

## 2022-09-14 ENCOUNTER — Encounter (HOSPITAL_BASED_OUTPATIENT_CLINIC_OR_DEPARTMENT_OTHER): Payer: Self-pay | Admitting: Family Medicine

## 2022-09-14 ENCOUNTER — Ambulatory Visit (INDEPENDENT_AMBULATORY_CARE_PROVIDER_SITE_OTHER): Payer: Medicare HMO | Admitting: Family Medicine

## 2022-09-14 VITALS — BP 126/68 | HR 78 | Ht 68.0 in | Wt 228.0 lb

## 2022-09-14 DIAGNOSIS — M25561 Pain in right knee: Secondary | ICD-10-CM

## 2022-09-14 DIAGNOSIS — F17290 Nicotine dependence, other tobacco product, uncomplicated: Secondary | ICD-10-CM

## 2022-09-14 MED ORDER — MELOXICAM 15 MG PO TABS: 15.0000 mg | ORAL_TABLET | Freq: Every day | ORAL | 1 refills | Status: DC

## 2022-09-14 MED ORDER — TRAZODONE HCL 50 MG PO TABS: ORAL_TABLET | ORAL | 0 refills | Status: DC

## 2022-09-14 NOTE — Assessment & Plan Note (Signed)
Patient reports that he did start Chantix, however feels that the generic form that he received did not work as well has brand-name that he utilize in the past.  He notes having issues with treatment dose of medication, thus returned to lower dose and has been continuing with this.  He has been about 1 month without smoking.  We discussed options, given progress thus far, can continue with current regimen, recommend continuing with smoking cessation, coagulated on progress thus far

## 2022-09-14 NOTE — Assessment & Plan Note (Signed)
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  He subsequently developed pain and bruising over posterior and medial aspect of right knee.  Had some mild swelling posteriorly.  Denies any instability.  Pain worse with walking, stairs.  Has not tried any specific treatments thus far.  Denies any history of right knee injuries or issues Right knee: No obvious deformity. Small effusion.  Negative patellar grind.  Negative crepitus.  Patient with some tenderness to palpation through posterior aspect of knee, particularly along distal aspect of the hamstring.  No obvious skin changes at this time Full ROM for flexion and extension.  Strength 5 out of 5 for flexion and extension. Anterior drawer: Negative Posterior drawer: Negative Lachman: Negative Varus stress test: Negative Valgus stress test: Negative McMurray's: Negative, however difficult to fully assess due to some guarding on exam Neurovascularly intact.  No evidence of lymphatic disease.  Suspect soft tissue injury, however cannot completely exclude underlying meniscal injury,.  Difficult to fully assess meniscus given guarding on exam.  Discussed general considerations, recommendation.  Given that patient has been doing well, can continue with conservative measures and monitoring for now.  Can utilize meloxicam for pain relief, prescription sent to pharmacy.  Can also proceed with icing.  Discussed recommendation for further evaluation with PT, patient indicates that he is not interested in PT.  He reports that he did not have PT with his left knee replacement in the past and work on home exercises. He does also reference that he had a lot of difficulty with recovery related to his left knee replacement.  Will plan for follow-up in the next few months to monitor progress or sooner as needed.  If symptoms do persist or if any worsening does occur, recommend  proceeding with x-rays, x-ray order has been placed

## 2022-09-14 NOTE — Progress Notes (Signed)
    Procedures performed today:    None.  Independent interpretation of notes and tests performed by another provider:   None.  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  He subsequently developed pain and bruising over posterior and medial aspect of right knee.  Had some mild swelling posteriorly.  Denies any instability.  Pain worse with walking, stairs.  Has not tried any specific treatments thus far.  Denies any history of right knee injuries or issues Right knee: No obvious deformity. Small effusion.  Negative patellar grind.  Negative crepitus.  Patient with some tenderness to palpation through posterior aspect of knee, particularly along distal aspect of the hamstring.  No obvious skin changes at this time Full ROM for flexion and extension.  Strength 5 out of 5 for flexion and extension. Anterior drawer: Negative Posterior drawer: Negative Lachman: Negative Varus stress test: Negative Valgus stress test: Negative McMurray's: Negative, however difficult to fully assess due to some guarding on exam Neurovascularly intact.  No evidence of lymphatic disease.  Suspect soft tissue injury, however cannot completely exclude underlying meniscal injury,.  Difficult to fully assess meniscus given guarding on exam.  Discussed general considerations, recommendation.  Given that patient has been doing well, can continue with conservative measures and monitoring for now.  Can utilize meloxicam for pain relief, prescription sent to pharmacy.  Can also proceed with icing.  Discussed recommendation for further evaluation with PT, patient  indicates that he is not interested in PT.  He reports that he did not have PT with his left knee replacement in the past and work on home exercises. He does also reference that he had a lot of difficulty with recovery related to his left knee replacement.  Will plan for follow-up in the next few months to monitor progress or sooner as needed.  If symptoms do persist or if any worsening does occur, recommend proceeding with x-rays, x-ray order has been placed  Orders: -     DG Knee Complete 4 Views Right; Future  Vaping nicotine dependence, tobacco product Assessment & Plan: Patient reports that he did start Chantix, however feels that the generic form that he received did not work as well has brand-name that he utilize in the past.  He notes having issues with treatment dose of medication, thus returned to lower dose and has been continuing with this.  He has been about 1 month without smoking.  We discussed options, given progress thus far, can continue with current regimen, recommend continuing with smoking cessation, coagulated on progress thus far   Other orders -     Meloxicam; Take 1 tablet (15 mg total) by mouth daily.  Dispense: 30 tablet; Refill: 1 -     traZODone HCl; TAKE 1/2 TO 1 TABLET AT BEDTIME AS NEEDED FOR SLEEP  Dispense: 90 tablet; Refill: 0  Return in about 4 months (around 01/15/2023).   ___________________________________________ Kaedynce Tapp de [place], MD, ABFM, CAQSM Primary Care and Sports Medicine [facility]

## 2022-09-30 ENCOUNTER — Telehealth: Payer: Self-pay | Admitting: Cardiology

## 2022-09-30 ENCOUNTER — Telehealth (HOSPITAL_BASED_OUTPATIENT_CLINIC_OR_DEPARTMENT_OTHER): Payer: Self-pay | Admitting: Family Medicine

## 2022-09-30 DIAGNOSIS — E669 Obesity, unspecified: Secondary | ICD-10-CM

## 2022-09-30 DIAGNOSIS — I10 Essential (primary) hypertension: Secondary | ICD-10-CM

## 2022-09-30 DIAGNOSIS — R7303 Prediabetes: Secondary | ICD-10-CM

## 2022-09-30 NOTE — Telephone Encounter (Signed)
FYI

## 2022-09-30 NOTE — Telephone Encounter (Signed)
Called to schedule patient for GXT test and he said he has torn his hamstrings and will not be able to do this test right now. Asked to be called back in 2 months.

## 2022-09-30 NOTE — Telephone Encounter (Signed)
Dr. De Peru, please advise on this for pt.

## 2022-09-30 NOTE — Telephone Encounter (Signed)
Pt called states he and provider discussed Wt loss meds at last OV but he decline(pt reversed decision and now wants Dr. Clenton Pare to prescribe medicatio. Pt is aware Ins doesn't cover Rx.

## 2022-10-01 ENCOUNTER — Ambulatory Visit (HOSPITAL_BASED_OUTPATIENT_CLINIC_OR_DEPARTMENT_OTHER): Payer: Medicare HMO | Admitting: Family Medicine

## 2022-10-03 ENCOUNTER — Encounter (HOSPITAL_BASED_OUTPATIENT_CLINIC_OR_DEPARTMENT_OTHER): Payer: Self-pay | Admitting: Family Medicine

## 2022-10-03 DIAGNOSIS — E66811 Obesity, class 1: Secondary | ICD-10-CM

## 2022-10-03 DIAGNOSIS — E669 Obesity, unspecified: Secondary | ICD-10-CM

## 2022-10-03 DIAGNOSIS — I10 Essential (primary) hypertension: Secondary | ICD-10-CM

## 2022-10-03 DIAGNOSIS — R7303 Prediabetes: Secondary | ICD-10-CM

## 2022-10-04 MED ORDER — OZEMPIC (0.25 OR 0.5 MG/DOSE) 2 MG/1.5ML ~~LOC~~ SOPN
0.2500 mg | PEN_INJECTOR | SUBCUTANEOUS | 1 refills | Status: DC
Start: 2022-10-04 — End: 2022-10-22

## 2022-10-04 MED ORDER — WEGOVY 0.25 MG/0.5ML ~~LOC~~ SOAJ
0.2500 mg | SUBCUTANEOUS | 1 refills | Status: DC
Start: 2022-10-04 — End: 2022-10-04

## 2022-10-04 NOTE — Telephone Encounter (Signed)
Patient calling back to let you know ozempic is the approved medication not wegovy. He wanted to clarify.

## 2022-10-22 MED ORDER — OZEMPIC (0.25 OR 0.5 MG/DOSE) 2 MG/1.5ML ~~LOC~~ SOPN
0.2500 mg | PEN_INJECTOR | SUBCUTANEOUS | 1 refills | Status: DC
Start: 2022-10-22 — End: 2022-11-01

## 2022-11-01 ENCOUNTER — Other Ambulatory Visit (HOSPITAL_BASED_OUTPATIENT_CLINIC_OR_DEPARTMENT_OTHER): Payer: Self-pay | Admitting: *Deleted

## 2022-11-01 DIAGNOSIS — R7303 Prediabetes: Secondary | ICD-10-CM

## 2022-11-01 DIAGNOSIS — I10 Essential (primary) hypertension: Secondary | ICD-10-CM

## 2022-11-01 DIAGNOSIS — E669 Obesity, unspecified: Secondary | ICD-10-CM

## 2022-11-01 MED ORDER — OZEMPIC (0.25 OR 0.5 MG/DOSE) 2 MG/1.5ML ~~LOC~~ SOPN: 0.2500 mg | PEN_INJECTOR | SUBCUTANEOUS | 1 refills | Status: DC

## 2022-12-05 ENCOUNTER — Other Ambulatory Visit (HOSPITAL_BASED_OUTPATIENT_CLINIC_OR_DEPARTMENT_OTHER): Payer: Self-pay | Admitting: Family Medicine

## 2022-12-07 ENCOUNTER — Ambulatory Visit: Payer: Medicare HMO | Attending: Cardiology

## 2022-12-07 DIAGNOSIS — R072 Precordial pain: Secondary | ICD-10-CM | POA: Diagnosis not present

## 2022-12-07 LAB — EXERCISE TOLERANCE TEST
Angina Index: 0
Base ST Depression (mm): 0 mm
Estimated workload: 7
Exercise duration (min): 6 min
Exercise duration (sec): 0 s
MPHR: 154 {beats}/min
Peak HR: 134 {beats}/min
Percent HR: 87 %
RPE: 17
Rest HR: 78 {beats}/min

## 2022-12-13 ENCOUNTER — Telehealth: Payer: Self-pay | Admitting: Cardiology

## 2022-12-13 NOTE — Telephone Encounter (Signed)
Called patient back. Informed of no provider in office at this time; as soon as provider reviewed results, office will call him back with results.

## 2022-12-13 NOTE — Telephone Encounter (Signed)
Pt would like to speak to a nurse regarding a stress test he had done on 10/22. Please advise

## 2022-12-23 ENCOUNTER — Telehealth (HOSPITAL_BASED_OUTPATIENT_CLINIC_OR_DEPARTMENT_OTHER): Payer: Self-pay | Admitting: Cardiology

## 2022-12-23 NOTE — Telephone Encounter (Signed)
Patient called to follow-up on test results. 

## 2022-12-23 NOTE — Telephone Encounter (Signed)
Spoke with patient and reviewed results Patient denies any chest pain/tightness stating only had the one time. Per patient he stopped the test because person performing wanted to not because he requested. Offered follow up appointment in few months to see how he was doing at that time, patient refused appointment

## 2022-12-23 NOTE — Telephone Encounter (Signed)
See updated result note for ETT  "Stress test was equivocal. Only able to exercise for 6 minutes and per protocol would prefer 9 minutes for sufficient data.   Per Dr. Cristal Deer note, "decision was made to pursue treadmill and then CT coronary angiography if treadmill abnormal".   If still having chest pain, would recommend cardiac CTA for further evaluation.  If no chest pain, no indication for further workup. "  Alver Sorrow, NP

## 2023-01-03 ENCOUNTER — Encounter: Payer: Self-pay | Admitting: Physician Assistant

## 2023-01-03 ENCOUNTER — Other Ambulatory Visit (INDEPENDENT_AMBULATORY_CARE_PROVIDER_SITE_OTHER): Payer: Self-pay

## 2023-01-03 ENCOUNTER — Ambulatory Visit: Payer: Medicare HMO | Admitting: Physician Assistant

## 2023-01-03 DIAGNOSIS — M1711 Unilateral primary osteoarthritis, right knee: Secondary | ICD-10-CM

## 2023-01-03 MED ORDER — LIDOCAINE HCL 1 % IJ SOLN
3.0000 mL | INTRAMUSCULAR | Status: AC | PRN
Start: 2023-01-03 — End: 2023-01-03
  Administered 2023-01-03: 3 mL

## 2023-01-03 MED ORDER — METHYLPREDNISOLONE ACETATE 40 MG/ML IJ SUSP
40.0000 mg | INTRAMUSCULAR | Status: AC | PRN
Start: 2023-01-03 — End: 2023-01-03
  Administered 2023-01-03: 40 mg via INTRA_ARTICULAR

## 2023-01-03 NOTE — Progress Notes (Signed)
   Procedure Note  Patient: Eric Delgado             Date of Birth: 28-Apr-1955           MRN: [id number]             Visit Date: 01/03/2023 HPI: Mr. Jonassen comes in today due to right knee pain.  Reports that he fell in a hole while carrying a metal pole on 12/13/2022.  He does have a history of a left total knee arthroplasty 05/20/2020.  He states he has been taking meloxicam ibuprofen for the knee pain.  Is also taking some oxycodone that he had leftover from previous surgery.  Notes most the pain is medial aspect of the right knee causes him to limp.  Notes swelling in the knee. He notes he is also having some pain that radiates into his lower abdomen left hip region whenever he sneezes only.  History of left total hip arthroplasty 12/21/2013.  Review of systems: He is nondiabetic.  No fevers chills.  Physical exam: General Well-developed well-nourished male in no acute distress.  Ambulates without any assistive device. Left knee: Good range of motion without pain.  Right knee good range of motion.  Tenderness along medial joint line.  Positive effusion no abnormal warmth erythema. Left hip: Good range of motion without pain. Abdomen: Large ventral hernia.  Radiographs:Right knee 2 views: Knee is well located.  Moderate narrowing medial joint line.  Mild to moderate changes patellofemoral compartment.  Lateral compartment overall well-preserved.  No acute fractures or acute findings.  Procedures: Visit Diagnoses:  1. Primary osteoarthritis of right knee     Large Joint Inj: R knee on 01/03/2023 5:33 PM Indications: pain Details: 22 G 1.5 in needle, anterolateral approach  Arthrogram: No  Medications: 3 mL lidocaine 1 %; 40 mg methylPREDNISolone acetate 40 MG/ML Aspirate: 45 mL yellow Outcome: tolerated well, no immediate complications Procedure, treatment alternatives, risks and benefits explained, specific risks discussed. Consent was given by the patient. Immediately prior to  procedure a time out was called to verify the correct patient, procedure, equipment, support staff and site/side marked as required. Patient was prepped and draped in the usual sterile fashion.    Plan: Advised him to stop his NSAIDs as he does have kidney disease.  Also would not recommend Tylenol as he does drink daily.  He will talk to his primary care physician about pain control options.  Did speak with him about sending it to her pain management clinic he defers.  Regards to the pain that he only has in the lower left abdominal area when sneezing we will refer him to general surgery for evaluation hernias possible source.  He will follow-up with [place] as needed.  Questions were encouraged and answered at length.

## 2023-01-04 ENCOUNTER — Other Ambulatory Visit: Payer: Self-pay | Admitting: Radiology

## 2023-01-04 DIAGNOSIS — R1032 Left lower quadrant pain: Secondary | ICD-10-CM

## 2023-01-06 ENCOUNTER — Encounter (HOSPITAL_BASED_OUTPATIENT_CLINIC_OR_DEPARTMENT_OTHER): Payer: Self-pay | Admitting: Family Medicine

## 2023-01-21 ENCOUNTER — Ambulatory Visit (INDEPENDENT_AMBULATORY_CARE_PROVIDER_SITE_OTHER): Payer: Medicare HMO | Admitting: Family Medicine

## 2023-01-21 ENCOUNTER — Encounter (HOSPITAL_BASED_OUTPATIENT_CLINIC_OR_DEPARTMENT_OTHER): Payer: Self-pay | Admitting: Family Medicine

## 2023-01-21 VITALS — BP 139/79 | HR 79 | Ht 68.0 in | Wt 227.3 lb

## 2023-01-21 DIAGNOSIS — E66811 Obesity, class 1: Secondary | ICD-10-CM

## 2023-01-21 DIAGNOSIS — I10 Essential (primary) hypertension: Secondary | ICD-10-CM | POA: Diagnosis not present

## 2023-01-21 DIAGNOSIS — Z0000 Encounter for general adult medical examination without abnormal findings: Secondary | ICD-10-CM

## 2023-01-21 DIAGNOSIS — R7989 Other specified abnormal findings of blood chemistry: Secondary | ICD-10-CM | POA: Insufficient documentation

## 2023-01-21 DIAGNOSIS — K5730 Diverticulosis of large intestine without perforation or abscess without bleeding: Secondary | ICD-10-CM | POA: Insufficient documentation

## 2023-01-21 MED ORDER — ROSUVASTATIN CALCIUM 20 MG PO TABS: 20.0000 mg | ORAL_TABLET | Freq: Every day | ORAL | 0 refills | Status: DC

## 2023-01-21 MED ORDER — MELOXICAM 7.5 MG PO TABS: 7.5000 mg | ORAL_TABLET | Freq: Every day | ORAL | 1 refills | Status: DC

## 2023-01-21 MED ORDER — LISINOPRIL 40 MG PO TABS: 40.0000 mg | ORAL_TABLET | Freq: Every day | ORAL | 0 refills | Status: DC

## 2023-01-21 MED ORDER — HYDROCHLOROTHIAZIDE 25 MG PO TABS: 25.0000 mg | ORAL_TABLET | Freq: Every day | ORAL | 0 refills | Status: DC

## 2023-01-21 NOTE — Progress Notes (Unsigned)
Procedures performed today:    None.  Independent interpretation of notes and tests performed by another provider:   None.  Brief History, Exam, Impression, and Recommendations:    BP 139/79 (BP Location: Right Arm, Patient Position: Sitting, Cuff Size: Normal)   Pulse 79   Ht 5\' 8"  (1.727 m)   Wt 227 lb 4.8 oz (103.1 kg)   SpO2 97%   BMI 34.56 kg/m   Elevated serum creatinine Assessment & Plan: Patient had labs completed earlier this year through cardiology which showed elevated serum creatinine.  Creatinine was found to be 1.47 in April 2024 with prior labs indicating baseline near 1.1.  He did have follow-up labs a few weeks later and creatinine had returned close to baseline at 1.14.  With elevation at 1.47, corresponding EGFR was found to be 67.  He has questions and concerns today as he was told by his cardiologist that labs in early April indicated stage III CKD.  He has had a lot of concern related to this information. Long discussion with patient today related to labs, subsequent follow-up labs which showed creatinine returning to baseline.  Discussed that the one-time elevation observed with labs in early April would not necessarily indicate underlying CKD at this time, particularly not at stage III level.  Discussed that there can be some variability with creatinine level and that when this does increase intermittently, this can indicate a short-term/acute issue that can result in the increase and subsequent return to baseline.  We discussed various things that can affect this such as dehydration or acute illness.  Currently, history and labs would not suggest present CKD, particularly at stage III severity.  Discussed this with patient.  Did review general considerations related to controlling for any possible risk factors such as ensuring adequate hydration, moderation of medications which can affect kidney function such as NSAIDs, maintaining appropriate control of blood  pressure.   Essential hypertension Assessment & Plan: Blood pressure borderline in office today, did improve on recheck.  No issues with chest pain, headaches or dizziness.  Patient continues with hydrochlorothiazide and lisinopril given good control of blood pressure, can continue with current medication regimen, no changes made today Recommend intermittent monitoring of blood pressure, DASH diet   Obesity, Class I, BMI 30-34.9 Assessment & Plan: Patient with elevated BMI, greater than 30, indicating obesity.  He does have questions and concerns regarding management.  After long discussion, patient elected to proceed with referral to healthy weight and wellness clinic for consideration of following with them in regards to treatment management, referral placed today  Orders: -     Amb Ref to Medical Weight Management  Wellness examination -     CBC with Differential/Platelet; Future -     Comprehensive metabolic panel; Future -     Hemoglobin A1c; Future -     Lipid panel; Future -     TSH Rfx on Abnormal to Free T4; Future  Other orders -     Meloxicam; Take 1 tablet (7.5 mg total) by mouth daily.  Dispense: 30 tablet; Refill: 1 -     Lisinopril; Take 1 tablet (40 mg total) by mouth daily.  Dispense: 90 tablet; Refill: 0 -     hydroCHLOROthiazide; Take 1 tablet (25 mg total) by mouth daily.  Dispense: 90 tablet; Refill: 0 -     Rosuvastatin Calcium; Take 1 tablet (20 mg total) by mouth at bedtime.  Dispense: 90 tablet; Refill: 0  Return in about  5 months (around 06/21/2023) for CPE with fasting labs 1 week prior.  Spent 32 minutes on this patient encounter, including preparation, chart review, face-to-face counseling with patient and coordination of care, and documentation of encounter   ___________________________________________ Eric Favaro de [place], MD, ABFM, [facility] Primary Care and Sports Medicine [facility]

## 2023-01-25 NOTE — Assessment & Plan Note (Signed)
Patient with elevated BMI, greater than 30, indicating obesity.  He does have questions and concerns regarding management.  After long discussion, patient elected to proceed with referral to healthy weight and wellness clinic for consideration of following with them in regards to treatment management, referral placed today

## 2023-01-25 NOTE — Assessment & Plan Note (Signed)
Patient had labs completed earlier this year through cardiology which showed elevated serum creatinine.  Creatinine was found to be 1.47 in April 2024 with prior labs indicating baseline near 1.1.  He did have follow-up labs a few weeks later and creatinine had returned close to baseline at 1.14.  With elevation at 1.47, corresponding EGFR was found to be 67.  He has questions and concerns today as he was told by his cardiologist that labs in early April indicated stage III CKD.  He has had a lot of concern related to this information. Long discussion with patient today related to labs, subsequent follow-up labs which showed creatinine returning to baseline.  Discussed that the one-time elevation observed with labs in early April would not necessarily indicate underlying CKD at this time, particularly not at stage III level.  Discussed that there can be some variability with creatinine level and that when this does increase intermittently, this can indicate a short-term/acute issue that can result in the increase and subsequent return to baseline.  We discussed various things that can affect this such as dehydration or acute illness.  Currently, history and labs would not suggest present CKD, particularly at stage III severity.  Discussed this with patient.  Did review general considerations related to controlling for any possible risk factors such as ensuring adequate hydration, moderation of medications which can affect kidney function such as NSAIDs, maintaining appropriate control of blood pressure.

## 2023-01-25 NOTE — Assessment & Plan Note (Signed)
Blood pressure borderline in office today, did improve on recheck.  No issues with chest pain, headaches or dizziness.  Patient continues with hydrochlorothiazide and lisinopril given good control of blood pressure, can continue with current medication regimen, no changes made today Recommend intermittent monitoring of blood pressure, DASH diet

## 2023-03-15 DIAGNOSIS — L570 Actinic keratosis: Secondary | ICD-10-CM | POA: Diagnosis not present

## 2023-03-15 DIAGNOSIS — M129 Arthropathy, unspecified: Secondary | ICD-10-CM | POA: Diagnosis not present

## 2023-03-15 DIAGNOSIS — L578 Other skin changes due to chronic exposure to nonionizing radiation: Secondary | ICD-10-CM | POA: Diagnosis not present

## 2023-03-15 DIAGNOSIS — L814 Other melanin hyperpigmentation: Secondary | ICD-10-CM | POA: Diagnosis not present

## 2023-04-11 ENCOUNTER — Other Ambulatory Visit (INDEPENDENT_AMBULATORY_CARE_PROVIDER_SITE_OTHER): Payer: Self-pay

## 2023-04-11 ENCOUNTER — Ambulatory Visit: Payer: Medicare Other | Admitting: Physician Assistant

## 2023-04-11 DIAGNOSIS — M25552 Pain in left hip: Secondary | ICD-10-CM

## 2023-04-11 DIAGNOSIS — Z96642 Presence of left artificial hip joint: Secondary | ICD-10-CM

## 2023-04-11 DIAGNOSIS — M25562 Pain in left knee: Secondary | ICD-10-CM

## 2023-04-11 NOTE — Progress Notes (Unsigned)
 Office Visit Note   Patient: Eric Delgado           Date of Birth: November 23, 1955           MRN: [id number] Visit Date: 04/11/2023              Requested by: de Peru, Raymond J, MD [address] [place],  [place] [address] PCP: de Peru, Raymond J, MD   Assessment & Plan: Visit Diagnoses: No diagnosis found.  Plan: ***  Follow-Up Instructions: No follow-ups on file.   Orders:  No orders of the defined types were placed in this encounter.  No orders of the defined types were placed in this encounter.     Procedures: No procedures performed   Clinical Data: No additional findings.   Subjective: Chief Complaint  Patient presents with  . Right Knee - Pain  . Left Hip - Pain    12/21/13 LT THA    HPI  Review of Systems   Objective: Vital Signs: There were no vitals taken for this visit.  Physical Exam  Ortho Exam  Specialty Comments:  No specialty comments available.  Imaging: No results found.   PMFS History: Patient Active Problem List   Diagnosis Date Noted  . Diverticular disease of colon 01/21/2023  . Elevated serum creatinine 01/21/2023  . Right knee pain 09/14/2022  . Encounter for Medicare annual wellness exam 08/13/2022  . Vaping nicotine dependence, tobacco product 07/30/2022  . Pre-diabetes 06/01/2022  . Wellness examination 05/31/2022  . Exertional chest pain 05/07/2022  . Alcohol use disorder 11/20/2021  . Flexor tenosynovitis of finger 07/16/2021  . Malignant melanoma in situ (HCC) 06/23/2021  . Arthritis of hand 06/01/2021  . Pain in right hand 04/13/2021  . Left ear hearing loss 08/05/2020  . Ventral hernia 08/05/2020  . Status post left knee replacement 05/20/2020  . Unilateral primary osteoarthritis, left knee 04/07/2020  . Obesity, Class I, BMI 30-34.9 07/31/2016  . Chronic insomnia 07/31/2016  . Osteoarthritis, multiple sites 12/21/2013  . Status post hip replacement, bilateral 12/21/2013  . Encounter for general  adult medical examination with abnormal findings 10/16/2010  . Ex-smoker 10/16/2010  . HLD (hyperlipidemia) 12/17/2006  . Irritability 12/17/2006  . Essential hypertension 12/17/2006  . Carpal tunnel syndrome 12/17/2006   Past Medical History:  Diagnosis Date  . Difficulty sleeping    takes xanax for sleep  . History of hiatal hernia   . Hyperlipidemia   . Hypertension   . Osteoarthritis, multiple sites 12/21/2013  . SCIATICA, RIGHT 10/12/2007   Qualifier: Diagnosis of  By: Debby Bud MD, Rosalyn Gess     Family History  Problem Relation Age of Onset  . Macular degeneration Mother   . Hypertension Mother   . Hyperlipidemia Mother   . Cancer Mother 15       lung (small cell), smoker   . Stroke Maternal Aunt   . Diabetes Neg Hx   . CAD Neg Hx     Past Surgical History:  Procedure Laterality Date  . APPENDECTOMY  1975  . CATARACT EXTRACTION Bilateral 02/2015   [facility]  . COLONOSCOPY  10/2010   WNL, rpt 10 yrs ([facility]  . COLONOSCOPY  04/2020   TA, SSP, diverticulosis, rpt 5 yrs ([facility])  . ROTATOR CUFF REPAIR     left  . TOTAL HIP ARTHROPLASTY Right Spring '12   Dr. Charlann Boxer - ceramic ball, fiber stem  . TOTAL HIP ARTHROPLASTY Left 12/21/2013  Procedure: LEFT TOTAL HIP ARTHROPLASTY ANTERIOR APPROACH;  Surgeon: Kathryne Hitch, MD;  Location: [facility];  Service: Orthopedics;  Laterality: Left;  . TOTAL KNEE ARTHROPLASTY Left 05/20/2020   Procedure: LEFT TOTAL KNEE ARTHROPLASTY;  Surgeon: Kathryne Hitch, MD;  Location: [facility] OR;  Service: Orthopedics;  Laterality: Left;  Marland Kitchen VASECTOMY     Social History   Occupational History  . Not on file  Tobacco Use  . Smoking status: Former    Current packs/day: 0.00    Average packs/day: 0.5 packs/day for 30.0 years (15.0 ttl pk-yrs)    Types: Cigarettes    Start date: 01/16/1980    Quit date: 01/15/2010    Years since quitting: 13.2  . Smokeless tobacco: Never  Vaping Use  .  Vaping status: Some Days  Substance and Sexual Activity  . Alcohol use: Yes    Comment: 3 beers and 1 mixed drink each night  . Drug use: No  . Sexual activity: Yes    Partners: Female

## 2023-04-12 DIAGNOSIS — M25562 Pain in left knee: Secondary | ICD-10-CM | POA: Diagnosis not present

## 2023-04-12 MED ORDER — LIDOCAINE HCL 1 % IJ SOLN
3.0000 mL | INTRAMUSCULAR | Status: AC | PRN
  Administered 2023-04-12: 3 mL

## 2023-04-12 MED ORDER — LIDOCAINE HCL 2 % IJ SOLN
4.0000 mg | INTRAMUSCULAR | Status: AC | PRN
  Administered 2023-04-12: 4 mg

## 2023-04-14 DIAGNOSIS — M79652 Pain in left thigh: Secondary | ICD-10-CM | POA: Diagnosis not present

## 2023-04-14 DIAGNOSIS — M6200 Separation of muscle (nontraumatic), unspecified site: Secondary | ICD-10-CM | POA: Diagnosis not present

## 2023-04-14 DIAGNOSIS — K429 Umbilical hernia without obstruction or gangrene: Secondary | ICD-10-CM | POA: Diagnosis not present

## 2023-04-14 DIAGNOSIS — R1032 Left lower quadrant pain: Secondary | ICD-10-CM | POA: Diagnosis not present

## 2023-04-22 DIAGNOSIS — M2560 Stiffness of unspecified joint, not elsewhere classified: Secondary | ICD-10-CM | POA: Diagnosis not present

## 2023-04-22 DIAGNOSIS — M79641 Pain in right hand: Secondary | ICD-10-CM | POA: Diagnosis not present

## 2023-04-22 DIAGNOSIS — M2540 Effusion, unspecified joint: Secondary | ICD-10-CM | POA: Diagnosis not present

## 2023-04-22 DIAGNOSIS — M79642 Pain in left hand: Secondary | ICD-10-CM | POA: Diagnosis not present

## 2023-05-03 ENCOUNTER — Ambulatory Visit (INDEPENDENT_AMBULATORY_CARE_PROVIDER_SITE_OTHER): Payer: Medicare Other | Admitting: Physician Assistant

## 2023-05-03 ENCOUNTER — Encounter (INDEPENDENT_AMBULATORY_CARE_PROVIDER_SITE_OTHER): Payer: Self-pay | Admitting: Physician Assistant

## 2023-05-03 VITALS — BP 136/67 | HR 78 | Temp 98.3°F | Ht 68.0 in | Wt 224.0 lb

## 2023-05-03 DIAGNOSIS — Z96643 Presence of artificial hip joint, bilateral: Secondary | ICD-10-CM

## 2023-05-03 DIAGNOSIS — F17290 Nicotine dependence, other tobacco product, uncomplicated: Secondary | ICD-10-CM | POA: Diagnosis not present

## 2023-05-03 DIAGNOSIS — E785 Hyperlipidemia, unspecified: Secondary | ICD-10-CM

## 2023-05-03 DIAGNOSIS — Z96652 Presence of left artificial knee joint: Secondary | ICD-10-CM

## 2023-05-03 DIAGNOSIS — I10 Essential (primary) hypertension: Secondary | ICD-10-CM | POA: Diagnosis not present

## 2023-05-03 DIAGNOSIS — F1090 Alcohol use, unspecified, uncomplicated: Secondary | ICD-10-CM

## 2023-05-03 DIAGNOSIS — N1831 Chronic kidney disease, stage 3a: Secondary | ICD-10-CM | POA: Diagnosis not present

## 2023-05-03 DIAGNOSIS — R7303 Prediabetes: Secondary | ICD-10-CM | POA: Diagnosis not present

## 2023-05-03 DIAGNOSIS — E6609 Other obesity due to excess calories: Secondary | ICD-10-CM

## 2023-05-03 DIAGNOSIS — E66811 Obesity, class 1: Secondary | ICD-10-CM

## 2023-05-03 DIAGNOSIS — F5104 Psychophysiologic insomnia: Secondary | ICD-10-CM

## 2023-05-03 DIAGNOSIS — Z0289 Encounter for other administrative examinations: Secondary | ICD-10-CM

## 2023-05-03 DIAGNOSIS — Z6834 Body mass index (BMI) 34.0-34.9, adult: Secondary | ICD-10-CM

## 2023-05-03 DIAGNOSIS — M150 Primary generalized (osteo)arthritis: Secondary | ICD-10-CM

## 2023-05-03 NOTE — Progress Notes (Signed)
 Office: [phone]  /  Fax: [phone]   Initial Visit  Eric Delgado was seen in clinic today to evaluate for obesity. He is interested in losing weight to improve overall health and reduce the risk of weight related complications. He presents today to review program treatment options, initial physical assessment, and evaluation.     He was referred by: PCP  When asked what else they would like to accomplish? He states: Adopt healthier eating patterns, Improve energy levels and physical activity, Improve existing medical conditions, Reduce risk for a surgery, Improve quality of life, Improve appearance, Improve self-confidence, and Lose a target amount of weight : 180 lbs in 6-12 months.  When asked how has your weight affected you? He states: Has affected self-esteem, Relationships, Contributed to medical problems, Contributed to orthopedic problems or mobility issues, Having fatigue, Having poor endurance, and Problems with eating patterns  Weight history: Gained weight in 50's and more in 60's . Was 180 lbs most of adult life.   Some associated conditions: Hypertension, Arthritis:multiple joints, Hyperlipidemia, OSA, Prediabetes, Kidney disease, and Vitamin D Deficiency  Contributing factors: Family history of obesity, Disruption of circadian rhythm / sleep disordered breathing, Consumption of processed foods, Reduced physical activity, Eating patterns, and Slow metabolism for age  Weight promoting medications identified: None  Current nutrition plan: None and Other: watches what he eats due to Stage 3a CKD  Current level of physical activity: NEAT  Current or previous pharmacotherapy: None  Response to medication: Never tried medications   Past medical history includes:   Past Medical History:  Diagnosis Date   Difficulty sleeping    takes xanax for sleep   History of hiatal hernia    Hyperlipidemia    Hypertension    Osteoarthritis, multiple sites 12/21/2013    SCIATICA, RIGHT 10/12/2007   Qualifier: Diagnosis of  By: Debby Bud MD, Rosalyn Gess      Objective:   BP 136/67   Pulse 78   Temp 98.3 F (36.8 C)   Ht 5\' 8"  (1.727 m)   Wt 224 lb (101.6 kg)   SpO2 95%   BMI 34.06 kg/m  He was weighed on the bioimpedance scale: Body mass index is 34.06 kg/m.  Peak Weight:229 lbs , Body Fat%:30.7%, Visceral Fat Rating:19, Weight trend over the last 12 months: Unchanged  General:  Alert, oriented and cooperative. Patient is in no acute distress.  Respiratory: Normal respiratory effort, no problems with respiration noted   Gait: able to ambulate independently  Mental Status: Normal mood and affect. Normal behavior. Normal judgment and thought content.   DIAGNOSTIC DATA REVIEWED:  BMET    Component Value Date/Time   NA 135 06/14/2022 1445   K 4.2 06/14/2022 1445   CL 98 06/14/2022 1445   CO2 20 06/14/2022 1445   GLUCOSE 133 (H) 06/14/2022 1445   GLUCOSE 107 (H) 04/23/2021 0804   BUN 23 06/14/2022 1445   CREATININE 1.14 06/14/2022 1445   CALCIUM 10.0 06/14/2022 1445   GFRNONAA >60 05/21/2020 0612   GFRAA >90 12/22/2013 0544   Lab Results  Component Value Date   HGBA1C 5.7 (H) 05/17/2022   No results found for: "INSULIN" CBC    Component Value Date/Time   WBC 6.5 05/17/2022 1004   WBC 13.4 (H) 05/21/2020 0612   RBC 4.39 05/17/2022 1004   RBC 3.90 (L) 05/21/2020 0612   HGB 12.8 (L) 05/17/2022 1004   HCT 39.1 05/17/2022 1004   PLT 225 05/17/2022 1004   MCV 89  05/17/2022 1004   MCH 29.2 05/17/2022 1004   MCH 30.3 05/21/2020 0612   MCHC 32.7 05/17/2022 1004   MCHC 34.9 05/21/2020 0612   RDW 13.2 05/17/2022 1004   Iron/TIBC/Ferritin/ %Sat No results found for: "IRON", "TIBC", "FERRITIN", "IRONPCTSAT" Lipid Panel     Component Value Date/Time   CHOL 198 05/17/2022 1004   TRIG 113 05/17/2022 1004   HDL 61 05/17/2022 1004   CHOLHDL 3.2 05/17/2022 1004   CHOLHDL 3 04/23/2021 0804   VLDL 27.0 04/23/2021 0804   LDLCALC 117 (H)  05/17/2022 1004   LDLDIRECT 173.0 03/21/2015 0904   Hepatic Function Panel     Component Value Date/Time   PROT 6.9 06/14/2022 1445   ALBUMIN 4.6 06/14/2022 1445   AST 25 06/14/2022 1445   ALT 22 06/14/2022 1445   ALKPHOS 46 06/14/2022 1445   BILITOT 0.4 06/14/2022 1445   BILIDIR 0.0 09/15/2015 1542      Component Value Date/Time   TSH 3.040 06/14/2022 1445   TSH 3.88 07/21/2020 0755     Assessment and Plan:   Essential hypertension  Pre-diabetes  Vaping nicotine dependence, tobacco product  Stage 3a chronic kidney disease (HCC)  Alcohol use disorder  Hyperlipidemia, unspecified hyperlipidemia type  Status post hip replacement, bilateral  Primary osteoarthritis involving multiple joints  Chronic insomnia  Status post left knee replacement  Class 1 obesity due to excess calories with serious comorbidity and body mass index (BMI) of 34.0 to 34.9 in adult   Current BMI 34.1      Obesity Treatment / Action Plan:  Patient will work on garnering support from family and friends to begin weight loss journey. Will work on eliminating or reducing the presence of highly palatable, calorie dense foods in the home. Will complete provided nutritional and psychosocial assessment questionnaire before the next appointment. Will be scheduled for indirect calorimetry to determine resting energy expenditure in a fasting state.  This will allow [place] to create a reduced calorie, high-protein meal plan to promote loss of fat mass while preserving muscle mass. Will work on reducing intake of added sugars, simple sugars and processed carbs. Will avoid skipping meals which may result in increased hunger signals and overeating at certain times. Counseled on the health benefits of losing 5%-15% of total body weight. Will work on improving sleep hygiene and trying to obtain at least 7 hours of sleep. Was counseled on nutritional approaches to weight loss and benefits of reducing processed  foods and consuming plant-based foods and high quality protein as part of nutritional weight management. Was counseled on pharmacotherapy and role as an adjunct in weight management.   Obesity Education Performed Today:  He was weighed on the bioimpedance scale and results were discussed and documented in the synopsis.  We discussed obesity as a disease and the importance of a more detailed evaluation of all the factors contributing to the disease.  We discussed the importance of long term lifestyle changes which include nutrition, exercise and behavioral modifications as well as the importance of customizing this to his specific health and social needs.  We discussed the benefits of reaching a healthier weight to alleviate the symptoms of existing conditions and reduce the risks of the biomechanical, metabolic and psychological effects of obesity.  Marry Guan appears to be in the action stage of change and states they are ready to start intensive lifestyle modifications and behavioral modifications.  30 minutes was spent today on this visit including the above counseling, pre-visit chart  review, and post-visit documentation.  Reviewed by clinician on day of visit: allergies, medications, problem list, medical history, surgical history, family history, social history, and previous encounter notes pertinent to obesity diagnosis.   Auri Jahnke,PA-C

## 2023-05-24 ENCOUNTER — Encounter (INDEPENDENT_AMBULATORY_CARE_PROVIDER_SITE_OTHER): Payer: Self-pay

## 2023-06-02 ENCOUNTER — Ambulatory Visit (INDEPENDENT_AMBULATORY_CARE_PROVIDER_SITE_OTHER): Admitting: Family Medicine

## 2023-06-12 ENCOUNTER — Other Ambulatory Visit (HOSPITAL_BASED_OUTPATIENT_CLINIC_OR_DEPARTMENT_OTHER): Payer: Self-pay | Admitting: Family Medicine

## 2023-06-16 ENCOUNTER — Ambulatory Visit (INDEPENDENT_AMBULATORY_CARE_PROVIDER_SITE_OTHER): Admitting: Family Medicine

## 2023-06-16 ENCOUNTER — Encounter (INDEPENDENT_AMBULATORY_CARE_PROVIDER_SITE_OTHER): Payer: Self-pay | Admitting: Family Medicine

## 2023-06-16 VITALS — BP 132/81 | HR 78 | Temp 98.1°F | Ht 68.0 in | Wt 217.0 lb

## 2023-06-16 DIAGNOSIS — R0602 Shortness of breath: Secondary | ICD-10-CM | POA: Diagnosis not present

## 2023-06-16 DIAGNOSIS — Z1331 Encounter for screening for depression: Secondary | ICD-10-CM | POA: Diagnosis not present

## 2023-06-16 DIAGNOSIS — R5383 Other fatigue: Secondary | ICD-10-CM

## 2023-06-16 DIAGNOSIS — E669 Obesity, unspecified: Secondary | ICD-10-CM

## 2023-06-16 DIAGNOSIS — R7303 Prediabetes: Secondary | ICD-10-CM | POA: Diagnosis not present

## 2023-06-16 DIAGNOSIS — E785 Hyperlipidemia, unspecified: Secondary | ICD-10-CM

## 2023-06-16 DIAGNOSIS — Z6833 Body mass index (BMI) 33.0-33.9, adult: Secondary | ICD-10-CM

## 2023-06-16 DIAGNOSIS — R0609 Other forms of dyspnea: Secondary | ICD-10-CM | POA: Insufficient documentation

## 2023-06-16 DIAGNOSIS — E7849 Other hyperlipidemia: Secondary | ICD-10-CM

## 2023-06-16 DIAGNOSIS — I10 Essential (primary) hypertension: Secondary | ICD-10-CM | POA: Diagnosis not present

## 2023-06-16 NOTE — Progress Notes (Signed)
 Office: [phone]  /  Fax: [phone]  WEIGHT SUMMARY AND BIOMETRICS  Anthropometric Measurements Height: 5\' 8"  (1.727 m) Weight: 217 lb (98.4 kg) BMI (Calculated): 33 Weight at Last Visit: NA Weight Lost Since Last Visit: NA Weight Gained Since Last Visit: NA Starting Weight: 217lb Total Weight Loss (lbs):  (0 kg) (NA) Peak Weight: 229lb Waist Measurement : 48 inches   Body Composition  Body Fat %: 30.9 % Fat Mass (lbs): 67.2 lbs Muscle Mass (lbs): 142.8 lbs Total Body Water (lbs): 103.4 lbs Visceral Fat Rating : 18   Other Clinical Data RMR: 2506 Fasting: yes Labs: Yes Today's Visit #: 1 Starting Date: 06/16/23 Comments: New Patient    Chief Complaint: OBESITY    History of Present Illness   Eric Delgado "Baker Bon" is a 68 year old male with obesity who presents for evaluation and management of his condition.   He has comorbidities associated with his obesity, including worsening fatigue and shortness of breath with exercise, both of which have intensified with weight gain. He also has hypertension, hyperlipidemia, and pre-diabetes.   He is currently managing his hypertension, hyperlipidemia, and pre-diabetes, although specific medications and dosages were not discussed.  He is responsible for cooking at home and drinks six to nine bottles of water a day. He uses monk fruit sugar in small amounts.   I reviewed the patient's new patient paperwork and questionnaire on eating history and habits, noting the following pertinent positives:  He lives with his wife, who he feels will be supportive of his weight loss efforts.  He would like to reach a weight goal of 180 pounds, which was recommended by his primary care physician.  He started gaining excessive weight approximately 5 years ago and is currently close to his heaviest weight of 228 pounds.  He has not engaged in any formal weight loss efforts previously.  The patient denies food cravings and food  dislikes, and he does most of the shopping and cooking.  He occasionally snacks at night, especially on chips, but does not wake up hungry in the middle of the night.  He consumes regular sodas and tea with sugar, and states he has no bad food habits.  He denies experiencing excessive hunger but acknowledges having a problem with portion control.  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 Other than that, he denies other emotional eating behaviors.  He has no history of diagnosed eating disorders and does not currently believe he has any eating disorders currently.  His modified mood and food score is 10, which is mildly positive.  He has a positive Epworth Sleepiness Score of 18.  His basal metabolic rate was calculated to be 2007 calories per day.  His resting energy expenditure, measured via indirect calorimetry, was 2506 calories per day.  His ECG showed normal sinus rhythm at 75 beats per minute.  I reviewed the patient's new patient paperwork and questionnaire on eating history and habits, noting the following pertinent positives: He lives with his wife, who he feels will be supportive of his weight loss efforts. He would like to reach a weight goal of 180 pounds, which was recommended by his primary care physician. He started gaining excessive weight approximately 5 years ago and is currently close to his heaviest weight of 228 pounds. He has not engaged in any formal weight loss efforts previously. The  patient denies food cravings and food dislikes, and he does most of the shopping and cooking.  He occasionally snacks at night, especially on chips, but does not wake up hungry in the middle of the night. He consumes regular sodas and tea with sugar, and states he has no bad food habits. He denies experiencing excessive hunger but acknowledges having a problem with portion control. 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 Other than that, he denies other emotional eating behaviors. He has no history of diagnosed eating disorders and does not currently believe he has any eating disorders. His modified mood and food score is 10, which is mildly positive. He has a positive Epworth Sleepiness Score of 18. His basal metabolic rate was calculated to be 2007 calories per day. His resting energy expenditure, measured via indirect calorimetry, was 2506 calories per day. His ECG showed normal sinus rhythm at 75 beats per minute.          PHYSICAL EXAM:  Blood pressure 132/81, pulse 78, temperature 98.1 F (36.7 C), height 5\' 8"  (1.727 m), weight 217 lb (98.4 kg), SpO2 96%. Body mass index is 32.99 kg/m.  DIAGNOSTIC DATA REVIEWED:  BMET    Component Value Date/Time   NA 135 06/14/2022 1445   K 4.2 06/14/2022 1445   CL 98 06/14/2022 1445   CO2 20 06/14/2022 1445   GLUCOSE 133 (H) 06/14/2022 1445   GLUCOSE 107 (H) 04/23/2021 0804   BUN 23 06/14/2022 1445   CREATININE 1.14 06/14/2022 1445   CALCIUM  10.0 06/14/2022 1445   GFRNONAA >60 05/21/2020 0612   GFRAA >90 12/22/2013 0544   Lab Results  Component Value Date   HGBA1C 5.7 (H) 05/17/2022   No results found for: "INSULIN" Lab Results  Component Value Date   TSH 3.040 06/14/2022   CBC    Component Value Date/Time   WBC 6.5 05/17/2022 1004   WBC 13.4 (H) 05/21/2020 0612   RBC 4.39 05/17/2022 1004   RBC 3.90 (L) 05/21/2020 0612   HGB 12.8 (L) 05/17/2022 1004   HCT 39.1 05/17/2022 1004   PLT 225 05/17/2022 1004   MCV 89 05/17/2022 1004   MCH 29.2 05/17/2022 1004   MCH 30.3 05/21/2020 0612   MCHC 32.7 05/17/2022 1004   MCHC 34.9 05/21/2020 0612   RDW 13.2 05/17/2022 1004   Iron Studies No results found for: "IRON", "TIBC", "FERRITIN", "IRONPCTSAT" Lipid Panel     Component Value Date/Time   CHOL 198  05/17/2022 1004   TRIG 113 05/17/2022 1004   HDL 61 05/17/2022 1004   CHOLHDL 3.2 05/17/2022 1004   CHOLHDL 3 04/23/2021 0804   VLDL 27.0 04/23/2021 0804   LDLCALC 117 (H) 05/17/2022 1004   LDLDIRECT 173.0 03/21/2015 0904   Hepatic Function Panel     Component Value Date/Time   PROT 6.9 06/14/2022 1445   ALBUMIN 4.6 06/14/2022 1445   AST 25 06/14/2022 1445   ALT 22 06/14/2022 1445   ALKPHOS 46 06/14/2022 1445   BILITOT 0.4 06/14/2022 1445   BILIDIR 0.0 09/15/2015 1542      Component Value Date/Time   TSH 3.040 06/14/2022 1445   TSH 3.88 07/21/2020 0755   Nutritional No results found for: "VD25OH"   Assessment and Plan    Obesity Obesity with comorbidities including fatigue, exertional dyspnea, hypertension, hyperlipidemia, and prediabetes. Initiating a category four eating plan  to aid in weight loss, incorporating regular foods with specific meal and snack options to prevent metabolic slowdown and muscle mass loss. Emphasis on consuming all prescribed food as 'medicine' to ensure effectiveness. No exercise assigned for the first two weeks to focus on dietary changes. Resting metabolic rate assessed to tailor caloric intake for weight loss without reducing metabolism. - Start category four eating plan. - Provide grocery list and food scale. - Provide insulated lunch bag for meal transport. - Order blood work to evaluate obesity-related conditions. - Follow up in two weeks to assess progress and review test results.  Fatigue Fatigue is a comorbidity of obesity, likely exacerbated by weight gain. Addressing through dietary changes aimed at weight loss. - Start category four eating plan. - Order blood work to evaluate obesity-related conditions.  Shortness of breath with exercise Exertional dyspnea is a comorbidity of obesity, likely exacerbated by weight gain. Addressing through dietary changes aimed at weight loss. - Start category four eating plan. - Order blood work to  evaluate obesity-related conditions.  Hypertension Blood pressure is 132/81 mmHg, indicating well-controlled hypertension. - Start category four eating plan. - Order blood work to evaluate obesity-related conditions.  Hyperlipidemia Hyperlipidemia is a comorbidity of obesity. - Start category four eating plan. - Order blood work to evaluate obesity-related conditions.  Prediabetes Prediabetes is a comorbidity of obesity. - Start category four eating plan. - Order blood work to evaluate obesity-related conditions.         I have personally spent 62 minutes total time today in preparation, patient care, and documentation for this visit, including the following: review of clinical lab tests; review of medical history, nutritional education and discussions of lifestyle modifications needed to improve his health   He was informed of the importance of frequent follow up visits to maximize his success with intensive lifestyle modifications for his multiple health conditions.    Jasmine Mesi, MD

## 2023-06-17 ENCOUNTER — Other Ambulatory Visit (HOSPITAL_BASED_OUTPATIENT_CLINIC_OR_DEPARTMENT_OTHER): Payer: Medicare HMO

## 2023-06-17 LAB — LIPID PANEL WITH LDL/HDL RATIO
Cholesterol, Total: 194 mg/dL (ref 100–199)
HDL: 59 mg/dL (ref >39)
LDL Chol Calc (NIH): 113 mg/dL — ABNORMAL HIGH (ref 0–99)
LDL/HDL Ratio: 1.9 ratio (ref 0.0–3.6)
Triglycerides: 127 mg/dL (ref 0–149)
VLDL Cholesterol Cal: 22 mg/dL (ref 5–40)

## 2023-06-17 LAB — CMP14+EGFR
ALT: 23 IU/L (ref 0–44)
AST: 23 IU/L (ref 0–40)
Albumin: 4.6 g/dL (ref 3.9–4.9)
Alkaline Phosphatase: 54 IU/L (ref 44–121)
BUN/Creatinine Ratio: 24 (ref 10–24)
BUN: 23 mg/dL (ref 8–27)
Bilirubin Total: 0.5 mg/dL (ref 0.0–1.2)
CO2: 21 mmol/L (ref 20–29)
Calcium: 9.6 mg/dL (ref 8.6–10.2)
Chloride: 97 mmol/L (ref 96–106)
Creatinine, Ser: 0.96 mg/dL (ref 0.76–1.27)
Globulin, Total: 2.5 g/dL (ref 1.5–4.5)
Glucose: 102 mg/dL — ABNORMAL HIGH (ref 70–99)
Potassium: 4.6 mmol/L (ref 3.5–5.2)
Sodium: 131 mmol/L — ABNORMAL LOW (ref 134–144)
Total Protein: 7.1 g/dL (ref 6.0–8.5)
eGFR: 87 mL/min/{1.73_m2} (ref >59)

## 2023-06-17 LAB — T4, FREE: Free T4: 1 ng/dL (ref 0.82–1.77)

## 2023-06-17 LAB — HEMOGLOBIN A1C
Est. average glucose Bld gHb Est-mCnc: 114 mg/dL
Hgb A1c MFr Bld: 5.6 % (ref 4.8–5.6)

## 2023-06-17 LAB — T3: T3, Total: 157 ng/dL (ref 71–180)

## 2023-06-17 LAB — CBC WITH DIFFERENTIAL/PLATELET
Basophils Absolute: 0 10*3/uL (ref 0.0–0.2)
Basos: 1 %
EOS (ABSOLUTE): 0.2 10*3/uL (ref 0.0–0.4)
Eos: 3 %
Hematocrit: 39 % (ref 37.5–51.0)
Hemoglobin: 13.3 g/dL (ref 13.0–17.7)
Immature Grans (Abs): 0 10*3/uL (ref 0.0–0.1)
Immature Granulocytes: 0 %
Lymphocytes Absolute: 0.9 10*3/uL (ref 0.7–3.1)
Lymphs: 11 %
MCH: 29.6 pg (ref 26.6–33.0)
MCHC: 34.1 g/dL (ref 31.5–35.7)
MCV: 87 fL (ref 79–97)
Monocytes Absolute: 0.9 10*3/uL (ref 0.1–0.9)
Monocytes: 11 %
Neutrophils Absolute: 6.1 10*3/uL (ref 1.4–7.0)
Neutrophils: 74 %
Platelets: 230 10*3/uL (ref 150–450)
RBC: 4.49 x10E6/uL (ref 4.14–5.80)
RDW: 13.7 % (ref 11.6–15.4)
WBC: 8.1 10*3/uL (ref 3.4–10.8)

## 2023-06-17 LAB — TSH: TSH: 2.63 u[IU]/mL (ref 0.450–4.500)

## 2023-06-17 LAB — VITAMIN D 25 HYDROXY (VIT D DEFICIENCY, FRACTURES): Vit D, 25-Hydroxy: 45.7 ng/mL (ref 30.0–100.0)

## 2023-06-17 LAB — VITAMIN B12: Vitamin B-12: 668 pg/mL (ref 232–1245)

## 2023-06-17 LAB — INSULIN, RANDOM: INSULIN: 14.8 u[IU]/mL (ref 2.6–24.9)

## 2023-06-17 LAB — MAGNESIUM: Magnesium: 2 mg/dL (ref 1.6–2.3)

## 2023-06-17 LAB — FOLATE: Folate: 12.8 ng/mL (ref >3.0)

## 2023-06-24 ENCOUNTER — Ambulatory Visit (INDEPENDENT_AMBULATORY_CARE_PROVIDER_SITE_OTHER): Admitting: Family Medicine

## 2023-06-24 ENCOUNTER — Encounter (HOSPITAL_BASED_OUTPATIENT_CLINIC_OR_DEPARTMENT_OTHER): Payer: Medicare HMO | Admitting: Family Medicine

## 2023-06-24 ENCOUNTER — Ambulatory Visit (INDEPENDENT_AMBULATORY_CARE_PROVIDER_SITE_OTHER)

## 2023-06-24 ENCOUNTER — Encounter (HOSPITAL_BASED_OUTPATIENT_CLINIC_OR_DEPARTMENT_OTHER): Payer: Self-pay | Admitting: Family Medicine

## 2023-06-24 VITALS — BP 124/73 | HR 75 | Ht 68.0 in | Wt 218.5 lb

## 2023-06-24 DIAGNOSIS — R7989 Other specified abnormal findings of blood chemistry: Secondary | ICD-10-CM

## 2023-06-24 DIAGNOSIS — I10 Essential (primary) hypertension: Secondary | ICD-10-CM | POA: Diagnosis not present

## 2023-06-24 DIAGNOSIS — M25552 Pain in left hip: Secondary | ICD-10-CM

## 2023-06-24 DIAGNOSIS — M25559 Pain in unspecified hip: Secondary | ICD-10-CM | POA: Insufficient documentation

## 2023-06-24 DIAGNOSIS — M545 Low back pain, unspecified: Secondary | ICD-10-CM

## 2023-06-24 DIAGNOSIS — Z125 Encounter for screening for malignant neoplasm of prostate: Secondary | ICD-10-CM

## 2023-06-24 DIAGNOSIS — R7303 Prediabetes: Secondary | ICD-10-CM | POA: Diagnosis not present

## 2023-06-24 DIAGNOSIS — M438X6 Other specified deforming dorsopathies, lumbar region: Secondary | ICD-10-CM | POA: Diagnosis not present

## 2023-06-24 DIAGNOSIS — G8929 Other chronic pain: Secondary | ICD-10-CM

## 2023-06-24 DIAGNOSIS — M47816 Spondylosis without myelopathy or radiculopathy, lumbar region: Secondary | ICD-10-CM | POA: Diagnosis not present

## 2023-06-24 DIAGNOSIS — M2540 Effusion, unspecified joint: Secondary | ICD-10-CM | POA: Insufficient documentation

## 2023-06-24 DIAGNOSIS — M2560 Stiffness of unspecified joint, not elsewhere classified: Secondary | ICD-10-CM | POA: Insufficient documentation

## 2023-06-24 NOTE — Assessment & Plan Note (Signed)
 Most recent labs with hemoglobin A1c of 5.6%.  This is slightly improved from prior labs.  He continues with lifestyle modifications.  Recommend continuing with modifications and working towards gradual weight loss

## 2023-06-24 NOTE — Patient Instructions (Signed)
  Medication Instructions:  Your physician recommends that you continue on your current medications as directed. Please refer to the Current Medication list given to you today. --If you need a refill on any your medications before your next appointment, please call your pharmacy first. If no refills are authorized on file call the office.-- Lab Work: Your physician has recommended that you have lab work today: PSA If you have labs (blood work) drawn today and your tests are completely normal, you will receive your results via MyChart message OR a phone call from our staff.  Please ensure you check your voicemail in the event that you authorized detailed messages to be left on a delegated number. If you have any lab test that is abnormal or we need to change your treatment, we will call you to review the results.  Follow-Up: Your next appointment:   Your physician recommends that you schedule a follow-up appointment in: 4-6 month follow up  with Dr. de [place]  You will receive a text message or e-mail with a link to a survey about your care and experience with us  today! We would greatly appreciate your feedback!   Thanks for letting us  be apart of your health journey!!  Primary Care and Sports Medicine   Dr. Court Distance Peru   We encourage you to activate your patient portal called "MyChart".  Sign up information is provided on this After Visit Summary.  MyChart is used to connect with patients for Virtual Visits (Telemedicine).  Patients are able to view lab/test results, encounter notes, upcoming appointments, etc.  Non-urgent messages can be sent to your provider as well. To learn more about what you can do with MyChart, please visit --  [email or web address].

## 2023-06-24 NOTE — Assessment & Plan Note (Signed)
 Blood pressure at goal in office today.  Continues with lisinopril  and hydrochlorothiazide , no issues with medication at this time.  Has had intermittent hyponatremia on labs.  He does admit to drinking up to 10 bottles of water per day. Discussed considerations.  Can continue with current medication regimen given good blood pressure control. Would consider reducing dose or discontinuing hydrochlorothiazide  and monitoring sodium response with this change.  Could also slightly decreasing free water intake. Recommend intermittent monitoring of blood pressure at home, DASH diet

## 2023-06-24 NOTE — Progress Notes (Signed)
    Procedures performed today:    None.  Independent interpretation of notes and tests performed by another provider:   None.  Brief History, Exam, Impression, and Recommendations:    BP 124/73 (BP Location: Right Arm, Patient Position: Sitting, Cuff Size: Normal)   Pulse 75   Ht 5\' 8"  (1.727 m)   Wt 218 lb 8 oz (99.1 kg)   SpO2 96%   BMI 33.22 kg/m   Essential hypertension Assessment & Plan: Blood pressure at goal in office today.  Continues with lisinopril  and hydrochlorothiazide , no issues with medication at this time.  Has had intermittent hyponatremia on labs.  He does admit to drinking up to 10 bottles of water per day. Discussed considerations.  Can continue with current medication regimen given good blood pressure control. Would consider reducing dose or discontinuing hydrochlorothiazide  and monitoring sodium response with this change.  Could also slightly decreasing free water intake. Recommend intermittent monitoring of blood pressure at home, DASH diet   Prostate cancer screening -     PSA Total (Reflex To Free)  Pre-diabetes Assessment & Plan: Most recent labs with hemoglobin A1c of 5.6%.  This is slightly improved from prior labs.  He continues with lifestyle modifications.  Recommend continuing with modifications and working towards gradual weight loss   Elevated serum creatinine Assessment & Plan: Noted on prior labs, however more recent labs have shown improvement in this and it is currently within normal range. Given progress, can monitor intermittently moving forward   Pain of left hip Assessment & Plan: Has been having left hip pain primarily along the medial aspect of hip/inner thigh.  He has discussed this with other providers as he thought he may have a hernia and also did discuss with orthopedist as well.  He was advised that he should talk with sports medicine for possible injection.  Thinks that pain started after overusing his leg.  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 On exam, no pain with passive range of motion.  Does have some pain with active straight leg raise.  Pain with resisted adduction.  Normal gait. Suspect issue is primarily related to sports hernia/adductor strain.  Discussed considerations related to this.  Would recommend working with physical therapy, however patient indicates that he would not have time to be able to meet with them.  Did provide patient with handout regarding diagnosis as well as home exercises that he can initially utilize.  If symptoms do persist, would recommend physical therapy.  Did review prior x-rays.  Most recent x-rays with normal-appearing bilateral hip prostheses.  There is some irregularity noted around the pubic symphysis which does appear to be somewhat changed compared to prior pelvis x-rays from several years ago.  If symptoms do persist, would consider advanced imaging for further evaluation and to further assess pubic symphysis   Chronic bilateral low back pain, unspecified whether sciatica present -     DG Lumbar Spine Complete; Future  Return in about 4 months (around 10/25/2023).   ___________________________________________ Nabria Nevin de [place], MD, ABFM, CAQSM Primary Care and Sports Medicine [facility]

## 2023-06-24 NOTE — Assessment & Plan Note (Signed)
 Has been having left hip pain primarily along the medial aspect of hip/inner thigh.  He has discussed this with other providers as he thought he may have a hernia and also did discuss with orthopedist as well.  He was advised that he should talk with sports medicine for possible injection.  Thinks that pain started after overusing his leg.  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 On exam, no pain with passive range of motion.  Does have some pain with active straight leg raise.  Pain with resisted adduction.  Normal gait. Suspect issue is primarily related to sports hernia/adductor strain.  Discussed considerations related to this.  Would recommend working with physical therapy, however patient indicates that he would not have time to be able to meet with them.  Did provide patient with handout regarding diagnosis as well as home exercises that he can initially utilize.  If symptoms do persist, would recommend physical therapy.  Did review prior x-rays.  Most recent x-rays with normal-appearing bilateral hip prostheses.  There is some irregularity noted around the pubic symphysis which does appear to be somewhat changed compared to prior pelvis x-rays from several years ago.  If symptoms do persist, would consider advanced imaging for further evaluation and to further assess pubic symphysis

## 2023-06-24 NOTE — Assessment & Plan Note (Signed)
 Noted on prior labs, however more recent labs have shown improvement in this and it is currently within normal range. Given progress, can monitor intermittently moving forward

## 2023-06-25 LAB — PSA TOTAL (REFLEX TO FREE): Prostate Specific Ag, Serum: 1.5 ng/mL (ref 0.0–4.0)

## 2023-06-30 ENCOUNTER — Ambulatory Visit (INDEPENDENT_AMBULATORY_CARE_PROVIDER_SITE_OTHER): Admitting: Family Medicine

## 2023-06-30 ENCOUNTER — Encounter (INDEPENDENT_AMBULATORY_CARE_PROVIDER_SITE_OTHER): Payer: Self-pay | Admitting: Family Medicine

## 2023-06-30 VITALS — BP 112/64 | HR 77 | Temp 98.1°F | Ht 68.0 in | Wt 210.0 lb

## 2023-06-30 DIAGNOSIS — Z6832 Body mass index (BMI) 32.0-32.9, adult: Secondary | ICD-10-CM

## 2023-06-30 DIAGNOSIS — E7849 Other hyperlipidemia: Secondary | ICD-10-CM

## 2023-06-30 DIAGNOSIS — R7303 Prediabetes: Secondary | ICD-10-CM

## 2023-06-30 DIAGNOSIS — E785 Hyperlipidemia, unspecified: Secondary | ICD-10-CM

## 2023-06-30 DIAGNOSIS — N1831 Chronic kidney disease, stage 3a: Secondary | ICD-10-CM | POA: Diagnosis not present

## 2023-06-30 DIAGNOSIS — E669 Obesity, unspecified: Secondary | ICD-10-CM | POA: Diagnosis not present

## 2023-06-30 DIAGNOSIS — E66811 Obesity, class 1: Secondary | ICD-10-CM

## 2023-06-30 DIAGNOSIS — Z6831 Body mass index (BMI) 31.0-31.9, adult: Secondary | ICD-10-CM

## 2023-06-30 MED ORDER — METFORMIN HCL 500 MG PO TABS: 500.0000 mg | ORAL_TABLET | Freq: Every day | ORAL | 0 refills | Status: DC

## 2023-06-30 NOTE — Progress Notes (Signed)
 Office: [phone]  /  Fax: [phone]  WEIGHT SUMMARY AND BIOMETRICS  Anthropometric Measurements Height: 5\' 8"  (1.727 m) Weight: 210 lb (95.3 kg) BMI (Calculated): 31.94 Weight at Last Visit: 217 lb Weight Lost Since Last Visit: 7 lb Weight Gained Since Last Visit: 0 Starting Weight: 217 lb Total Weight Loss (lbs): 7 lb (3.175 kg) Peak Weight: 229 lb   Body Composition  Body Fat %: 29.3 % Fat Mass (lbs): 61.8 lbs Muscle Mass (lbs): 141.4 lbs Total Body Water (lbs): 101.4 lbs Visceral Fat Rating : 17   Other Clinical Data Fasting: no Labs: no Today's Visit #: 2 Starting Date: 06/16/23    Chief Complaint: OBESITY    History of Present Illness Eric WALSINGHAM "Baker Bon" is a 68 year old male who presents for evaluation of obesity management and lab results.  He adheres to a category four eating plan 90% of the time, resulting in a weight loss of seven pounds. He misses certain foods like 'gravy biscuits' and 'chicken wings', indicating a sense of deprivation.  His glucose level has improved from 133 mg/dL last year to 295 mg/dL, with an A2Z reduction from 5.7% to 5.6%. He is still producing a significant amount of insulin , with fasting levels at 15 (below 5 WNL when fasting)  His hyperlipidemia shows improvement with LDL levels decreasing from 117 mg/dL to 308 mg/dL. Triglycerides have improved from 170 mg/dL to 657 mg/dL, and HDL is at a healthy 59 mg/dL.  His stage 3a CKD has shown significant improvement, with his GFR increasing from 53 to 87. He drinks seven to ten 20-ounce bottles of water daily, which has led to low sodium levels. He avoids potassium-rich foods and his potassium levels are stable.  He continues to take hydrochlorothiazide  and lisinopril  for hypertension and Crestor  for hyperlipidemia. He mentions a past orthopedic consultation regarding knee replacement and a history of arthritis, which is prevalent in his family.      PHYSICAL  EXAM:  Blood pressure 112/64, pulse 77, temperature 98.1 F (36.7 C), height 5\' 8"  (1.727 m), weight 210 lb (95.3 kg), SpO2 94%. Body mass index is 31.93 kg/m.  DIAGNOSTIC DATA REVIEWED:  BMET    Component Value Date/Time   NA 131 (L) 06/16/2023 0833   K 4.6 06/16/2023 0833   CL 97 06/16/2023 0833   CO2 21 06/16/2023 0833   GLUCOSE 102 (H) 06/16/2023 0833   GLUCOSE 107 (H) 04/23/2021 0804   BUN 23 06/16/2023 0833   CREATININE 0.96 06/16/2023 0833   CALCIUM  9.6 06/16/2023 0833   GFRNONAA >60 05/21/2020 0612   GFRAA >90 12/22/2013 0544   Lab Results  Component Value Date   HGBA1C 5.6 06/16/2023   HGBA1C 5.7 (H) 05/17/2022   Lab Results  Component Value Date   INSULIN  14.8 06/16/2023   Lab Results  Component Value Date   TSH 2.630 06/16/2023   CBC    Component Value Date/Time   WBC 8.1 06/16/2023 0833   WBC 13.4 (H) 05/21/2020 0612   RBC 4.49 06/16/2023 0833   RBC 3.90 (L) 05/21/2020 0612   HGB 13.3 06/16/2023 0833   HCT 39.0 06/16/2023 0833   PLT 230 06/16/2023 0833   MCV 87 06/16/2023 0833   MCH 29.6 06/16/2023 0833   MCH 30.3 05/21/2020 0612   MCHC 34.1 06/16/2023 0833   MCHC 34.9 05/21/2020 0612   RDW 13.7 06/16/2023 0833   Iron Studies No results found for: "IRON", "TIBC", "FERRITIN", "IRONPCTSAT" Lipid Panel  Component Value Date/Time   CHOL 194 06/16/2023 0833   TRIG 127 06/16/2023 0833   HDL 59 06/16/2023 0833   CHOLHDL 3.2 05/17/2022 1004   CHOLHDL 3 04/23/2021 0804   VLDL 27.0 04/23/2021 0804   LDLCALC 113 (H) 06/16/2023 0833   LDLDIRECT 173.0 03/21/2015 0904   Hepatic Function Panel     Component Value Date/Time   PROT 7.1 06/16/2023 0833   ALBUMIN 4.6 06/16/2023 0833   AST 23 06/16/2023 0833   ALT 23 06/16/2023 0833   ALKPHOS 54 06/16/2023 0833   BILITOT 0.5 06/16/2023 0833   BILIDIR 0.0 09/15/2015 1542      Component Value Date/Time   TSH 2.630 06/16/2023 0833   Nutritional Lab Results  Component Value Date   VD25OH  45.7 06/16/2023     Assessment and Plan Assessment & Plan Obesity Obesity management with a category four eating plan has resulted in a 7-pound weight loss over two weeks. He reports 90% adherence to the plan. Discussion of potential weight loss medication options, including metformin, which is not FDA approved for weight loss but has shown weight loss as a side effect. It works by lowering insulin  levels, reducing hunger, and controlling cravings. Side effects include queasiness if taken on an empty stomach and potential diarrhea with poor dietary choices. He is interested in trying this medication. - Start metformin 500 mg with the first meal of the day to aid in weight loss and improve insulin  levels as well as prevent DM. - Provide a handout on metformin and its side effects. - Discuss the importance of protein intake and set a daily calorie goal of 1500-1800 calories with at least 120 grams of protein.  Prediabetes Prediabetes is improving with a current glucose level of 102, down from 133 last year. A1c has decreased from 5.7 to 5.6, with a goal of 5.5. Insulin  levels are elevated at 15, indicating the pancreas is working hard to control blood sugar. The eating plan is contributing to improvements in insulin  control and weight loss. Metformin is prescribed to further aid in insulin  control and weight loss and decrease the risk of developing diabetes. - Continue the Cat 3 current eating plan to improve insulin  control. - Start metformin 500 mg with the first meal of the day to aid in insulin  control and weight loss.  -Will continue to monitor renal function and recheck labs in 3 months.  Hyperlipidemia Hyperlipidemia is improving. LDL has decreased from 117 to 113, with a goal of under 100. Triglycerides have improved from 170 to 127, and HDL is at 59, above the goal of 45 for men. The current eating plan is contributing to these improvements. - Continue Crestor  for hyperlipidemia  management. -Continue diet, exercise and weight loss to improve cholesterol  Hypertension Hypertension management is ongoing with current medications, suggesting well-controlled blood pressure. - Continue hydrochlorothiazide  and lisinopril  for hypertension management. - Continue weight loss efforts with the goal of controlling his BP with less medication.  Stage 3a CKD-improved Stage 3a CKD with previous GFR of 53 has improved to a GFR of 87, indicating normalization of kidney function. He has been hydrating well, but sodium levels are low, possibly due to excessive water intake. Consideration of sports drinks in the futureto stabilize sodium levels was discussed. - Consider incorporating zero-calorie sports drinks like Gatorade or Powerade to stabilize sodium levels if they remain low.     I have personally spent 44 minutes total time today in preparation, patient care, and documentation  for this visit, including the following: review of clinical lab tests; review of medical history, review of how elevated insulin  levels affect his weight and nutritinoal counseling   He was informed of the importance of frequent follow up visits to maximize his success with intensive lifestyle modifications for his multiple health conditions.    Jasmine Mesi, MD

## 2023-06-30 NOTE — Addendum Note (Signed)
 Addended by: Jasmine Mesi D on: 06/30/2023 12:52 PM   Modules accepted: Level of Service

## 2023-07-01 ENCOUNTER — Ambulatory Visit (HOSPITAL_BASED_OUTPATIENT_CLINIC_OR_DEPARTMENT_OTHER): Payer: Self-pay | Admitting: Family Medicine

## 2023-07-24 ENCOUNTER — Other Ambulatory Visit (INDEPENDENT_AMBULATORY_CARE_PROVIDER_SITE_OTHER): Payer: Self-pay | Admitting: Family Medicine

## 2023-07-29 ENCOUNTER — Other Ambulatory Visit (INDEPENDENT_AMBULATORY_CARE_PROVIDER_SITE_OTHER): Payer: Self-pay | Admitting: Family Medicine

## 2023-08-11 ENCOUNTER — Ambulatory Visit (INDEPENDENT_AMBULATORY_CARE_PROVIDER_SITE_OTHER): Admitting: Family Medicine

## 2023-08-11 VITALS — BP 126/66 | HR 60 | Temp 98.4°F | Ht 68.0 in | Wt 200.0 lb

## 2023-08-11 DIAGNOSIS — Z6830 Body mass index (BMI) 30.0-30.9, adult: Secondary | ICD-10-CM

## 2023-08-11 DIAGNOSIS — E66811 Obesity, class 1: Secondary | ICD-10-CM

## 2023-08-11 DIAGNOSIS — E669 Obesity, unspecified: Secondary | ICD-10-CM | POA: Diagnosis not present

## 2023-08-11 DIAGNOSIS — R7303 Prediabetes: Secondary | ICD-10-CM

## 2023-08-11 MED ORDER — METFORMIN HCL 500 MG PO TABS: 1000.0000 mg | ORAL_TABLET | Freq: Every day | ORAL | 0 refills | Status: DC

## 2023-08-11 NOTE — Progress Notes (Signed)
   SUBJECTIVE:  Chief Complaint: Obesity  Interim History: Patient is wondering about expediting his weight loss.  He is interested in medication options.  He has been being mindful of food intake and is trying to stick around 1500-1700 calories but isn't watching his protein intake.  He is eating quite a bit of meat but isn't necessarily measuring or weighing.  He Doesn't like [place] sausage. Over the next few weeks he is working and cleaning up his pool at home.   Dearies is here to discuss his progress with his obesity treatment plan. He is on the practicing portion control and making smarter food choices, such as increasing vegetables and decreasing simple carbohydrates and states he is following his eating plan approximately 95 % of the time. He states he is not exercising 0 minutes times per week.   OBJECTIVE: Visit Diagnoses: Problem List Items Addressed This Visit   None Visit Diagnoses       Prediabetes    -  Primary   Relevant Medications   metFORMIN  (GLUCOPHAGE ) 500 MG tablet     Obesity, starting BMI 32.99       Relevant Medications   metFORMIN  (GLUCOPHAGE ) 500 MG tablet     BMI 30.0-30.9,adult           Vitals Temp: 98.4 F (36.9 C) BP: 126/66 Pulse Rate: 60 SpO2: 98 %   Anthropometric Measurements Height: 5' 8 (1.727 m) Weight: 200 lb (90.7 kg) BMI (Calculated): 30.42 Weight at Last Visit: 210lb Weight Lost Since Last Visit: 10lb Weight Gained Since Last Visit: 0lb Starting Weight: 217lb Total Weight Loss (lbs): 17 lb (7.711 kg) Peak Weight: 229lb   Body Composition  Body Fat %: 28.8 % Fat Mass (lbs): 57.8 lbs Muscle Mass (lbs): 135.6 lbs Total Body Water (lbs): 98.2 lbs Visceral Fat Rating : 17   Other Clinical Data Fasting: No Labs: No Today's Visit #: 3 Starting Date: 06/16/23     ASSESSMENT AND PLAN: Assessment & Plan Prediabetes Patient reports no GI side efforts of metformin .  He needs  refill today and denies feeling like he  needs an increase in dose. Obesity, starting BMI 32.99  BMI 30.0-30.9,adult    Diet: Azreal is currently in the action stage of change. As such, his goal is to continue with weight loss efforts and has agreed to practicing portion control and making smarter food choices, such as increasing vegetables and decreasing simple carbohydrates. We discussed focusing on protein consumption first prior to eating simple carbs or vegetables as the weather warms to ensure protein intake is close to goal.  Exercise:  Older adults should determine their level of effort for physical activity relative to their level of fitness.  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  Behavior Modification:  We discussed the following Behavioral Modification Strategies today: increasing lean protein intake, decreasing simple carbohydrates, increasing vegetables, meal planning and cooking strategies, keeping healthy foods in the home, avoiding temptations, and planning for success.   Return in about 4 weeks (around 09/08/2023).   He was informed of the importance of frequent follow up visits to maximize his success with intensive lifestyle modifications for his multiple health conditions.  Attestation Statements:   Reviewed by clinician on day of visit: allergies, medications, problem list, medical history, surgical history, family history, social history, and previous encounter notes.    Adelita Cho, MD

## 2023-08-16 DIAGNOSIS — K080 Exfoliation of teeth due to systemic causes: Secondary | ICD-10-CM | POA: Diagnosis not present

## 2023-09-08 ENCOUNTER — Encounter (HOSPITAL_BASED_OUTPATIENT_CLINIC_OR_DEPARTMENT_OTHER): Payer: Self-pay | Admitting: *Deleted

## 2023-09-14 ENCOUNTER — Ambulatory Visit (INDEPENDENT_AMBULATORY_CARE_PROVIDER_SITE_OTHER): Admitting: Family Medicine

## 2023-09-18 ENCOUNTER — Other Ambulatory Visit (HOSPITAL_BASED_OUTPATIENT_CLINIC_OR_DEPARTMENT_OTHER): Payer: Self-pay | Admitting: Family Medicine

## 2023-09-19 ENCOUNTER — Ambulatory Visit (INDEPENDENT_AMBULATORY_CARE_PROVIDER_SITE_OTHER): Admitting: Nurse Practitioner

## 2023-09-19 ENCOUNTER — Encounter (INDEPENDENT_AMBULATORY_CARE_PROVIDER_SITE_OTHER): Payer: Self-pay | Admitting: Nurse Practitioner

## 2023-09-19 VITALS — BP 133/73 | HR 66 | Temp 99.1°F | Ht 68.0 in | Wt 194.0 lb

## 2023-09-19 DIAGNOSIS — E66811 Obesity, class 1: Secondary | ICD-10-CM

## 2023-09-19 DIAGNOSIS — E7849 Other hyperlipidemia: Secondary | ICD-10-CM

## 2023-09-19 DIAGNOSIS — R7303 Prediabetes: Secondary | ICD-10-CM | POA: Diagnosis not present

## 2023-09-19 DIAGNOSIS — Z6830 Body mass index (BMI) 30.0-30.9, adult: Secondary | ICD-10-CM

## 2023-09-19 DIAGNOSIS — Z6829 Body mass index (BMI) 29.0-29.9, adult: Secondary | ICD-10-CM

## 2023-09-19 MED ORDER — METFORMIN HCL 500 MG PO TABS: 1000.0000 mg | ORAL_TABLET | Freq: Every day | ORAL | 0 refills | Status: DC

## 2023-09-19 NOTE — Progress Notes (Signed)
 Office: [phone]  /  Fax: [phone]  WEIGHT SUMMARY AND BIOMETRICS  Weight Lost Since Last Visit: 6 lb  Weight Gained Since Last Visit: 0   Vitals Temp: 99.1 F (37.3 C) BP: 133/73 Pulse Rate: 66 SpO2: 100 %   Anthropometric Measurements Height: 5' 8 (1.727 m) Weight: 194 lb (88 kg) BMI (Calculated): 29.5 Weight at Last Visit: 200 lb Weight Lost Since Last Visit: 6 lb Weight Gained Since Last Visit: 0 Starting Weight: 217 lb Total Weight Loss (lbs): 23 lb (10.4 kg) Peak Weight: 229 lb   Body Composition  Body Fat %: 28.2 % Fat Mass (lbs): 54.8 lbs Muscle Mass (lbs): 132.6 lbs Total Body Water (lbs): 96.4 lbs Visceral Fat Rating : 16   Other Clinical Data Fasting: No Labs: No Today's Visit #: 4 Starting Date: 06/16/23   Starting Weight 229 Current Weight 194 Total Weight loss: 35 pounds  HPI  Chief Complaint: OBESITY  Eric Delgado is here to discuss his progress with his obesity treatment plan. He is on the the Category 3 Plan and states he is following his eating plan approximately 60 % of the time. 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 No structured exercises.He did have both hips replaced and left knee, need right knee replaced.    Interval History:  Since last office visit he has lost 6 pounds.  He has been working on trying to maintain a 1500-calorie diet.  He states that he is trying to follow it at least 60% of the time.  Reviewed her 24-hour food recall with patient. He gets either a sausage and egg biscuit for breakfast which is 300 cal or has eggs and Canadian bacon.  Lunch normally is a sandwich or salad if he does have a salad he is not putting protein on it.  Dinner is usually a lean protein and vegetable.  He does eat dinner at 9 PM and goes to bed at 10 but does not endorse any heartburn symptoms but is dealing with difficulty sleeping.  He is  on trazodone  for sleep and states it does help somewhat.  He has been using 25-calorie popcorn for snacking and is drinking approximately 100 200 to 60 ounces of water a day.  He does however have 1 mixed drink nightly with sugar-free mixer or 2 beers every night he uses these as most of his snack calories he states.  He continues to take metformin  500 mg 2 tabs in the morning he denies any symptoms of GI upset or diarrhea.   Pharmacotherapy for weight loss: He is currently taking Metformin  for medical weight loss.  Denies side effects.       PHYSICAL EXAM:  Blood pressure 133/73, pulse 66, temperature 99.1 F (37.3 C), height 5' 8 (1.727 m), weight 194 lb (88 kg), SpO2 100%. Body mass index is 29.5 kg/m.  General: He is overweight, cooperative, alert, well developed, and in no acute distress. PSYCH: Has normal mood, affect and thought process.   Extremities: No edema.  Neurologic: No gross sensory or motor deficits. No tremors or fasciculations noted.    DIAGNOSTIC DATA REVIEWED:  He is concerned about his kidney function states they were previously low when reviewing his chart it does appear that he had a GFR of 53 in April 2020 4 repeat does show in May 2025 that his levels had increased to 87 he continues  to push fluids and tries to avoid nephrotoxic medications  BMET    Component Value Date/Time   NA 131 (L) 06/16/2023 0833   K 4.6 06/16/2023 0833   CL 97 06/16/2023 0833   CO2 21 06/16/2023 0833   GLUCOSE 102 (H) 06/16/2023 0833   GLUCOSE 107 (H) 04/23/2021 0804   BUN 23 06/16/2023 0833   CREATININE 0.96 06/16/2023 0833   CALCIUM  9.6 06/16/2023 0833   GFRNONAA >60 05/21/2020 0612   GFRAA >90 12/22/2013 0544   Lab Results  Component Value Date   HGBA1C 5.6 06/16/2023   HGBA1C 5.7 (H) 05/17/2022   Lab Results  Component Value Date   INSULIN  14.8 06/16/2023   Lab Results  Component Value Date   TSH 2.630 06/16/2023   CBC    Component Value Date/Time   WBC 8.1  06/16/2023 0833   WBC 13.4 (H) 05/21/2020 0612   RBC 4.49 06/16/2023 0833   RBC 3.90 (L) 05/21/2020 0612   HGB 13.3 06/16/2023 0833   HCT 39.0 06/16/2023 0833   PLT 230 06/16/2023 0833   MCV 87 06/16/2023 0833   MCH 29.6 06/16/2023 0833   MCH 30.3 05/21/2020 0612   MCHC 34.1 06/16/2023 0833   MCHC 34.9 05/21/2020 0612   RDW 13.7 06/16/2023 0833   Iron Studies No results found for: IRON, TIBC, FERRITIN, IRONPCTSAT Lipid Panel     Component Value Date/Time   CHOL 194 06/16/2023 0833   TRIG 127 06/16/2023 0833   HDL 59 06/16/2023 0833   CHOLHDL 3.2 05/17/2022 1004   CHOLHDL 3 04/23/2021 0804   VLDL 27.0 04/23/2021 0804   LDLCALC 113 (H) 06/16/2023 0833   LDLDIRECT 173.0 03/21/2015 0904   Hepatic Function Panel     Component Value Date/Time   PROT 7.1 06/16/2023 0833   ALBUMIN 4.6 06/16/2023 0833   AST 23 06/16/2023 0833   ALT 23 06/16/2023 0833   ALKPHOS 54 06/16/2023 0833   BILITOT 0.5 06/16/2023 0833   BILIDIR 0.0 09/15/2015 1542      Component Value Date/Time   TSH 2.630 06/16/2023 0833   Nutritional Lab Results  Component Value Date   VD25OH 45.7 06/16/2023     ASSESSMENT AND PLAN  Class 1 obesity with serious comorbidity and body mass index (BMI) of 30.0 to 30.9 in adult, unspecified, current BMI 29 obesity type TREATMENT PLAN FOR OBESITY:  Recommended Dietary Goals  Eric Delgado is currently in the action stage of change. As such, his goal is to continue weight management plan. He has agreed to continue the category 3 plan.  His goals are to increase protein intake daily discussed incorporating protein shake with a salad for lunch as it does seem he is lacking in protein at this meal.  He is to continue pushing fluids with an emphasis on water and at least to 100 ounces per day.  I have also discussed trying to put up the time for his last meal which is currently at 9 PM and goes to bed at 10 PM.  Discussed that this is detrimental to good sleep  health.  Behavioral Intervention  We discussed the following Behavioral Modification Strategies today: increasing lean protein intake to established goals and continue to work on maintaining a reduced calorie state, getting the recommended amount of protein, incorporating whole foods, making healthy choices, staying well hydrated and practicing mindfulness when eating..  Additional resources provided today: Protein Shake information  Recommended Physical Activity Goals  Eric Delgado has been advised to work up  to 150 minutes of moderate intensity aerobic activity a week and strengthening exercises 2-3 times per week for cardiovascular health, weight loss maintenance and preservation of muscle mass. 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    Pharmacotherapy We discussed various medication options to help Eric Delgado with his weight loss efforts and we both agreed to continue metformin  500 mg 2 tabs every morning which is being used off label to assist with weight loss due to his prediabetes.  ASSOCIATED CONDITIONS ADDRESSED TODAY  Action/Plan  Prediabetes Continue to focus on nutrition plan as well as exercise.  Continue metformin  as directed. Plan to recheck A1c at next office visit. -     metFORMIN  HCl; Take 2 tablets (1,000 mg total) by mouth daily with breakfast.  Dispense: 180 tablet; Refill: 0  Other hyperlipidemia       Continue use of rosuvastatin  and instructed to continue to decrease saturated fats in his diet.       Advised we will check fasting labs at next visit as he is concerned about cholesterol levels as well as kidney and liver functions.          Return in about 2 months (around 11/19/2023).SABRA He was informed of the importance of frequent follow up visits to maximize his success with intensive lifestyle modifications for his multiple health conditions. He  does work on the road and is unable to keep appointments as frequently as once a month.    ATTESTASTION STATEMENTS:  Reviewed by clinician on day of visit: allergies, medications, problem list, medical history, surgical history, family history, social history, and previous encounter notes.   Time spent on visit including pre-visit chart review and post-visit care and charting was 50 minutes.   Kaneshia Cater ANP-C

## 2023-10-07 ENCOUNTER — Other Ambulatory Visit (HOSPITAL_BASED_OUTPATIENT_CLINIC_OR_DEPARTMENT_OTHER): Payer: Self-pay | Admitting: Family Medicine

## 2023-10-31 ENCOUNTER — Telehealth (HOSPITAL_BASED_OUTPATIENT_CLINIC_OR_DEPARTMENT_OTHER): Payer: Self-pay | Admitting: *Deleted

## 2023-10-31 DIAGNOSIS — E785 Hyperlipidemia, unspecified: Secondary | ICD-10-CM

## 2023-10-31 DIAGNOSIS — I10 Essential (primary) hypertension: Secondary | ICD-10-CM

## 2023-10-31 DIAGNOSIS — R7303 Prediabetes: Secondary | ICD-10-CM

## 2023-10-31 NOTE — Telephone Encounter (Signed)
 Please advise if you would like to order labs for patient

## 2023-10-31 NOTE — Addendum Note (Signed)
 Addended by: DE PERU, Gordie Crumby J on: 10/31/2023 09:57 AM   Modules accepted: Orders

## 2023-10-31 NOTE — Telephone Encounter (Signed)
 Copied from CRM #[id number]. Topic: Clinical - Request for Lab/Test Order >> Oct 28, 2023  1:55 PM Avram MATSU wrote: Reason for CRM: patient would like to come in for labs, he stated MD De peru should know what labs to order.    ----------------------------------------------------------------------- From previous Reason for Contact - Scheduling: Patient/patient representative is calling to schedule an appointment. Refer to attachments for appointment information.

## 2023-11-04 ENCOUNTER — Other Ambulatory Visit (HOSPITAL_BASED_OUTPATIENT_CLINIC_OR_DEPARTMENT_OTHER): Payer: Self-pay | Admitting: *Deleted

## 2023-11-04 DIAGNOSIS — R7303 Prediabetes: Secondary | ICD-10-CM | POA: Diagnosis not present

## 2023-11-04 DIAGNOSIS — E785 Hyperlipidemia, unspecified: Secondary | ICD-10-CM | POA: Diagnosis not present

## 2023-11-04 DIAGNOSIS — I10 Essential (primary) hypertension: Secondary | ICD-10-CM

## 2023-11-05 LAB — LIPID PANEL
Chol/HDL Ratio: 2.9 ratio (ref 0.0–5.0)
Cholesterol, Total: 195 mg/dL (ref 100–199)
HDL: 67 mg/dL (ref >39)
LDL Chol Calc (NIH): 116 mg/dL — ABNORMAL HIGH (ref 0–99)
Triglycerides: 64 mg/dL (ref 0–149)
VLDL Cholesterol Cal: 12 mg/dL (ref 5–40)

## 2023-11-05 LAB — CBC WITH DIFFERENTIAL/PLATELET
Basophils Absolute: 0 x10E3/uL (ref 0.0–0.2)
Basos: 1 %
EOS (ABSOLUTE): 0.2 x10E3/uL (ref 0.0–0.4)
Eos: 4 %
Hematocrit: 39.8 % (ref 37.5–51.0)
Hemoglobin: 13.1 g/dL (ref 13.0–17.7)
Immature Grans (Abs): 0 x10E3/uL (ref 0.0–0.1)
Immature Granulocytes: 0 %
Lymphocytes Absolute: 1 x10E3/uL (ref 0.7–3.1)
Lymphs: 16 %
MCH: 29.6 pg (ref 26.6–33.0)
MCHC: 32.9 g/dL (ref 31.5–35.7)
MCV: 90 fL (ref 79–97)
Monocytes Absolute: 0.6 x10E3/uL (ref 0.1–0.9)
Monocytes: 10 %
Neutrophils Absolute: 4.1 x10E3/uL (ref 1.4–7.0)
Neutrophils: 68 %
Platelets: 242 x10E3/uL (ref 150–450)
RBC: 4.42 x10E6/uL (ref 4.14–5.80)
RDW: 13.7 % (ref 11.6–15.4)
WBC: 5.9 x10E3/uL (ref 3.4–10.8)

## 2023-11-05 LAB — BASIC METABOLIC PANEL WITH GFR
BUN/Creatinine Ratio: 24 (ref 10–24)
BUN: 31 mg/dL — ABNORMAL HIGH (ref 8–27)
CO2: 23 mmol/L (ref 20–29)
Calcium: 9.7 mg/dL (ref 8.6–10.2)
Chloride: 97 mmol/L (ref 96–106)
Creatinine, Ser: 1.28 mg/dL — ABNORMAL HIGH (ref 0.76–1.27)
Glucose: 95 mg/dL (ref 70–99)
Potassium: 5 mmol/L (ref 3.5–5.2)
Sodium: 135 mmol/L (ref 134–144)
eGFR: 61 mL/min/1.73 (ref >59)

## 2023-11-05 LAB — HEMOGLOBIN A1C
Est. average glucose Bld gHb Est-mCnc: 111 mg/dL
Hgb A1c MFr Bld: 5.5 % (ref 4.8–5.6)

## 2023-11-09 ENCOUNTER — Ambulatory Visit (HOSPITAL_BASED_OUTPATIENT_CLINIC_OR_DEPARTMENT_OTHER): Payer: Self-pay | Admitting: Family Medicine

## 2023-11-10 ENCOUNTER — Ambulatory Visit: Payer: Self-pay

## 2023-11-10 NOTE — Telephone Encounter (Signed)
  FYI Only or Action Required?: FYI only for provider.  Patient was last seen in primary care on 09/19/2023 by Jude Lonell BRAVO, NP.  Called Nurse Triage reporting Back Pain.  Symptoms began a week ago.  Interventions attempted: Prescription medications: muscle relaxer and Rest, hydration, or home remedies.  Symptoms are: gradually improving.  Triage Disposition: See PCP When Office is Open (Within 3 Days) Appointment Scheduled  Patient/caregiver understands and will follow disposition?: YesCopied from CRM #[id number]. Topic: Clinical - Red Word Triage >> Nov 10, 2023 10:18 AM Amy B wrote: Red Word that prompted transfer to Nurse Triage: Back pain waking him from sleep at night.  Pulled muscle Reason for Disposition  [1] MODERATE back pain (e.g., interferes with normal activities) AND [2] present > 3 days  Answer Assessment - Initial Assessment Questions Pulled back last Thursday at work pulling a pipe full of water. Gradual improvement after one week but still affecting ADL's and sleep.    1. ONSET: When did the pain begin? (e.g., minutes, hours, days)     Last Thursday 2. LOCATION: Where does it hurt? (upper, mid or lower back)     Left shoulder towards mid back  3. SEVERITY: How bad is the pain?  (e.g., Scale 1-10; mild, moderate, or severe)     Was 8-9/10 pain and is slowly getting better- today is 6-7/10 4. PATTERN: Is the pain constant? (e.g., yes, no; constant, intermittent)      Constant, laying down, sleeping, sitting, standing 5. RADIATION: Does the pain shoot into your legs or somewhere else?     denies 6. CAUSE:  What do you think is causing the back pain?      Pulled it at work 7. BACK OVERUSE:  Any recent lifting of heavy objects, strenuous work or exercise?     Physical job 8. MEDICINES: What have you taken so far for the pain? (e.g., nothing, acetaminophen , NSAIDS)     Has tried wife's muscle relaxer at night to help sleep but only able to sleep in  about 2hr spurts due to pain.  9. NEUROLOGIC SYMPTOMS: Do you have any weakness, numbness, or problems with bowel/bladder control?     When stops using it, shoulder gets stiff 10. OTHER SYMPTOMS: Do you have any other symptoms? (e.g., fever, abdomen pain, burning with urination, blood in urine)       denies  Protocols used: Back Pain-A-AH

## 2023-11-11 ENCOUNTER — Encounter (HOSPITAL_BASED_OUTPATIENT_CLINIC_OR_DEPARTMENT_OTHER): Payer: Self-pay | Admitting: Family Medicine

## 2023-11-11 ENCOUNTER — Ambulatory Visit (HOSPITAL_BASED_OUTPATIENT_CLINIC_OR_DEPARTMENT_OTHER): Admitting: Family Medicine

## 2023-11-11 VITALS — BP 131/65 | HR 64 | Ht 68.0 in | Wt 198.8 lb

## 2023-11-11 DIAGNOSIS — M545 Low back pain, unspecified: Secondary | ICD-10-CM | POA: Insufficient documentation

## 2023-11-11 DIAGNOSIS — M546 Pain in thoracic spine: Secondary | ICD-10-CM | POA: Diagnosis not present

## 2023-11-11 DIAGNOSIS — G8929 Other chronic pain: Secondary | ICD-10-CM

## 2023-11-11 MED ORDER — MELOXICAM 15 MG PO TABS: 15.0000 mg | ORAL_TABLET | Freq: Every day | ORAL | 1 refills | Status: DC

## 2023-11-11 MED ORDER — CYCLOBENZAPRINE HCL 5 MG PO TABS: 5.0000 mg | ORAL_TABLET | Freq: Three times a day (TID) | ORAL | 1 refills | Status: DC | PRN

## 2023-11-11 NOTE — Assessment & Plan Note (Signed)
 Chronic pain with osteoarthritis and bone spurs. Conservative management preferred, surgery as last resort. - Discuss oral medications, physical therapy, and home exercises. - Consider targeted injections if conservative measures fail.

## 2023-11-11 NOTE — Patient Instructions (Signed)
   Medication Instructions:  Your physician recommends that you continue on your current medications as directed. Please refer to the Current Medication list given to you today. --If you need a refill on any your medications before your next appointment, please call your pharmacy first. If no refills are authorized on file call the office.--   Follow-Up: Your next appointment:   Your physician recommends that you schedule a follow-up appointment in: 2-3 month follow up  with Dr. de Peru  You will receive a text message or e-mail with a link to a survey about your care and experience with [place] today! We would greatly appreciate your feedback!   Thanks for letting us be apart of your health journey!!  Primary Care and Sports Medicine   Dr. Ceasar Mons [place]   We encourage you to activate your patient portal called "MyChart".  Sign up information is provided on this After Visit Summary.  MyChart is used to connect with patients for Virtual Visits (Telemedicine).  Patients are able to view lab/test results, encounter notes, upcoming appointments, etc.  Non-urgent messages can be sent to your provider as well. To learn more about what you can do with MyChart, please visit --  [email or web address].

## 2023-11-11 NOTE — Progress Notes (Signed)
    Procedures performed today:    None.  Independent interpretation of notes and tests performed by another provider:   None.  Brief History, Exam, Impression, and Recommendations:    BP 131/65 (BP Location: Left Arm, Patient Position: Sitting, Cuff Size: Large)   Pulse 64   Ht 5' 8 (1.727 m)   Wt 198 lb 12.8 oz (90.2 kg)   SpO2 98%   BMI 30.23 kg/m   Discussed the use of AI scribe software for clinical note transcription with the patient, who gave verbal consent to proceed.  History of Present Illness Eric Delgado is a 68 year old male who presents with severe right shoulder pain following a recent injury.  He has severe pain in the back of his right shoulder, which began after pulling a large water pipe. The pain started last Thursday and reached a severity of 9 out of 10 last night, causing significant distress and inability to sleep. The pain is localized to the back of the shoulder, extending down the curve of the shoulder blade.  He has a history of right shoulder surgery approximately 10-15 years ago, possibly for a torn rotator cuff, but has not experienced similar issues with the shoulder since then. For pain management, he took meloxicam , which provided some relief by the following morning. He is unsure of the dosage but mentions it could be either 7.5 mg or 15 mg. He also took a muscle relaxer prescribed for his wife's sciatic nerve pain, which he found somewhat helpful.  He denies significant pain when reaching overhead or performing certain arm movements, although he does feel some discomfort during specific maneuvers. The pain is primarily in the area around the scapula.  He is currently taking metformin  500 mg tablets, two tablets once a day, for blood sugar management. He mentions concerns about his cholesterol levels and takes a laxative nightly to counteract constipation caused by cholesterol medication. He is also aware of his potassium levels being  normal.  Acute left-sided thoracic back pain Assessment & Plan: Acute strain due to recent exertion. NSAID therapy effective. - Prescribe meloxicam  15 mg daily, reduce to half tablet as pain improves. Avoid other NSAIDs. - Consider Flexeril  for muscle relaxation, caution with drowsiness. - recommend physical therapy referral - patient prefers to hold off for now, he will reach out if wanting to proceed with referral   Chronic low back pain, unspecified back pain laterality, unspecified whether sciatica present Assessment & Plan: Chronic pain with osteoarthritis and bone spurs. Conservative management preferred, surgery as last resort. - Discuss oral medications, physical therapy, and home exercises. - Consider targeted injections if conservative measures fail.   Other orders -     Meloxicam ; Take 1 tablet (15 mg total) by mouth daily.  Dispense: 30 tablet; Refill: 1 -     Cyclobenzaprine  HCl; Take 1 tablet (5 mg total) by mouth 3 (three) times daily as needed for muscle spasms.  Dispense: 30 tablet; Refill: 1  Return in about 3 months (around 02/10/2024) for shoulder pain.   ___________________________________________ Eric Rucinski de [place], MD, ABFM, CAQSM Primary Care and Sports Medicine [facility]

## 2023-11-11 NOTE — Assessment & Plan Note (Signed)
 Acute strain due to recent exertion. NSAID therapy effective. - Prescribe meloxicam  15 mg daily, reduce to half tablet as pain improves. Avoid other NSAIDs. - Consider Flexeril  for muscle relaxation, caution with drowsiness. - recommend physical therapy referral - patient prefers to hold off for now, he will reach out if wanting to proceed with referral

## 2023-11-15 NOTE — Progress Notes (Deleted)
   SUBJECTIVE: Discussed the use of AI scribe software for clinical note transcription with the patient, who gave verbal consent to proceed.  Chief Complaint: Obesity  Interim History: ***  Eric Delgado is here to discuss his progress with his obesity treatment plan. He is on the {HWW Weight Loss Plan:210964005} and states he {CHL AMB IS/IS NOT:210130109} following his eating plan approximately *** % of the time. He states he {CHL AMB IS/IS NOT:210130109} exercising *** minutes *** times per week.  Seen by PCP and had follow up of A1c, renal function 11/04/23 OBJECTIVE: Visit Diagnoses: Problem List Items Addressed This Visit     HLD (hyperlipidemia)   Essential hypertension - Primary   Other fatigue   Other Visit Diagnoses       Prediabetes         Stage 3a chronic kidney disease (HCC)         Obesity, starting BMI 32.99           No data recorded No data recorded No data recorded No data recorded   ASSESSMENT AND PLAN:  Diet: Eric Delgado {CHL AMB IS/IS NOT:210130109} currently in the action stage of change. As such, his goal is to {HWW Weight Loss Efforts:210964006}. He {HAS HAS WNU:81165} agreed to {HWW Weight Loss Plan:210964005}.  Exercise: Eric Delgado has been instructed {HWW Exercise:210964007} for weight loss and overall health benefits.   Behavior Modification:  We discussed the following Behavioral Modification Strategies today: {HWW Behavior Modification:210964008}. We discussed various medication options to help Eric Delgado with his weight loss efforts and we both agreed to ***.  No follow-ups on file.SABRA He was informed of the importance of frequent follow up visits to maximize his success with intensive lifestyle modifications for his multiple health conditions.  Attestation Statements:   Reviewed by clinician on day of visit: allergies, medications, problem list, medical history, surgical history, family history, social history, and previous encounter notes.   Time  spent on visit including pre-visit chart review and post-visit care and charting was *** minutes.    Everline Mahaffy, PA-C

## 2023-11-16 ENCOUNTER — Ambulatory Visit (INDEPENDENT_AMBULATORY_CARE_PROVIDER_SITE_OTHER): Admitting: Physician Assistant

## 2023-12-08 ENCOUNTER — Ambulatory Visit (INDEPENDENT_AMBULATORY_CARE_PROVIDER_SITE_OTHER): Admitting: Nurse Practitioner

## 2023-12-08 ENCOUNTER — Encounter (INDEPENDENT_AMBULATORY_CARE_PROVIDER_SITE_OTHER): Payer: Self-pay | Admitting: Nurse Practitioner

## 2023-12-08 VITALS — BP 126/54 | HR 76 | Temp 98.0°F | Ht 68.0 in | Wt 193.0 lb

## 2023-12-08 DIAGNOSIS — M5416 Radiculopathy, lumbar region: Secondary | ICD-10-CM | POA: Insufficient documentation

## 2023-12-08 DIAGNOSIS — R7303 Prediabetes: Secondary | ICD-10-CM

## 2023-12-08 DIAGNOSIS — E663 Overweight: Secondary | ICD-10-CM

## 2023-12-08 DIAGNOSIS — I129 Hypertensive chronic kidney disease with stage 1 through stage 4 chronic kidney disease, or unspecified chronic kidney disease: Secondary | ICD-10-CM

## 2023-12-08 DIAGNOSIS — M25512 Pain in left shoulder: Secondary | ICD-10-CM | POA: Diagnosis not present

## 2023-12-08 DIAGNOSIS — E785 Hyperlipidemia, unspecified: Secondary | ICD-10-CM | POA: Diagnosis not present

## 2023-12-08 DIAGNOSIS — N182 Chronic kidney disease, stage 2 (mild): Secondary | ICD-10-CM

## 2023-12-08 DIAGNOSIS — Z6827 Body mass index (BMI) 27.0-27.9, adult: Secondary | ICD-10-CM

## 2023-12-08 DIAGNOSIS — I10 Essential (primary) hypertension: Secondary | ICD-10-CM

## 2023-12-08 MED ORDER — METFORMIN HCL 500 MG PO TABS: 1000.0000 mg | ORAL_TABLET | Freq: Every day | ORAL | 0 refills | Status: DC

## 2023-12-08 MED ORDER — PHENTERMINE HCL 8 MG PO TABS: ORAL_TABLET | ORAL | 0 refills | Status: DC

## 2023-12-08 NOTE — Progress Notes (Signed)
 Office: [phone]  /  Fax: [phone]  WEIGHT SUMMARY AND BIOMETRICS  Weight Lost Since Last Visit: 1  Weight Gained Since Last Visit: 0   Vitals Temp: 98 F (36.7 C) BP: (!) 126/54 Pulse Rate: 76 SpO2: 99 %   Anthropometric Measurements Height: 5' 8 (1.727 m) Weight: 193 lb (87.5 kg) BMI (Calculated): 29.35 Weight at Last Visit: 194 lb Weight Lost Since Last Visit: 1 Weight Gained Since Last Visit: 0 Starting Weight: 217 lb Total Weight Loss (lbs): 24 lb (10.9 kg)   Body Composition  Body Fat %: 27.1 % Fat Mass (lbs): 52.4 lbs Muscle Mass (lbs): 134.2 lbs Total Body Water (lbs): 95.2 lbs   Other Clinical Data Fasting: no Labs: no Today's Visit #: 6 Starting Date: 06/16/23    Total Weight Loss: 24 pounds Percent of body weight lost: 11% Bio Impedance Data reviewed with patient: Muscle is up 1.6 pounds, Adipose is down 1.6 pounds. PBF has decreased from 28.2% to 27.1% and visceral fat rating has stayed the same at 16.  HPI  Chief Complaint: OBESITY  Eric Delgado is here to discuss his progress with his obesity treatment plan. He is on the the Category 3 Plan and states he is following his eating plan approximately 70 % of the time. He states he is not doing structured activity but does get a lot of activity at work.  He is drinking 128 ounces of water daily. He does have issues with constipation and uses dulcolax daily.  He has been getting his 120 grams of protein daily.   Interval History:  Since last office visit he has been dealing with increasing hunger- more cravings and will give in. He has been some biscuits, pasta, cake- more frequently. He is finding it harder and harder to control his appetite.  He is interested in GLP1's but insurance will not cover.   He continues on Metformin  500 mg 2 tabs at breakfast for prediabetes. He does have some issues with dry eyes.  Lab Results  Component Value Date   HGBA1C 5.5 11/04/2023     Eric Delgado does have  hypertension currently well controlled with lisinopril  40 mg every day. Denies headaches, chest pain, shortness of breath at rest and dizziness. BP Readings from Last 3 Encounters:  12/08/23 (!) 126/54  11/11/23 131/65  09/19/23 133/73    He continues on Rosuvastatin  20 mg every day for pure hypercholesterolemia and denies side effects. Continues to limit saturated fats. Lab Results  Component Value Date   CHOL 195 11/04/2023   HDL 67 11/04/2023   LDLCALC 116 (H) 11/04/2023   LDLDIRECT 173.0 03/21/2015   TRIG 64 11/04/2023   CHOLHDL 2.9 11/04/2023    Kidney functions have improved from stage 3a to 2.  Continue to push water and limit use of NSAID's.   He has had his labs rechecked at his PCP- normal electrolytes, blood count, A1c and glucose. Kidney functions remain in stage 2 CKD. Continues to have elevated LDL- continue Rosuvastatin  20 mg every day.     PHYSICAL EXAM:  Blood pressure (!) 126/54, pulse 76, temperature 98 F (36.7 C), height 5' 8 (1.727 m), weight 193 lb (87.5 kg), SpO2 99%. Body mass index is 29.35 kg/m.  General: Well Developed, well nourished, and in no acute distress.  HEENT: Normocephalic, atraumatic; EOMI, sclerae are anicteric. Skin: Warm and dry, good turgor Chest:  Normal excursion, shape, no gross ABN Respiratory: No conversational dyspnea; speaking in full sentences NeuroM-Sk:  Normal gross ROM *  4 extremities  Psych: A and O X 3, insight adequate, mood- full    DIAGNOSTIC DATA REVIEWED:  Last metabolic panel Lab Results  Component Value Date   GLUCOSE 95 11/04/2023   NA 135 11/04/2023   K 5.0 11/04/2023   CL 97 11/04/2023   CO2 23 11/04/2023   BUN 31 (H) 11/04/2023   CREATININE 1.28 (H) 11/04/2023   EGFR 61 11/04/2023   CALCIUM  9.7 11/04/2023   PROT 7.1 06/16/2023   ALBUMIN 4.6 06/16/2023   LABGLOB 2.5 06/16/2023   AGRATIO 2.0 06/14/2022   BILITOT 0.5 06/16/2023   ALKPHOS 54 06/16/2023   AST 23 06/16/2023   ALT 23 06/16/2023    ANIONGAP 8 05/21/2020    Lab Results  Component Value Date   HGBA1C 5.5 11/04/2023     CBC    Component Value Date/Time   WBC 5.9 11/04/2023 0915   WBC 13.4 (H) 05/21/2020 0612   RBC 4.42 11/04/2023 0915   RBC 3.90 (L) 05/21/2020 0612   HGB 13.1 11/04/2023 0915   HCT 39.8 11/04/2023 0915   PLT 242 11/04/2023 0915   MCV 90 11/04/2023 0915   MCH 29.6 11/04/2023 0915   MCH 30.3 05/21/2020 0612   MCHC 32.9 11/04/2023 0915   MCHC 34.9 05/21/2020 0612   RDW 13.7 11/04/2023 0915    Lipid Panel  Lab Results  Component Value Date   CHOL 195 11/04/2023   HDL 67 11/04/2023   LDLCALC 116 (H) 11/04/2023   LDLDIRECT 173.0 03/21/2015   TRIG 64 11/04/2023   CHOLHDL 2.9 11/04/2023        ASSESSMENT AND PLAN  Overweight with body mass index (BMI) of 27 to 27.9 in adult TREATMENT PLAN FOR OBESITY:  Recommended Dietary Goals  Eric Delgado is currently in the action stage of change. As such, his goal is to continue weight management plan. He has agreed to the Category 3 Plan.  Behavioral Intervention  We discussed the following Behavioral Modification Strategies today: decreasing simple carbohydrates , better snacking choices, continue to work on maintaining a reduced calorie state, getting the recommended amount of protein, incorporating whole foods, making healthy choices, staying well hydrated and practicing mindfulness when eating., and increase protein intake, fibrous foods (25 grams per day for women, 30 grams for men) and water to improve satiety and decrease hunger signals. .   Recommended Physical Activity Goals  Eric Delgado has been advised to work up to 150 minutes of moderate intensity aerobic activity a week and strengthening exercises 2-3 times per week for cardiovascular health, weight loss maintenance and preservation of muscle mass.   He has agreed to Think about enjoyable ways to increase daily physical activity and overcoming barriers to exercise, Increase physical  activity in their day and reduce sedentary time (increase NEAT)., Increase volume of physical activity to a goal of 240 minutes a week, and Combine aerobic and strengthening exercises for efficiency and improved cardiometabolic health.   Pharmacotherapy We discussed various medication options to help Ryer with his weight loss efforts and we both agreed to start Lomira 8 mg Daily first thing in the morning.  Counseled on side effects- if he develops chest pain, shortness of breath at rest or palpitations he is to stop the medication.  PDMP is reviewed and appropriate. Consent for phentermine is signed.   ASSOCIATED CONDITIONS ADDRESSED TODAY  Action/Plan  Essential hypertension Continue hydrochlorothiazide  25 mg every day and lisinopril  40 mg every day Continue Category 3 meal plan  and DASH diet  Monitor BP and if consistently >140/90 notify PCP If develops headaches, chest pain, shortness of breath or dizziness go to ER Loss of 10-15% body weight can help improve blood pressures   Hyperlipidemia, unspecified hyperlipidemia type Focus on implementing category 3 meal plan, counseled indepth on limiting saturated fats- reviewed which foods have saturated fats Continue Rosuvastatin  20 mg every day  Loss of 10-15% body weight can improve lipid levels Focus on getting 240 minutes a week of moderate to high intensity exercise and add strength training  Prediabetes Continue Category 3  meal plan, limit simple carbohydrates Decreasing body weight by 10-15% can improve glucose levels Continue exercise with current goal of 150 minutes of moderate to high intensity exercise/week.  Continue Metformin  500 mg 2 tabs PO QAM   -     metFORMIN  HCl; Take 2 tablets (1,000 mg total) by mouth daily with breakfast.  Dispense: 180 tablet; Refill: 0  Chronic kidney disease (CKD), stage 2       Continue to push water throughout the day and limit NSAID use   Overweight with body mass index (BMI) of 27 to  27.9 in adult -     Phentermine HCl (LOMAIRA) 8 MG TABS; 1 tab daily by mouth            Return in about 4 weeks (around 01/05/2024).SABRA He was informed of the importance of frequent follow up visits to maximize his success with intensive lifestyle modifications for his multiple health conditions.   ATTESTASTION STATEMENTS:  Reviewed by clinician on day of visit: allergies, medications, problem list, medical history, surgical history, family history, social history, and previous encounter notes.     Bijan Ridgley ANP-C

## 2023-12-18 ENCOUNTER — Other Ambulatory Visit (HOSPITAL_BASED_OUTPATIENT_CLINIC_OR_DEPARTMENT_OTHER): Payer: Self-pay | Admitting: Family Medicine

## 2023-12-19 ENCOUNTER — Encounter: Payer: Self-pay | Admitting: Radiology

## 2023-12-23 ENCOUNTER — Other Ambulatory Visit (HOSPITAL_BASED_OUTPATIENT_CLINIC_OR_DEPARTMENT_OTHER): Payer: Self-pay | Admitting: Family Medicine

## 2024-01-05 ENCOUNTER — Ambulatory Visit (INDEPENDENT_AMBULATORY_CARE_PROVIDER_SITE_OTHER): Payer: Self-pay | Admitting: Nurse Practitioner

## 2024-01-05 ENCOUNTER — Encounter (INDEPENDENT_AMBULATORY_CARE_PROVIDER_SITE_OTHER): Payer: Self-pay | Admitting: Nurse Practitioner

## 2024-01-05 VITALS — BP 121/62 | HR 70 | Temp 98.2°F | Ht 68.0 in | Wt 193.0 lb

## 2024-01-05 DIAGNOSIS — E663 Overweight: Secondary | ICD-10-CM

## 2024-01-05 DIAGNOSIS — I10 Essential (primary) hypertension: Secondary | ICD-10-CM | POA: Diagnosis not present

## 2024-01-05 DIAGNOSIS — E785 Hyperlipidemia, unspecified: Secondary | ICD-10-CM

## 2024-01-05 DIAGNOSIS — R7303 Prediabetes: Secondary | ICD-10-CM

## 2024-01-05 DIAGNOSIS — Z6829 Body mass index (BMI) 29.0-29.9, adult: Secondary | ICD-10-CM

## 2024-01-05 MED ORDER — METFORMIN HCL 500 MG PO TABS: 1000.0000 mg | ORAL_TABLET | Freq: Every day | ORAL | 0 refills | Status: AC

## 2024-01-05 MED ORDER — PHENTERMINE HCL 8 MG PO TABS: ORAL_TABLET | ORAL | 0 refills | Status: DC

## 2024-01-05 NOTE — Progress Notes (Signed)
 Office: [phone]  /  Fax: [phone]  WEIGHT SUMMARY AND BIOMETRICS  Weight Lost Since Last Visit: 0  Weight Gained Since Last Visit: 0   Vitals Temp: 98.2 F (36.8 C) BP: 121/62 Pulse Rate: 70 SpO2: 97 %   Anthropometric Measurements Height: 5' 8 (1.727 m) Weight: 193 lb (87.5 kg) BMI (Calculated): 29.35 Weight at Last Visit: 193 lb Weight Lost Since Last Visit: 0 Weight Gained Since Last Visit: 0 Starting Weight: 217 lb Total Weight Loss (lbs): 24 lb (10.9 kg)   Body Composition  Body Fat %: 26.9 % Fat Mass (lbs): 52 lbs Muscle Mass (lbs): 134 lbs Total Body Water (lbs): 95.4 lbs Visceral Fat Rating : 16   Other Clinical Data Fasting: yes Labs: no Today's Visit #: 6 Starting Date: 06/16/23    Total Weight Loss: 24 pounds Percent of body weight lost: 11%   Bio Impedance Data reviewed with patient: Muscle is down 0.2 pounds, adipose is down 0.4 pounds. PBF decreased from 27.1% to 26.9%. Visceral fat started at 19 and is now 16  HPI  Chief Complaint: OBESITY  Eric Delgado is here to discuss his progress with his obesity treatment plan. He is on the the Category 3 Plan and states he is following his eating plan approximately 50 % of the time. 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   Interval History:  He is falling short on protein every day. He has been having more carbohydrates, chips , pretzels. Drinking 102 ounces of water a day and coffee with skim milk and monk fruit. He states he notices only a small effect from the Lomaira . I discussed possible cessation but he wants to increase the dose.  Advised we do not want to increase dose as he is not getting enough protein daily.  Breakfast: Cereal or egg and ham toast Lunch: Leftovers from dinner- roast beef but only 2 ounces Dinner:  chicken or hamburger and vegetables  Pharmacotherapy for weight loss: He is currently taking Phentermine  for  medical weight loss.  Denies side effects.  He is also on Metformin  500 mg 2 tabs QAM for prediabetes and denies side effects.   He continues on Lisinopril  40 mg every day and hydrochlorothiazide  25 mg every day for hypertension and BP'S are currently well controlled.  Denies headaches, chest pain, shortness of breath and dizziness. BP Readings from Last 3 Encounters:  01/05/24 121/62  12/08/23 (!) 126/54  11/11/23 131/65   He continues on Rosuvastatin  20 mg for hyperlipidemia without LDL remaining elevated at 883 at last check 11/04/23.  Denies side effects. Continues to follow with PCP Dr. De [place] regularly.   PHYSICAL EXAM:  Blood pressure 121/62, pulse 70, temperature 98.2 F (36.8 C), height 5' 8 (1.727 m), weight 193 lb (87.5 kg), SpO2 97%. Body mass index is 29.35 kg/m.  General: Well Developed, well nourished, and in no acute distress.  HEENT: Normocephalic, atraumatic; EOMI, sclerae are anicteric. Skin: Warm and dry, good turgor Chest:  Normal excursion, shape, no gross ABN Respiratory: No conversational dyspnea; speaking in full sentences NeuroM-Sk:  Normal gross ROM * 4 extremities  Psych: A and O X 3, insight adequate, mood- full    DIAGNOSTIC DATA REVIEWED:  BMET    Component Value Date/Time   NA 135 11/04/2023 0915   K 5.0 11/04/2023 0915   CL 97 11/04/2023 0915   CO2 23 11/04/2023 0915   GLUCOSE 95 11/04/2023 0915  GLUCOSE 107 (H) 04/23/2021 0804   BUN 31 (H) 11/04/2023 0915   CREATININE 1.28 (H) 11/04/2023 0915   CALCIUM  9.7 11/04/2023 0915   GFRNONAA >60 05/21/2020 0612   GFRAA >90 12/22/2013 0544   Lab Results  Component Value Date   HGBA1C 5.5 11/04/2023   HGBA1C 5.7 (H) 05/17/2022   Lab Results  Component Value Date   INSULIN  14.8 06/16/2023   Lab Results  Component Value Date   TSH 2.630 06/16/2023   CBC    Component Value Date/Time   WBC 5.9 11/04/2023 0915   WBC 13.4 (H) 05/21/2020 0612   RBC 4.42 11/04/2023 0915   RBC 3.90 (L)  05/21/2020 0612   HGB 13.1 11/04/2023 0915   HCT 39.8 11/04/2023 0915   PLT 242 11/04/2023 0915   MCV 90 11/04/2023 0915   MCH 29.6 11/04/2023 0915   MCH 30.3 05/21/2020 0612   MCHC 32.9 11/04/2023 0915   MCHC 34.9 05/21/2020 0612   RDW 13.7 11/04/2023 0915   Iron Studies No results found for: IRON, TIBC, FERRITIN, IRONPCTSAT Lipid Panel     Component Value Date/Time   CHOL 195 11/04/2023 0915   TRIG 64 11/04/2023 0915   HDL 67 11/04/2023 0915   CHOLHDL 2.9 11/04/2023 0915   CHOLHDL 3 04/23/2021 0804   VLDL 27.0 04/23/2021 0804   LDLCALC 116 (H) 11/04/2023 0915   LDLDIRECT 173.0 03/21/2015 0904   Hepatic Function Panel     Component Value Date/Time   PROT 7.1 06/16/2023 0833   ALBUMIN 4.6 06/16/2023 0833   AST 23 06/16/2023 0833   ALT 23 06/16/2023 0833   ALKPHOS 54 06/16/2023 0833   BILITOT 0.5 06/16/2023 0833   BILIDIR 0.0 09/15/2015 1542      Component Value Date/Time   TSH 2.630 06/16/2023 0833   Nutritional Lab Results  Component Value Date   VD25OH 45.7 06/16/2023     ASSESSMENT AND PLAN  TREATMENT PLAN FOR OBESITY:  Recommended Dietary Goals  Tzvi is currently in the action stage of change. As such, his goal is to continue weight management plan. He has agreed to the Category 3 Plan.  Behavioral Intervention  We discussed the following Behavioral Modification Strategies today: increasing lean protein intake to established goals, avoiding skipping meals, decreasing eating out or consumption of processed foods, and making healthy choices when eating convenient foods, celebration eating strategies- one plate with protein as largest portion, clean vegetable and then portion of anything she wants to try but food cannot touch on the plate, be mindful and enjoy your food  , continue to work on maintaining a reduced calorie state, getting the recommended amount of protein, incorporating whole foods, making healthy choices, staying well hydrated and  practicing mindfulness when eating., and increase protein intake, fibrous foods (25 grams per day for women, 30 grams for men) and water to improve satiety and decrease hunger signals. Stressed the importance of increasing protein to 100 grams a day and decreasing simple carbs- increase fiber    Recommended Physical Activity Goals  Yousof has been advised to work up to 150 minutes of moderate intensity aerobic activity a week and strengthening exercises 2-3 times per week for cardiovascular health, weight loss maintenance and preservation of muscle mass.   He has agreed to Continue current level of physical activity , Think about enjoyable ways to increase daily physical activity and overcoming barriers to exercise, and Increase physical activity in their day and reduce sedentary time (increase NEAT).   Pharmacotherapy  We discussed various medication options to help Jahfari with his weight loss efforts and we both agreed to continue Lomaira  8 mg every day for weight loss, denies side effects and Metformin  500 mg 2 tabs QAM for prediabetes- denies side effects  ASSOCIATED CONDITIONS ADDRESSED TODAY  Action/Plan  Essential hypertension Continue Lisinopril  40 mg every day and hydrochlorothiazide  25 mg every day Continue Category 3 meal plan  and DASH diet Monitor BP and if consistently >140/90 notify PCP If develops headaches, chest pain, shortness of breath or dizziness go to ER Continue to follow regularly with PCP Dr. De [place]  Prediabetes Start Category 3  meal plan, limit simple carbohydrates Continue Metformin  500 mg 2 tabs QAM Continue current exercise -     metFORMIN  HCl; Take 2 tablets (1,000 mg total) by mouth daily with breakfast.  Dispense: 180 tablet; Refill: 0  Hyperlipidemia, unspecified hyperlipidemia type Focus on implementing category 3 meal plan, limit saturated fats Continue Rosuvastatin  10 mg every day, denies side effects Follow regularly with PCP Continue  current exercise  Overweight with body mass index (BMI) of 29 to 29.9 in adult See plan above Advised will not increase Phentermine  dosage, needs to increase fiber and protein in his diet I have consulted the [place] Controlled Substances Registry for this patient, and feel the risk/benefit ratio today is favorable for proceeding with this prescription for a controlled substance. No aberrancies noted.  -     Phentermine  HCl; 1 tab daily by mouth  Dispense: 30 tablet; Refill: 0         Return in about 4 weeks (around 02/02/2024).SABRA He was informed of the importance of frequent follow up visits to maximize his success with intensive lifestyle modifications for his multiple health conditions.   ATTESTASTION STATEMENTS:  Reviewed by clinician on day of visit: allergies, medications, problem list, medical history, surgical history, family history, social history, and previous encounter notes.   I personally spent a total of 33 minutes in the care of the patient today including preparing to see the patient, getting/reviewing separately obtained history, performing a medically appropriate exam/evaluation, counseling and educating, placing orders, and documenting clinical information in the EHR.   Sorayah Schrodt ANP-C

## 2024-01-26 DIAGNOSIS — K080 Exfoliation of teeth due to systemic causes: Secondary | ICD-10-CM | POA: Diagnosis not present

## 2024-02-02 ENCOUNTER — Encounter (INDEPENDENT_AMBULATORY_CARE_PROVIDER_SITE_OTHER): Payer: Self-pay | Admitting: Nurse Practitioner

## 2024-02-02 ENCOUNTER — Ambulatory Visit (INDEPENDENT_AMBULATORY_CARE_PROVIDER_SITE_OTHER): Admitting: Nurse Practitioner

## 2024-02-02 VITALS — BP 111/64 | HR 69 | Temp 98.1°F | Ht 68.0 in | Wt 189.0 lb

## 2024-02-02 DIAGNOSIS — I10 Essential (primary) hypertension: Secondary | ICD-10-CM | POA: Diagnosis not present

## 2024-02-02 DIAGNOSIS — Z6828 Body mass index (BMI) 28.0-28.9, adult: Secondary | ICD-10-CM | POA: Diagnosis not present

## 2024-02-02 DIAGNOSIS — R7303 Prediabetes: Secondary | ICD-10-CM

## 2024-02-02 DIAGNOSIS — E663 Overweight: Secondary | ICD-10-CM

## 2024-02-02 MED ORDER — PHENTERMINE HCL 8 MG PO TABS: ORAL_TABLET | ORAL | 0 refills | Status: DC

## 2024-02-02 NOTE — Progress Notes (Signed)
 Office: [phone]  /  Fax: [phone]  WEIGHT SUMMARY AND BIOMETRICS  Weight Lost Since Last Visit: 4 lb  Weight Gained Since Last Visit: 0   Vitals Temp: 98.1 F (36.7 C) BP: 111/64 Pulse Rate: 69 SpO2: 97 %   Anthropometric Measurements Height: 5' 8 (1.727 m) Weight: 189 lb (85.7 kg) BMI (Calculated): 28.74 Weight at Last Visit: 193 lb Weight Lost Since Last Visit: 4 lb Weight Gained Since Last Visit: 0 Starting Weight: 217 Total Weight Loss (lbs): 28 lb (12.7 kg)   Body Composition  Body Fat %: 26 % Fat Mass (lbs): 49.2 lbs Muscle Mass (lbs): 132.8 lbs Total Body Water (lbs): 93.8 lbs Visceral Fat Rating : 15   Other Clinical Data Fasting: yes Labs: no Today's Visit #: 7 Starting Date: 06/16/23    Total Weight Loss: 28 pounds Percent of body weight lost:13%   Bio Impedance Data reviewed with patient: Muscle is down 1.2 pounds, adipose is down 2.8 pounds.   HPI  Chief Complaint: OBESITY  Eric Delgado is here to discuss his progress with his obesity treatment plan. He is on the the Category 3 Plan and states he is following his eating plan approximately 80 % of the time. He states he is very active in holiday representative and include resistance/strength training 10 hours 4 days a week, also does a lot of active work around his home when off work. He has added some walking and weights 10-20 minutes 2 days a week   Interval History:  Since last office visit he is getting most of his 90% of the time and has been getting 96 ounces of water a day He is getting more fresh fruits and vegetables . He has been doing frozen steamer vegetables.  Pharmacotherapy for weight loss: He is currently taking Phentermine  for medical weight loss.  Denies side effects.    Eric Delgado does have hypertension and his BP's are currently well controlled on Lisinopril  40 mg every day and HCTZ 25 mg every day. Denies headaches, chest pain shortness of breath at rest and dizziness  BP Readings  from Last 3 Encounters:  02/02/24 111/64  01/05/24 121/62  12/08/23 (!) 126/54   Eric Delgado on Metformin  500 mg 2 tabs QAM for prediabetes and denies side effects with the medication.   PHYSICAL EXAM:  Blood pressure 111/64, pulse 69, temperature 98.1 F (36.7 C), height 5' 8 (1.727 m), weight 189 lb (85.7 kg), SpO2 97%. Body mass index is 28.74 kg/m.  General: Well Developed, well nourished, and in no acute distress.  HEENT: Normocephalic, atraumatic; EOMI, sclerae are anicteric. Skin: Warm and dry, good turgor Chest:  Normal excursion, shape, no gross ABN Respiratory: No conversational dyspnea; speaking in full sentences NeuroM-Sk:  Normal gross ROM * 4 extremities  Psych: A and O X 3, insight adequate, mood- full    DIAGNOSTIC DATA REVIEWED:  BMET    Component Value Date/Time   NA 135 11/04/2023 0915   K 5.0 11/04/2023 0915   CL 97 11/04/2023 0915   CO2 23 11/04/2023 0915   GLUCOSE 95 11/04/2023 0915   GLUCOSE 107 (H) 04/23/2021 0804   BUN 31 (H) 11/04/2023 0915   CREATININE 1.28 (H) 11/04/2023 0915   CALCIUM  9.7 11/04/2023 0915   GFRNONAA >60 05/21/2020 0612   GFRAA >90 12/22/2013 0544   Lab Results  Component Value Date   HGBA1C 5.5 11/04/2023   HGBA1C 5.7 (H) 05/17/2022   Lab Results  Component Value Date   INSULIN  14.8 06/16/2023  Lab Results  Component Value Date   TSH 2.630 06/16/2023   CBC    Component Value Date/Time   WBC 5.9 11/04/2023 0915   WBC 13.4 (H) 05/21/2020 0612   RBC 4.42 11/04/2023 0915   RBC 3.90 (L) 05/21/2020 0612   HGB 13.1 11/04/2023 0915   HCT 39.8 11/04/2023 0915   PLT 242 11/04/2023 0915   MCV 90 11/04/2023 0915   MCH 29.6 11/04/2023 0915   MCH 30.3 05/21/2020 0612   MCHC 32.9 11/04/2023 0915   MCHC 34.9 05/21/2020 0612   RDW 13.7 11/04/2023 0915   Iron Studies No results found for: IRON, TIBC, FERRITIN, IRONPCTSAT Lipid Panel     Component Value Date/Time   CHOL 195 11/04/2023 0915   TRIG 64  11/04/2023 0915   HDL 67 11/04/2023 0915   CHOLHDL 2.9 11/04/2023 0915   CHOLHDL 3 04/23/2021 0804   VLDL 27.0 04/23/2021 0804   LDLCALC 116 (H) 11/04/2023 0915   LDLDIRECT 173.0 03/21/2015 0904   Hepatic Function Panel     Component Value Date/Time   PROT 7.1 06/16/2023 0833   ALBUMIN 4.6 06/16/2023 0833   AST 23 06/16/2023 0833   ALT 23 06/16/2023 0833   ALKPHOS 54 06/16/2023 0833   BILITOT 0.5 06/16/2023 0833   BILIDIR 0.0 09/15/2015 1542      Component Value Date/Time   TSH 2.630 06/16/2023 0833   Nutritional Lab Results  Component Value Date   VD25OH 45.7 06/16/2023     ASSESSMENT AND PLAN  TREATMENT PLAN FOR OBESITY:  Recommended Dietary Goals  Eric Delgado is currently in the action stage of change. As such, his goal is to continue weight management plan. He has agreed to the Category 3 Plan. Discussed weight goal of 180. Will try to increase his protein to increase muscle mass and prevent loss  Behavioral Intervention  We discussed the following Behavioral Modification Strategies today: increasing lean protein intake to established goals, work on meal planning and preparation, better snacking choices, celebration eating strategies, continue to work on maintaining a reduced calorie state, getting the recommended amount of protein, incorporating whole foods, making healthy choices, staying well hydrated and practicing mindfulness when eating., and increase protein intake, fibrous foods (25 grams per day for women, 30 grams for men) and water to improve satiety and decrease hunger signals. Eric Delgado  He wants to lose weight in December - He does  recognize that he must follow a structured plan and will eat before social situation to meet his protein goals and minimze party foods - He will give away food gifts unless they are very low calories or high protein - He will avoid calorie-containing liquids such as holiday drinks, eggnog and alcohol - He will stick to his structured  plan strictly most of the time - This strategy should help him lose 1-2 pounds in December   Recommended Physical Activity Goals  Eric Delgado has been advised to work up to 240 minutes of moderate intensity aerobic activity a week and strengthening exercises 2-3 times per week for cardiovascular health, weight loss maintenance and preservation of muscle mass.   He has agreed to Increase volume of physical activity to a goal of 240 minutes a week and Combine aerobic and strengthening exercises for efficiency and improved cardiometabolic health.   Pharmacotherapy We discussed various medication options to help Eric Delgado with his weight loss efforts and we both agreed to continue Lomaira  8 mg every day and denies side effects with medication. I have consulted the  [place] Controlled Substances Registry for this patient, and feel the risk/benefit ratio today is favorable for proceeding with this prescription for a controlled substance. No aberrancies noted. .  ASSOCIATED CONDITIONS ADDRESSED TODAY  Action/Plan  Essential hypertension Continue Lisinopril  40 mg every day and HCTZ 25 mg every day Continue Category 3 meal plan  and DASH diet Monitor BP and if consistently >140/90 notify PCP If develops headaches, chest pain, shortness of breath or dizziness go to ER Continue to follow regularly with PCP   Prediabetes Start Category 3  meal plan, limit simple carbohydrates Decreasing body weight by 10-15% can improve glucose levels Continue exercise with current goal of 150 minutes of moderate to high intensity exercise/week. Stressed importance of adding more strength training  Overweight with body mass index (BMI) of 28 to 28.9 in adult Goal weight of 180 See plan above -     Phentermine  HCl; 1 tab daily by mouth  Dispense: 30 tablet; Refill: 0         Return in about 4 weeks (around 03/01/2024).Eric Delgado He was informed of the importance of frequent follow up visits to maximize his success with intensive  lifestyle modifications for his multiple health conditions.   ATTESTASTION STATEMENTS:  Reviewed by clinician on day of visit: allergies, medications, problem list, medical history, surgical history, family history, social history, and previous encounter notes.     Eric Delgado ANP-C

## 2024-02-03 ENCOUNTER — Other Ambulatory Visit (INDEPENDENT_AMBULATORY_CARE_PROVIDER_SITE_OTHER): Payer: Self-pay | Admitting: Nurse Practitioner

## 2024-02-03 ENCOUNTER — Ambulatory Visit: Payer: Self-pay

## 2024-02-03 DIAGNOSIS — E663 Overweight: Secondary | ICD-10-CM

## 2024-02-03 NOTE — Telephone Encounter (Signed)
 FYI Only or Action Required?: Action required by provider: update on patient condition and scheduled 02/10/24 with Dr. Ozell.  Patient was last seen in primary care on 02/02/2024 by Jude Lonell BRAVO, NP.  Called Nurse Triage reporting Back Pain.  Symptoms began several months ago.  Interventions attempted: Prescription medications: meloxicam ; hydrocodone  and Rest, hydration, or home remedies.  Symptoms are: gradually worsening.  Triage Disposition: See HCP Within 4 Hours (Or PCP Triage)  Patient/caregiver understands and will follow disposition?: Yes    Copied from CRM #[id number]. Topic: Clinical - Red Word Triage >> Feb 03, 2024 10:25 AM Antwanette L wrote: Red Word that prompted transfer to Nurse Triage: The patient would like to reschedule the appointment originally set for January 2. The patient reports neck pain that has progressed to nerve pain radiating down the back, resulting in numbness in the hand. The patient also reports difficulty sleeping, with symptoms ongoing since September. >> Feb 03, 2024 12:05 PM Mercer PEDLAR wrote: Patient is calling because he has not heard back from [facility]. Transferred to [facility].  >> Feb 03, 2024 10:34 AM Antwanette L wrote: Patient is  unable to remain on hold due to long wait time. He  is requesting a callback at  [phone] Reason for Disposition  [1] SEVERE back pain (e.g., excruciating, unable to do any normal activities) AND [2] not improved 2 hours after pain medicine  Answer Assessment - Initial Assessment Questions Pt calling to rescheduled appt 02/2024 for sooner appt d/t increased back/nerve pain. Pt states he has been seeing PCP since September but pain has not improved. States he did see Ortho who recommended MRI but pt would prefer to come back to PCP as he does not like MRIs; stated if PCP felt it was necessary he would do it but scans are difficult for him. States at this time he has tried meloxicam  without relief, hydrocodone  x1 yesterday  with some relief for a couple hours. Pt states he did not feel pain was spasm related so he has not taken flexeril . Discussed no appts with PCP, scheduled within tier for sooner appt with Dr. Ozell; pt declined appt today and requested specifically 12/26. Reassured pt I would update PCP on condition. Appointment scheduled for evaluation. Patient agrees with plan of care, and will call back if anything changes, or if symptoms worsen.    1. ONSET: When did the pain begin? (e.g., minutes, hours, days)     Ongoing since September; Pt reports pulling on something at work, hearing a pop then experiencing pain immediately in shoulder. Pt states pain has just worsened since then   2. LOCATION: Where does it hurt? (upper, mid or lower back)     Back; shoulder   3. SEVERITY: How bad is the pain?  (e.g., Scale 1-10; mild, moderate, or severe)     10/10 yesterday, today improved as he is walking and working but still hurting   4. PATTERN: Is the pain constant? (e.g., yes, no; constant, intermittent)      Constant  5. RADIATION: Does the pain shoot into your legs or somewhere else?     Radiates down neck and back; numbness in hands   6. CAUSE:  What do you think is causing the back pain?      Lifting/pulling injury in September   7. BACK OVERUSE:  Any recent lifting of heavy objects, strenuous work or exercise?     Nothing current   8. MEDICINES: What have you taken so far for the  pain? (e.g., nothing, acetaminophen , NSAIDS)     Tried meloxicam  without improvement; took hydrocodone  x 1 12/18 and states pain decreased for a couple hours then was back. Pt has flexeril  but has not taken it at this time   9. NEUROLOGIC SYMPTOMS: Do you have any weakness, numbness, or problems with bowel/bladder control?     Numbness when pain radiating in hands; denies radiation to legs or change with bowel/bladder   10. OTHER SYMPTOMS: Do you have any other symptoms? (e.g., fever, abdomen pain,  burning with urination, blood in urine)       None  Protocols used: Back Pain-A-AH

## 2024-02-07 ENCOUNTER — Telehealth (INDEPENDENT_AMBULATORY_CARE_PROVIDER_SITE_OTHER): Payer: Self-pay | Admitting: *Deleted

## 2024-02-07 ENCOUNTER — Other Ambulatory Visit (INDEPENDENT_AMBULATORY_CARE_PROVIDER_SITE_OTHER): Payer: Self-pay | Admitting: *Deleted

## 2024-02-07 NOTE — Telephone Encounter (Signed)
 Prior authorization for Phentermine -8 mg has been sent to plan thru coverMymeds, waiting for their approval.

## 2024-02-10 ENCOUNTER — Ambulatory Visit: Admitting: Family Medicine

## 2024-02-10 ENCOUNTER — Encounter: Payer: Self-pay | Admitting: Family Medicine

## 2024-02-10 VITALS — BP 122/58 | HR 75 | Temp 98.3°F | Ht 68.0 in | Wt 197.3 lb

## 2024-02-10 DIAGNOSIS — S29012A Strain of muscle and tendon of back wall of thorax, initial encounter: Secondary | ICD-10-CM

## 2024-02-10 DIAGNOSIS — R200 Anesthesia of skin: Secondary | ICD-10-CM

## 2024-02-10 DIAGNOSIS — I10 Essential (primary) hypertension: Secondary | ICD-10-CM

## 2024-02-10 DIAGNOSIS — R202 Paresthesia of skin: Secondary | ICD-10-CM

## 2024-02-10 MED ORDER — DICLOFENAC SODIUM 75 MG PO TBEC: 75.0000 mg | DELAYED_RELEASE_TABLET | Freq: Two times a day (BID) | ORAL | 3 refills | Status: DC

## 2024-02-10 MED ORDER — CYCLOBENZAPRINE HCL 10 MG PO TABS: 5.0000 mg | ORAL_TABLET | Freq: Every day | ORAL | 5 refills | Status: AC

## 2024-02-10 NOTE — Progress Notes (Signed)
 "  Acute Office Visit  Subjective:     Patient ID: Eric Delgado, male    DOB: [birth year], 68 y.o.   MRN: [id number]  Chief Complaint  Patient presents with   Shoulder Pain    Patient complains of left shoulder pain since work  injury on November 03, 2023, states this office is the 3rd provider he has been seen by    Shoulder Pain   Discussed the use of AI scribe software for clinical note transcription with the patient, who gave verbal consent to proceed.  History of Present Illness   Eric Delgado is a 68 year old male who presents with persistent left shoulder pain and numbness in the left hand following a work-related injury.  He injured his left shoulder at work while pulling a large pipe from mud. The pain is persistent, aching, and burning along the edge of the scapula towards the thoracic spine. It worsens with shoulder retraction, heavy activity, and is severe enough to prevent comfortable sitting and disrupt sleep.  He has numbness and tingling in the left hand that radiates down the arm, sometimes from the elbow to the hand.  He has had x-rays but not an MRI because insurance requires a trial of physical therapy first.  He takes meloxicam  with minimal relief and has muscle relaxers that he has not tried using.  He works in the [employer] and is on [employer] duty. He is worried that heavier work worsens his symptoms and may slow recovery.        Review of Systems  All other systems reviewed and are negative.       Objective:    BP (!) 122/58   Pulse 75   Temp 98.3 F (36.8 C) (Oral)   Ht 5' 8 (1.727 m)   Wt 197 lb 4.8 oz (89.5 kg)   SpO2 96%   BMI 30.00 kg/m    Physical Exam Vitals reviewed.  Constitutional:      Appearance: Normal appearance. He is normal weight.  Pulmonary:     Effort: Pulmonary effort is normal.  Musculoskeletal:       Arms:     Right lower leg: No edema.     Left lower leg: No edema.     Comments:  Tenderness to palpation of the medial border and the tip of the left scapula  Neurological:     Mental Status: He is alert and oriented to person, place, and time. Mental status is at baseline.     No results found for any visits on 02/10/24.      Assessment & Plan:   Problem List Items Addressed This Visit       Unprioritized   Essential hypertension   Other Visit Diagnoses       Strain of left rhomboid muscle    -  Primary   Relevant Medications   diclofenac  (VOLTAREN ) 75 MG EC tablet   cyclobenzaprine  (FLEXERIL ) 10 MG tablet     Numbness and tingling in left hand       Relevant Orders   Ambulatory referral to Neurology     Assessment and Plan    Chronic left rhomboid muscle strain Chronic strain of the left rhomboid muscle, likely due to work-related injury from pulling heavy objects. Pain is localized to the edge of the scapula and exacerbated by pulling motions. The condition has persisted for over three months, indicating a chronic overuse injury. No surgical intervention is required. Current treatment  with meloxicam  has been ineffective. - Discontinued meloxicam . - Prescribed diclofenac  twice daily for inflammation. - Prescribed Flexeril  at bedtime for muscle relaxation. - Recommended physical therapy for rehabilitation and strengthening exercises. - Advised on the use of muscle rubs, massages, and heating pads for symptomatic relief. - Discussed potential for dry needling at physical therapy. - Advised on work accommodations to avoid re-injury.  Left upper extremity neuropathy Numbness and tingling in the left hand, extending from the shoulder to the hand, suggestive of nerve impingement. Differential diagnosis includes cervical radiculopathy, carpal tunnel syndrome, or ulnar neuropathy. Previous carpal tunnel release noted. EMG is recommended to assess for nerve damage. - Ordered EMG to assess for nerve damage. - Referred to neurologist for EMG. - Will consider  MRI of cervical spine if EMG indicates nerve impingement.  Hypertension Blood pressure readings are stable. - Continue current antihypertensive regimen. - Advised taking hydrochlorothiazide  in the morning and lisinopril  at night.        Meds ordered this encounter  Medications   diclofenac  (VOLTAREN ) 75 MG EC tablet    Sig: Take 1 tablet (75 mg total) by mouth 2 (two) times daily.    Dispense:  60 tablet    Refill:  3   cyclobenzaprine  (FLEXERIL ) 10 MG tablet    Sig: Take 0.5-1 tablets (5-10 mg total) by mouth at bedtime.    Dispense:  30 tablet    Refill:  5    Return if symptoms worsen or fail to improve.  Heron CHRISTELLA Sharper, MD   "

## 2024-02-14 ENCOUNTER — Telehealth (INDEPENDENT_AMBULATORY_CARE_PROVIDER_SITE_OTHER): Payer: Self-pay | Admitting: *Deleted

## 2024-02-14 NOTE — Telephone Encounter (Signed)
 Denied on December 24 by [employer] [place] [facility] 2017 Denied. We denied coverage for this drug because the Social Security Act permits the exclusion of certain drugs or classes of drugs from coverage under Part D. Lomaira  is a drug used for the treatment of anorexia, weight loss, or weight gain, which is one of the excluded classes of drugs listed in the Social Security Act. Because Lomaira  is excluded from coverage under this Act, as well as listed as an exclusion in your Evidence of Coverage document, we are denying your request.

## 2024-02-17 ENCOUNTER — Ambulatory Visit (HOSPITAL_BASED_OUTPATIENT_CLINIC_OR_DEPARTMENT_OTHER): Admitting: Family Medicine

## 2024-02-17 ENCOUNTER — Encounter (HOSPITAL_BASED_OUTPATIENT_CLINIC_OR_DEPARTMENT_OTHER): Payer: Self-pay | Admitting: Family Medicine

## 2024-02-17 VITALS — BP 117/57 | HR 66 | Temp 98.2°F | Resp 18 | Ht 68.0 in | Wt 196.0 lb

## 2024-02-17 DIAGNOSIS — M546 Pain in thoracic spine: Secondary | ICD-10-CM | POA: Diagnosis not present

## 2024-02-17 DIAGNOSIS — K5900 Constipation, unspecified: Secondary | ICD-10-CM | POA: Diagnosis not present

## 2024-02-17 DIAGNOSIS — R7989 Other specified abnormal findings of blood chemistry: Secondary | ICD-10-CM

## 2024-02-17 NOTE — Progress Notes (Signed)
" ° ° °  Procedures performed today:    None.  Independent interpretation of notes and tests performed by another provider:   None.  Brief History, Exam, Impression, and Recommendations:    BP (!) 117/57 (BP Location: Left Arm, Patient Position: Sitting, Cuff Size: Normal)   Pulse 66   Temp 98.2 F (36.8 C) (Oral)   Resp 18   Ht 5' 8 (1.727 m)   Wt 196 lb (88.9 kg)   SpO2 96%   BMI 29.80 kg/m   Left-sided thoracic back pain, unspecified chronicity Assessment & Plan: Patient had recent evaluation with [facility] primary care office related to issues with left shoulder pain and numbness and tingling in left hand.  As part of evaluation, he was recommended to have further testing with neurologist including EMG.  There is also consideration of MRI of cervical spine to possibly be ordered after EMG completed pending results of this.  He has not scheduled appointment as of yet to have EMG completed. Recommend that he reach out to neurology office to inquire about scheduling EMG.   Constipation, unspecified constipation type Assessment & Plan: Intermittent issues with constipation, will have periods of time where constipation can persist for multiple days.  Has tried various OTC measures, dietary changes. We did review general considerations. If it has been more than 3 days since last bowel movement, can consider utilizing laxative such as MiraLAX . From a maintenance standpoint, would also ensure that there is appropriate fiber intake, recommend 20 to 35 g daily from food and supplementing with fiber supplements as needed.  It is also important to ensure that there is appropriate water intake, recommend about 1.5 L of water per day.  Regular physical activity can be helpful with bowel movements as well.  I would also recommend attempting to have bowel movements after waking and/or within 2 hours after meals as this is when our bowels are most active which can assist with bowel movements. Can monitor  progress moving forward, if issues persist, would also consider further evaluation with GI.  He is up-to-date with colonoscopy, last completed about 4 years ago.   Elevated serum creatinine Assessment & Plan: Has had fluctuation in serum creatinine on labs with most recent labs showing slight elevation in serum creatinine.  Given this, would recommend rechecking labs to assess creatinine and monitoring for any sustained elevation in serum creatinine/reduction in eGFR.  Orders: -     Basic metabolic panel with GFR  Return in about 3 months (around 05/17/2024) for shoulder pain, constipation.   ___________________________________________ Nolie Bignell de [place], MD, ABFM, CAQSM Primary Care and Sports Medicine [facility] "

## 2024-02-21 ENCOUNTER — Encounter: Payer: Self-pay | Admitting: Family Medicine

## 2024-02-22 NOTE — Telephone Encounter (Signed)
 Can you ask [facility] to follow up on this? I thought the neurologists did the EMG's,

## 2024-03-05 ENCOUNTER — Ambulatory Visit (INDEPENDENT_AMBULATORY_CARE_PROVIDER_SITE_OTHER): Admitting: Nurse Practitioner

## 2024-03-05 ENCOUNTER — Encounter (INDEPENDENT_AMBULATORY_CARE_PROVIDER_SITE_OTHER): Payer: Self-pay | Admitting: Nurse Practitioner

## 2024-03-05 VITALS — BP 123/71 | HR 98 | Temp 98.3°F | Ht 68.0 in | Wt 189.0 lb

## 2024-03-05 DIAGNOSIS — I10 Essential (primary) hypertension: Secondary | ICD-10-CM

## 2024-03-05 DIAGNOSIS — I129 Hypertensive chronic kidney disease with stage 1 through stage 4 chronic kidney disease, or unspecified chronic kidney disease: Secondary | ICD-10-CM | POA: Diagnosis not present

## 2024-03-05 DIAGNOSIS — N182 Chronic kidney disease, stage 2 (mild): Secondary | ICD-10-CM | POA: Diagnosis not present

## 2024-03-05 DIAGNOSIS — E785 Hyperlipidemia, unspecified: Secondary | ICD-10-CM

## 2024-03-05 DIAGNOSIS — E663 Overweight: Secondary | ICD-10-CM | POA: Diagnosis not present

## 2024-03-05 DIAGNOSIS — Z125 Encounter for screening for malignant neoplasm of prostate: Secondary | ICD-10-CM | POA: Diagnosis not present

## 2024-03-05 DIAGNOSIS — Z79899 Other long term (current) drug therapy: Secondary | ICD-10-CM | POA: Diagnosis not present

## 2024-03-05 DIAGNOSIS — Z6828 Body mass index (BMI) 28.0-28.9, adult: Secondary | ICD-10-CM

## 2024-03-05 DIAGNOSIS — R7303 Prediabetes: Secondary | ICD-10-CM

## 2024-03-05 DIAGNOSIS — E559 Vitamin D deficiency, unspecified: Secondary | ICD-10-CM | POA: Diagnosis not present

## 2024-03-05 MED ORDER — PHENTERMINE HCL 8 MG PO TABS: ORAL_TABLET | ORAL | 0 refills | Status: AC

## 2024-03-05 NOTE — Progress Notes (Signed)
 " Office: [phone]  /  Fax: [phone]  WEIGHT SUMMARY AND BIOMETRICS  Weight Lost Since Last Visit: 0  Weight Gained Since Last Visit: 0   Vitals Temp: 98.3 F (36.8 C) BP: 123/71 Pulse Rate: 98 SpO2: 98 %   Anthropometric Measurements Height: 5' 8 (1.727 m) Weight: 189 lb (85.7 kg) BMI (Calculated): 28.74 Weight at Last Visit: 189 lb Weight Lost Since Last Visit: 0 Weight Gained Since Last Visit: 0 Starting Weight: 217 lb Total Weight Loss (lbs): 28 lb (12.7 kg)   Body Composition  Body Fat %: 26 % Fat Mass (lbs): 49.2 lbs Muscle Mass (lbs): 133 lbs Total Body Water (lbs): 94.2 lbs Visceral Fat Rating : 15   Other Clinical Data Fasting: yes Labs: no Today's Visit #: 8 Starting Date: 06/16/23    Total Weight Loss: 0 pounds Bio Impedance Data reviewed with patient: Muscle is down 0.2 pounds, adipose the same.   HPI  Chief Complaint: OBESITY  Edman is here to discuss his progress with his obesity treatment plan. He is on the practicing portion control and making smarter food choices, such as increasing vegetables and decreasing simple carbohydrates and states he is following his eating plan approximately 50 % of the time. He states he is exercising 10-11 minutes 2-3 days per week doing weights, he is active most days.   Interval History:  Since last office visit he has been eating a lot more off plan. He has not been skipping any meals Breakfast: Ham and eggs(1 whole egg and 2 egg whites) Snacks: orange or apple Lunch: Pimento cheese sandwich and can of progresso soup Dinner: Chicken stew Water drinking 64-80 ounces of water- drinks less water when its cold.  Drinks more coffee with monk sugar and 1% milk He does have silver sneakers now and plans to join.   [name] does have hypertension and his BP's are currently well controlled on Lisinopril  40 mg every day and hydrochlorothiazide  25 mg every day. Denies headaches, chest pain shortness of breath  at rest and dizziness  BP Readings from Last 3 Encounters:  03/05/24 123/71  02/17/24 (!) 117/57  02/10/24 (!) 122/58   He continues to use Metformin  500 mg 2 tabs daily for insulin  resistance- denies side effects with the medication Lab Results  Component Value Date   HGBA1C 5.5 11/04/2023    Pharmacotherapy for weight loss: He is currently taking Phentermine  8 mg every day for weight loss and Metformin  500 mg 2 tab daily off label for medical weight loss.  Denies side effects.       PHYSICAL EXAM:  Blood pressure 123/71, pulse 98, temperature 98.3 F (36.8 C), height 5' 8 (1.727 m), weight 189 lb (85.7 kg), SpO2 98%. Body mass index is 28.74 kg/m.  General: Well Developed, well nourished, and in no acute distress.  HEENT: Normocephalic, atraumatic; EOMI, sclerae are anicteric. Skin: Warm and dry, good turgor Chest:  Normal excursion, shape, no gross ABN Respiratory: No conversational dyspnea; speaking in full sentences NeuroM-Sk:  Normal gross ROM * 4 extremities  Psych: A and O X 3, insight adequate, mood- full    DIAGNOSTIC DATA REVIEWED:  BMET    Component Value Date/Time   NA 135 11/04/2023 0915   K 5.0 11/04/2023 0915   CL 97 11/04/2023 0915   CO2 23 11/04/2023 0915   GLUCOSE 95 11/04/2023 0915   GLUCOSE 107 (H) 04/23/2021 0804   BUN 31 (H) 11/04/2023 0915   CREATININE 1.28 (H) 11/04/2023 0915  CALCIUM  9.7 11/04/2023 0915   GFRNONAA >60 05/21/2020 0612   GFRAA >90 12/22/2013 0544   Lab Results  Component Value Date   HGBA1C 5.5 11/04/2023   HGBA1C 5.7 (H) 05/17/2022   Lab Results  Component Value Date   INSULIN  14.8 06/16/2023   Lab Results  Component Value Date   TSH 2.630 06/16/2023   CBC    Component Value Date/Time   WBC 5.9 11/04/2023 0915   WBC 13.4 (H) 05/21/2020 0612   RBC 4.42 11/04/2023 0915   RBC 3.90 (L) 05/21/2020 0612   HGB 13.1 11/04/2023 0915   HCT 39.8 11/04/2023 0915   PLT 242 11/04/2023 0915   MCV 90 11/04/2023 0915    MCH 29.6 11/04/2023 0915   MCH 30.3 05/21/2020 0612   MCHC 32.9 11/04/2023 0915   MCHC 34.9 05/21/2020 0612   RDW 13.7 11/04/2023 0915   Iron Studies No results found for: IRON, TIBC, FERRITIN, IRONPCTSAT Lipid Panel     Component Value Date/Time   CHOL 195 11/04/2023 0915   TRIG 64 11/04/2023 0915   HDL 67 11/04/2023 0915   CHOLHDL 2.9 11/04/2023 0915   CHOLHDL 3 04/23/2021 0804   VLDL 27.0 04/23/2021 0804   LDLCALC 116 (H) 11/04/2023 0915   LDLDIRECT 173.0 03/21/2015 0904   Hepatic Function Panel     Component Value Date/Time   PROT 7.1 06/16/2023 0833   ALBUMIN 4.6 06/16/2023 0833   AST 23 06/16/2023 0833   ALT 23 06/16/2023 0833   ALKPHOS 54 06/16/2023 0833   BILITOT 0.5 06/16/2023 0833   BILIDIR 0.0 09/15/2015 1542      Component Value Date/Time   TSH 2.630 06/16/2023 0833   Nutritional Lab Results  Component Value Date   VD25OH 45.7 06/16/2023     ASSESSMENT AND PLAN  TREATMENT PLAN FOR OBESITY:  Recommended Dietary Goals  Graysyn is currently in the action stage of change. As such, his goal is to continue weight management plan. He has agreed to practicing portion control and making smarter food choices, such as increasing vegetables and decreasing simple carbohydrates.  Behavioral Intervention  We discussed the following Behavioral Modification Strategies today: increasing lean protein intake to established goals, decreasing simple carbohydrates , increasing fiber rich foods, increasing water intake , keeping healthy foods at home, continue to work on maintaining a reduced calorie state, getting the recommended amount of protein, incorporating whole foods, making healthy choices, staying well hydrated and practicing mindfulness when eating., and increase protein intake, fibrous foods (25 grams per day for women, 30 grams for men) and water to improve satiety and decrease hunger signals. .  Additional resources provided today: NA  Recommended  Physical Activity Goals  Barett has been advised to work up to 150 minutes of moderate intensity aerobic activity a week and strengthening exercises 2-3 times per week for cardiovascular health, weight loss maintenance and preservation of muscle mass.   He has agreed to Think about enjoyable ways to increase daily physical activity and overcoming barriers to exercise, Increase physical activity in their day and reduce sedentary time (increase NEAT)., Start aerobic activity with a goal of 150 minutes a week at moderate intensity. , and Combine aerobic and strengthening exercises for efficiency and improved cardiometabolic health.   Pharmacotherapy We discussed various medication options to help Gaige with his weight loss efforts and we both agreed to continue Phentermine  Lomaira  8 mg every day, denies side effects . Patient was counseled on the importance of maintaining healthy lifestyle habits,  including balanced nutrition, regular physical activity, and behavioral modifications, while taking antiobesity medication.  Patient verbalized understanding that medication is an adjunct to, not a replacement for, lifestyle changes and that the long-term success and weight maintenance depend on continued adherence to these strategies.   ASSOCIATED CONDITIONS ADDRESSED TODAY  Action/Plan  Medication management Check B12 since on Metformin  -     Magnesium -     Vitamin B12  Chronic kidney disease (CKD), stage 2 Continue to push water throughout the day- try to get 80 ounces daily Limit use of Nsaids -     CBC with Differential/Platelet -     Comprehensive metabolic panel with GFR  Prediabetes Start practicing portion control and making smarter food choices, such as increasing vegetables and decreasing simple carbohydrates Continue Metformin  500 mg 2 tabs daily, monitor for side effects Continue exercise with current goal of 150 minutes of moderate to high intensity exercise/week.  -      Hemoglobin A1c -     Insulin , random -     TSH  Essential hypertension Continue Lisinopril  40 mg every day and hydrochlorothiazide  25 mg every day Continue practicing portion control and making smarter food choices, such as increasing vegetables and decreasing simple carbohydrates  and DASH diet Monitor BP and if consistently >140/90 notify PCP If develops headaches, chest pain, shortness of breath or dizziness go to ER -     CBC with Differential/Platelet -     Comprehensive metabolic panel with GFR  Hyperlipidemia, unspecified hyperlipidemia type Focus on practicing portion control and making smarter food choices, such as increasing vegetables and decreasing simple carbohydrates Continue rosuvastatin  20 mg every day, denies side effects Focus on getting 150 minutes a week of moderate to high intensity exercise  -     Lipid panel  Overweight with body mass index (BMI) of 28 to 28.9 in adult See plan above -     Phentermine  HCl; 1 tab daily by mouth  Dispense: 30 tablet; Refill: 0 -     TSH  Vitamin D  deficiency If Vit D level remains low will supplement with Ergocalciferol  50000 units once a week for 12 weeks and then recheck level.   -     VITAMIN D  25 Hydroxy (Vit-D Deficiency, Fractures)  Screening for prostate cancer -     PSA         Return in about 4 weeks (around 04/02/2024).SABRA He was informed of the importance of frequent follow up visits to maximize his success with intensive lifestyle modifications for his multiple health conditions.   ATTESTASTION STATEMENTS:  Reviewed by clinician on day of visit: allergies, medications, problem list, medical history, surgical history, family history, social history, and previous encounter notes.     Lonell Liverpool ANP-C "

## 2024-03-06 ENCOUNTER — Ambulatory Visit (INDEPENDENT_AMBULATORY_CARE_PROVIDER_SITE_OTHER): Payer: Self-pay | Admitting: Nurse Practitioner

## 2024-03-06 LAB — PSA: Prostate Specific Ag, Serum: 1.7 ng/mL (ref 0.0–4.0)

## 2024-03-06 LAB — CBC WITH DIFFERENTIAL/PLATELET
Basophils Absolute: 0 x10E3/uL (ref 0.0–0.2)
Basos: 1 %
EOS (ABSOLUTE): 0.2 x10E3/uL (ref 0.0–0.4)
Eos: 3 %
Hematocrit: 41.4 % (ref 37.5–51.0)
Hemoglobin: 13.9 g/dL (ref 13.0–17.7)
Immature Grans (Abs): 0 x10E3/uL (ref 0.0–0.1)
Immature Granulocytes: 0 %
Lymphocytes Absolute: 0.9 x10E3/uL (ref 0.7–3.1)
Lymphs: 14 %
MCH: 30 pg (ref 26.6–33.0)
MCHC: 33.6 g/dL (ref 31.5–35.7)
MCV: 89 fL (ref 79–97)
Monocytes Absolute: 0.7 x10E3/uL (ref 0.1–0.9)
Monocytes: 10 %
Neutrophils Absolute: 4.6 x10E3/uL (ref 1.4–7.0)
Neutrophils: 72 %
Platelets: 278 x10E3/uL (ref 150–450)
RBC: 4.64 x10E6/uL (ref 4.14–5.80)
RDW: 13 % (ref 11.6–15.4)
WBC: 6.4 x10E3/uL (ref 3.4–10.8)

## 2024-03-06 LAB — HEMOGLOBIN A1C
Est. average glucose Bld gHb Est-mCnc: 111 mg/dL
Hgb A1c MFr Bld: 5.5 % (ref 4.8–5.6)

## 2024-03-06 LAB — INSULIN, RANDOM: INSULIN: 10 u[IU]/mL (ref 2.6–24.9)

## 2024-03-06 LAB — COMPREHENSIVE METABOLIC PANEL WITH GFR
ALT: 22 IU/L (ref 0–44)
AST: 21 IU/L (ref 0–40)
Albumin: 4.8 g/dL (ref 3.9–4.9)
Alkaline Phosphatase: 47 IU/L (ref 47–123)
BUN/Creatinine Ratio: 23 (ref 10–24)
BUN: 23 mg/dL (ref 8–27)
Bilirubin Total: 0.5 mg/dL (ref 0.0–1.2)
CO2: 20 mmol/L (ref 20–29)
Calcium: 9.9 mg/dL (ref 8.6–10.2)
Chloride: 96 mmol/L (ref 96–106)
Creatinine, Ser: 1 mg/dL (ref 0.76–1.27)
Globulin, Total: 2.3 g/dL (ref 1.5–4.5)
Glucose: 106 mg/dL — ABNORMAL HIGH (ref 70–99)
Potassium: 5.1 mmol/L (ref 3.5–5.2)
Sodium: 131 mmol/L — ABNORMAL LOW (ref 134–144)
Total Protein: 7.1 g/dL (ref 6.0–8.5)
eGFR: 82 mL/min/1.73

## 2024-03-06 LAB — MAGNESIUM: Magnesium: 2 mg/dL (ref 1.6–2.3)

## 2024-03-06 LAB — LIPID PANEL
Chol/HDL Ratio: 2.8 ratio (ref 0.0–5.0)
Cholesterol, Total: 208 mg/dL — ABNORMAL HIGH (ref 100–199)
HDL: 73 mg/dL
LDL Chol Calc (NIH): 120 mg/dL — ABNORMAL HIGH (ref 0–99)
Triglycerides: 87 mg/dL (ref 0–149)
VLDL Cholesterol Cal: 15 mg/dL (ref 5–40)

## 2024-03-06 LAB — TSH: TSH: 3.15 u[IU]/mL (ref 0.450–4.500)

## 2024-03-06 LAB — VITAMIN D 25 HYDROXY (VIT D DEFICIENCY, FRACTURES): Vit D, 25-Hydroxy: 35.2 ng/mL (ref 30.0–100.0)

## 2024-03-14 ENCOUNTER — Ambulatory Visit: Payer: Self-pay

## 2024-03-14 ENCOUNTER — Other Ambulatory Visit (HOSPITAL_BASED_OUTPATIENT_CLINIC_OR_DEPARTMENT_OTHER): Payer: Self-pay | Admitting: Family Medicine

## 2024-03-14 NOTE — Telephone Encounter (Signed)
 FYI Only or Action Required?: FYI only for provider: appointment scheduled on 03/16/2024.  Patient was last seen in primary care on 03/05/2024 by Jude Lonell BRAVO, NP.  Called Nurse Triage reporting Shoulder Injury.  Symptoms began today.  Interventions attempted: OTC medications: ibuprofen.  Symptoms are: unchanged.  Triage Disposition: See Physician Within 24 Hours  Patient/caregiver understands and will follow disposition?: No, wishes to speak with PCP  Message from Edsel HERO sent at 03/14/2024 12:36 PM EST  Fall. Injury to right shoulder.   Reason for Disposition  [1] MODERATE pain (e.g., interferes with normal activities) AND [2] high-risk adult (e.g., age > 60 years, osteoporosis, chronic steroid use)  Answer Assessment - Initial Assessment Questions 1. MECHANISM: How did the injury happen?     Slipped on ice at work and landed on Right arm and elbow, unable to lift arm 2. ONSET: When did the injury happen? (e.g., minutes, hours ago)      Today, 0700 3. APPEARANCE of INJURY: What does the injury look like?      Not inspected 4. SEVERITY: Can you move the shoulder normally?      Limited ROM, limited strength in shoulder.  Must use left hand to move right arm 5. SIZE: For cuts, bruises, or swelling, ask: How large is it? (e.g., inches or centimeters;  entire joint)      denies 6. PAIN: Is there pain? If Yes, ask: How bad is the pain?  (Scale 0-10; or none, mild, moderate, severe)     No pain at rest, severe pain when attempting to move 7. TETANUS: For any breaks in the skin, ask: When was your last tetanus booster?     N/a 8. OTHER SYMPTOMS: Do you have any other symptoms? (e.g., loss of sensation)     denies  Protocols used: Shoulder Injury-A-AH

## 2024-03-16 ENCOUNTER — Ambulatory Visit (INDEPENDENT_AMBULATORY_CARE_PROVIDER_SITE_OTHER)

## 2024-03-16 ENCOUNTER — Ambulatory Visit (INDEPENDENT_AMBULATORY_CARE_PROVIDER_SITE_OTHER): Admitting: Family Medicine

## 2024-03-16 ENCOUNTER — Encounter (HOSPITAL_BASED_OUTPATIENT_CLINIC_OR_DEPARTMENT_OTHER): Payer: Self-pay | Admitting: Family Medicine

## 2024-03-16 ENCOUNTER — Ambulatory Visit (HOSPITAL_BASED_OUTPATIENT_CLINIC_OR_DEPARTMENT_OTHER): Payer: Self-pay | Admitting: Family Medicine

## 2024-03-16 VITALS — BP 109/66 | HR 88 | Resp 18 | Ht 68.0 in | Wt 194.0 lb

## 2024-03-16 DIAGNOSIS — M25522 Pain in left elbow: Secondary | ICD-10-CM

## 2024-03-16 DIAGNOSIS — W19XXXA Unspecified fall, initial encounter: Secondary | ICD-10-CM

## 2024-03-16 DIAGNOSIS — M25511 Pain in right shoulder: Secondary | ICD-10-CM

## 2024-03-16 NOTE — Progress Notes (Signed)
 Hi [name], Your left elbow x-ray does not show any signs of fracture.  Your x-ray of the right shoulder also just shows some mild degenerative changes from wear-and-tear.  The humeral x-ray of your right arm did show a possible fracture at the area of the elbow near the radial head and distal humerus where you are having pain.  Radiology recommends further x-rays of the right elbow.  I have also spoken to Leonce Reveal, PA with orthopedics downstairs and he has seen your x-ray and would be able to get you in with him today at [address] on the second [address] of [facility].  Please call their office to make this appointment as you do need these further x-rays and likely possible cast or splint.  [facility]:   [phone]   I recommend doing this before the long weekend due to possible closures with weather coming up.  I attempted to call you and left a voicemail, given our office is closing at 12 I will attempt to call you in a few more minutes to go over this as well.

## 2024-03-16 NOTE — Progress Notes (Unsigned)
 "     Acute Care Office Visit  Subjective:   Eric Delgado 1955-11-26 03/16/2024  Chief Complaint  Patient presents with   Shoulder Pain   Elbow Pain    HPI: Patient states he fell on the ice on ground on Wednesday falling on his left elbow and right forearm. He had left over pain medication at home and took last night without improvement. Denies hx of injuries before in the past or surgeries. He reports pain to RUE with difficulty raising his right arm due to pain and reports left elbow pain to epicondyle post fall. Denies swelling, bruising, numbness or tingling. Hx of right rotator cuff injury per patient. No LOC or hitting head with fall.   The following portions of the patient's history were reviewed and updated as appropriate: past medical history, past surgical history, family history, social history, allergies, medications, and problem list.   Patient Active Problem List   Diagnosis Date Noted   Constipation 02/17/2024   Lumbar radiculopathy 12/08/2023   Pain in left shoulder 12/08/2023   Thoracic back pain 11/11/2023   Low back pain 11/11/2023   Joint stiffness 06/24/2023   Joint swelling 06/24/2023   Hip pain 06/24/2023   Other fatigue 06/16/2023   DOE (dyspnea on exertion) 06/16/2023   BMI 33.0-33.9,adult 06/16/2023   Obesity, Beginning BMI 33.1 06/16/2023   Diverticular disease of colon 01/21/2023   Elevated serum creatinine 01/21/2023   Right knee pain 09/14/2022   Encounter for Medicare annual wellness exam 08/13/2022   Vaping nicotine dependence, tobacco product 07/30/2022   Pre-diabetes 06/01/2022   Wellness examination 05/31/2022   Exertional chest pain 05/07/2022   Alcohol use disorder 11/20/2021   Flexor tenosynovitis of finger 07/16/2021   Malignant melanoma in situ (HCC) 06/23/2021   Arthritis of hand 06/01/2021   Pain in right hand 04/13/2021   Left ear hearing loss 08/05/2020   Ventral hernia 08/05/2020   Status post left knee replacement  05/20/2020   Unilateral primary osteoarthritis, left knee 04/07/2020   Obesity, Class I, BMI 30-34.9 07/31/2016   Chronic insomnia 07/31/2016   Osteoarthritis, multiple sites 12/21/2013   Status post hip replacement, bilateral 12/21/2013   Encounter for general adult medical examination with abnormal findings 10/16/2010   Ex-smoker 10/16/2010   HLD (hyperlipidemia) 12/17/2006   Irritability 12/17/2006   Essential hypertension 12/17/2006   Carpal tunnel syndrome 12/17/2006   Past Medical History:  Diagnosis Date   Back pain    Constipation    Difficulty sleeping    takes xanax  for sleep   History of hiatal hernia    History of kidney problems    Hyperlipidemia    Hypertension    Joint pain    Osteoarthritis, multiple sites 12/21/2013   Pre-diabetes    SCIATICA, RIGHT 10/12/2007   Qualifier: Diagnosis of  By: Harlow MD, Ozell BRAVO    SOB (shortness of breath) on exertion    Past Surgical History:  Procedure Laterality Date   APPENDECTOMY  1975   CATARACT EXTRACTION Bilateral 02/2015   [facility]   COLONOSCOPY  10/2010   WNL, rpt 10 yrs (Dr. [facility])   COLONOSCOPY  04/2020   TA, SSP, diverticulosis, rpt 5 yrs ([facility])   ROTATOR CUFF REPAIR     left   TOTAL HIP ARTHROPLASTY Right Spring '12   Dr. Ernie - ceramic ball, fiber stem   TOTAL HIP ARTHROPLASTY Left 12/21/2013   Procedure: LEFT TOTAL HIP ARTHROPLASTY ANTERIOR APPROACH;  Surgeon:  Lonni CINDERELLA Poli, MD;  Location: [facility];  Service: Orthopedics;  Laterality: Left;   TOTAL KNEE ARTHROPLASTY Left 05/20/2020   Procedure: LEFT TOTAL KNEE ARTHROPLASTY;  Surgeon: Poli Lonni CINDERELLA, MD;  Location: [facility];  Service: Orthopedics;  Laterality: Left;   VASECTOMY     Family History  Problem Relation Age of Onset   Macular degeneration Mother    Hypertension Mother    Hyperlipidemia Mother    Cancer Mother 80       lung (small cell), smoker    Stroke Maternal Aunt     Diabetes Neg Hx    CAD Neg Hx    Outpatient Medications Prior to Visit  Medication Sig Dispense Refill   aspirin  81 MG chewable tablet Chew 1 tablet (81 mg total) by mouth 2 (two) times daily. 30 tablet 0   cyclobenzaprine  (FLEXERIL ) 10 MG tablet Take 0.5-1 tablets (5-10 mg total) by mouth at bedtime. 30 tablet 5   hydrochlorothiazide  (HYDRODIURIL ) 25 MG tablet Take 1 tablet by mouth once daily 90 tablet 0   lisinopril  (ZESTRIL ) 40 MG tablet Take 1 tablet by mouth once daily 90 tablet 0   metFORMIN  (GLUCOPHAGE ) 500 MG tablet Take 2 tablets (1,000 mg total) by mouth daily with breakfast. 180 tablet 0   MILK THISTLE PO Take by mouth daily.     Multiple Vitamin (MULTIVITAMIN WITH MINERALS) TABS tablet Take 1 tablet by mouth every morning.     Phentermine  HCl (LOMAIRA ) 8 MG TABS 1 tab daily by mouth 30 tablet 0   rosuvastatin  (CRESTOR ) 20 MG tablet TAKE 1 TABLET BY MOUTH AT BEDTIME 90 tablet 3   traZODone  (DESYREL ) 50 MG tablet TAKE 1/2 TO 1 (ONE-HALF TO ONE) TABLET BY MOUTH AT BEDTIME AS NEEDED FOR SLEEP 90 tablet 0   No facility-administered medications prior to visit.   Allergies[1]   ROS: A complete ROS was performed with pertinent positives/negatives noted in the HPI. The remainder of the ROS are negative.    Objective:   Today's Vitals   03/16/24 1049  BP: 109/66  Pulse: 88  Resp: 18  SpO2: 100%  Weight: 194 lb (88 kg)  Height: 5' 8 (1.727 m)  PainSc: 10-Worst pain ever  PainLoc: Shoulder    GENERAL: Well-appearing, in NAD. Well nourished.  SKIN: Pink, warm and dry. No rash, lesion, ulceration, or ecchymoses.  Head: Normocephalic. NECK: Trachea midline. Full ROM w/o pain or tenderness.  RESPIRATORY: Chest wall symmetrical. Respirations even and non-labored.  MSK: Muscle tone and strength appropriate for age. Moderate tenderness to distal right humerus with palpation and right shoulder. Limited ROM of RUE due to pain. Mild tenderness to left elbow without obvious deformity  or swelling. Full ROM present. +3 radial pulses bilaterally.  EXTREMITIES: Without clubbing, cyanosis, or edema.  NEUROLOGIC: No motor or sensory deficits. Steady, even gait. C2-C12 intact.  PSYCH/MENTAL STATUS: Alert, oriented x 3. Cooperative, appropriate mood and affect.    No results found for any visits on 03/16/24.    Assessment & Plan:  1. Fall, initial encounter (Primary) 2. Acute pain of right shoulder 3. Left elbow pain Will obtain xrays for concern of fracture given acute tenderness and pain. Recommend ice, heat, limiting activities with areas of pain and use of Tylenol  as needed. Will call patient with xray results today.   - DG Shoulder Right; Future - DG Humerus Right; Future - DG Elbow 2 Views Left; Future    No orders of the defined types were placed in  this encounter.  Lab Orders  No laboratory test(s) ordered today    Return if symptoms worsen or fail to improve.    Patient to reach out to office if new, worrisome, or unresolved symptoms arise or if no improvement in patient's condition. Patient verbalized understanding and is agreeable to treatment plan. All questions answered to patient's satisfaction.    Thersia Schuyler Stark, FNP      [1]  Allergies Allergen Reactions   Prozac  [Fluoxetine  Hcl] Other (See Comments)    Didn't feel well with this   Tramadol     Sick on Stomach + blood pressure goes up.    "

## 2024-03-16 NOTE — Patient Instructions (Addendum)
 Go to the 2nd Floor Orthopedics for xrays.

## 2024-03-19 NOTE — Progress Notes (Signed)
 Discussed results of xray with patient by phone on 03/16/2024 at 12:30pm. I spoke by Epic Chat with Leonce Reveal, PA at Orthopedics at [facility] and an appt for 3:30 pm for patient was held. Patient declined appointment and declined further care stating he would seek care after the weekend. Walk in appt to [facility] also offered by PCP. Recommendd using a sling to immobilize affected right elbow, ice, elevation, and OTC pain control. Pt verbalized understanding and verbalized  risk.

## 2024-03-20 ENCOUNTER — Ambulatory Visit (INDEPENDENT_AMBULATORY_CARE_PROVIDER_SITE_OTHER): Admitting: Student

## 2024-03-20 ENCOUNTER — Other Ambulatory Visit (HOSPITAL_BASED_OUTPATIENT_CLINIC_OR_DEPARTMENT_OTHER): Payer: Self-pay

## 2024-03-20 ENCOUNTER — Ambulatory Visit (INDEPENDENT_AMBULATORY_CARE_PROVIDER_SITE_OTHER)

## 2024-03-20 DIAGNOSIS — M25511 Pain in right shoulder: Secondary | ICD-10-CM | POA: Diagnosis not present

## 2024-03-20 DIAGNOSIS — M25521 Pain in right elbow: Secondary | ICD-10-CM | POA: Diagnosis not present

## 2024-03-20 DIAGNOSIS — R0789 Other chest pain: Secondary | ICD-10-CM | POA: Diagnosis not present

## 2024-03-20 MED ORDER — LIDOCAINE 5 % EX PTCH
1.0000 | MEDICATED_PATCH | CUTANEOUS | 0 refills | Status: AC
  Filled 2024-03-20: qty 15, 15d supply, fill #0

## 2024-03-20 NOTE — Assessment & Plan Note (Signed)
 Patient had recent evaluation with [facility] primary care office related to issues with left shoulder pain and numbness and tingling in left hand.  As part of evaluation, he was recommended to have further testing with neurologist including EMG.  There is also consideration of MRI of cervical spine to possibly be ordered after EMG completed pending results of this.  He has not scheduled appointment as of yet to have EMG completed. Recommend that he reach out to neurology office to inquire about scheduling EMG.

## 2024-03-20 NOTE — Assessment & Plan Note (Signed)
 Has had fluctuation in serum creatinine on labs with most recent labs showing slight elevation in serum creatinine.  Given this, would recommend rechecking labs to assess creatinine and monitoring for any sustained elevation in serum creatinine/reduction in eGFR.

## 2024-04-09 ENCOUNTER — Ambulatory Visit (INDEPENDENT_AMBULATORY_CARE_PROVIDER_SITE_OTHER): Admitting: Nurse Practitioner

## 2024-05-10 ENCOUNTER — Encounter: Payer: Self-pay | Admitting: Neurology
